# Patient Record
Sex: Female | Born: 1935 | Race: White | Hispanic: No | Marital: Married | Smoking: Never smoker
Health system: Southern US, Community
[De-identification: ages and names within clinical notes are randomized; demographics above are authoritative.]

## PROBLEM LIST (undated history)

## (undated) DIAGNOSIS — E079 Disorder of thyroid, unspecified: Secondary | ICD-10-CM

## (undated) DIAGNOSIS — E119 Type 2 diabetes mellitus without complications: Secondary | ICD-10-CM

## (undated) DIAGNOSIS — I10 Essential (primary) hypertension: Secondary | ICD-10-CM

## (undated) HISTORY — PX: ABDOMINAL HYSTERECTOMY: SHX81

## (undated) HISTORY — PX: BREAST BIOPSY: SHX20

---

## 2004-12-23 ENCOUNTER — Ambulatory Visit: Payer: Self-pay | Admitting: Gastroenterology

## 2005-04-30 ENCOUNTER — Ambulatory Visit: Payer: Self-pay | Admitting: Family Medicine

## 2005-06-01 ENCOUNTER — Ambulatory Visit: Payer: Self-pay | Admitting: Family Medicine

## 2006-02-03 ENCOUNTER — Ambulatory Visit: Payer: Self-pay | Admitting: Family Medicine

## 2006-06-10 ENCOUNTER — Ambulatory Visit: Payer: Self-pay | Admitting: Family Medicine

## 2006-12-06 ENCOUNTER — Ambulatory Visit: Payer: Self-pay | Admitting: Family Medicine

## 2007-01-24 ENCOUNTER — Ambulatory Visit: Payer: Self-pay | Admitting: Family Medicine

## 2007-01-27 ENCOUNTER — Inpatient Hospital Stay: Payer: Self-pay | Admitting: Internal Medicine

## 2007-01-27 ENCOUNTER — Other Ambulatory Visit: Payer: Self-pay

## 2007-03-04 ENCOUNTER — Ambulatory Visit: Payer: Self-pay | Admitting: Internal Medicine

## 2007-03-04 LAB — CONVERTED CEMR LAB
Basophils Relative: 0.6 % (ref 0.0–1.0)
Eosinophils Relative: 3 % (ref 0.0–5.0)
HCT: 38.2 % (ref 36.0–46.0)
Hemoglobin: 12.8 g/dL (ref 12.0–15.0)
Lymphocytes Relative: 25.7 % (ref 12.0–46.0)
Monocytes Absolute: 1 10*3/uL — ABNORMAL HIGH (ref 0.2–0.7)
Monocytes Relative: 12.9 % — ABNORMAL HIGH (ref 3.0–11.0)
Neutro Abs: 4.8 10*3/uL (ref 1.4–7.7)
Neutrophils Relative %: 57.8 % (ref 43.0–77.0)
RDW: 13.7 % (ref 11.5–14.6)
Sed Rate: 80 mm/hr — ABNORMAL HIGH (ref 0–25)
WBC: 8.1 10*3/uL (ref 4.5–10.5)

## 2007-03-11 ENCOUNTER — Ambulatory Visit: Payer: Self-pay | Admitting: Pulmonary Disease

## 2007-03-18 ENCOUNTER — Ambulatory Visit: Payer: Self-pay | Admitting: Internal Medicine

## 2007-04-13 ENCOUNTER — Ambulatory Visit: Payer: Self-pay | Admitting: Internal Medicine

## 2007-04-26 ENCOUNTER — Encounter: Payer: Self-pay | Admitting: Internal Medicine

## 2007-04-27 ENCOUNTER — Ambulatory Visit: Payer: Self-pay | Admitting: Internal Medicine

## 2007-06-08 ENCOUNTER — Ambulatory Visit: Payer: Self-pay | Admitting: Internal Medicine

## 2007-06-08 LAB — CONVERTED CEMR LAB
BUN: 40 mg/dL — ABNORMAL HIGH (ref 6–23)
CO2: 28 meq/L (ref 19–32)
Calcium: 10.1 mg/dL (ref 8.4–10.5)
Chloride: 103 meq/L (ref 96–112)
Creatinine, Ser: 1.5 mg/dL — ABNORMAL HIGH (ref 0.4–1.2)
GFR calc Af Amer: 44 mL/min
Glucose, Bld: 139 mg/dL — ABNORMAL HIGH (ref 70–99)

## 2007-06-22 ENCOUNTER — Ambulatory Visit: Payer: Self-pay | Admitting: Family Medicine

## 2007-06-24 ENCOUNTER — Ambulatory Visit: Payer: Self-pay | Admitting: Family Medicine

## 2007-07-20 ENCOUNTER — Ambulatory Visit: Payer: Self-pay | Admitting: Internal Medicine

## 2007-07-20 LAB — CONVERTED CEMR LAB
BUN: 34 mg/dL — ABNORMAL HIGH (ref 6–23)
CO2: 27 meq/L (ref 19–32)
Calcium: 9.2 mg/dL (ref 8.4–10.5)
Chloride: 107 meq/L (ref 96–112)
GFR calc Af Amer: 40 mL/min
Glucose, Bld: 252 mg/dL — ABNORMAL HIGH (ref 70–99)

## 2007-09-14 ENCOUNTER — Telehealth (INDEPENDENT_AMBULATORY_CARE_PROVIDER_SITE_OTHER): Payer: Self-pay | Admitting: *Deleted

## 2007-09-16 ENCOUNTER — Ambulatory Visit: Payer: Self-pay | Admitting: Internal Medicine

## 2007-10-12 DIAGNOSIS — J8410 Pulmonary fibrosis, unspecified: Secondary | ICD-10-CM | POA: Insufficient documentation

## 2007-10-12 DIAGNOSIS — I10 Essential (primary) hypertension: Secondary | ICD-10-CM | POA: Insufficient documentation

## 2007-10-12 DIAGNOSIS — J8409 Other alveolar and parieto-alveolar conditions: Secondary | ICD-10-CM | POA: Insufficient documentation

## 2007-10-12 DIAGNOSIS — R7309 Other abnormal glucose: Secondary | ICD-10-CM | POA: Insufficient documentation

## 2007-10-13 ENCOUNTER — Ambulatory Visit: Payer: Self-pay | Admitting: Internal Medicine

## 2007-10-13 LAB — CONVERTED CEMR LAB
BUN: 31 mg/dL — ABNORMAL HIGH (ref 6–23)
CO2: 23 meq/L (ref 19–32)
Calcium: 9.9 mg/dL (ref 8.4–10.5)
Chloride: 109 meq/L (ref 96–112)
GFR calc Af Amer: 40 mL/min
GFR calc non Af Amer: 33 mL/min

## 2007-12-14 ENCOUNTER — Ambulatory Visit: Payer: Self-pay | Admitting: Internal Medicine

## 2008-03-12 ENCOUNTER — Ambulatory Visit: Payer: Self-pay | Admitting: Internal Medicine

## 2008-03-13 LAB — CONVERTED CEMR LAB
Basophils Absolute: 0 10*3/uL (ref 0.0–0.1)
Basophils Relative: 0.5 % (ref 0.0–1.0)
Chloride: 105 meq/L (ref 96–112)
Creatinine, Ser: 1.6 mg/dL — ABNORMAL HIGH (ref 0.4–1.2)
Eosinophils Absolute: 0.2 10*3/uL (ref 0.0–0.7)
GFR calc non Af Amer: 33 mL/min
MCHC: 33.7 g/dL (ref 30.0–36.0)
MCV: 97.3 fL (ref 78.0–100.0)
Neutrophils Relative %: 66.2 % (ref 43.0–77.0)
Platelets: 221 10*3/uL (ref 150–400)
Potassium: 5.5 meq/L — ABNORMAL HIGH (ref 3.5–5.1)
Sed Rate: 49 mm/hr — ABNORMAL HIGH (ref 0–22)

## 2008-06-12 ENCOUNTER — Ambulatory Visit: Payer: Self-pay | Admitting: Internal Medicine

## 2008-06-12 DIAGNOSIS — E119 Type 2 diabetes mellitus without complications: Secondary | ICD-10-CM | POA: Insufficient documentation

## 2008-06-12 DIAGNOSIS — M899 Disorder of bone, unspecified: Secondary | ICD-10-CM | POA: Insufficient documentation

## 2008-06-12 DIAGNOSIS — M949 Disorder of cartilage, unspecified: Secondary | ICD-10-CM

## 2008-06-13 LAB — CONVERTED CEMR LAB
BUN: 42 mg/dL — ABNORMAL HIGH (ref 6–23)
Calcium: 9.7 mg/dL (ref 8.4–10.5)
GFR calc Af Amer: 38 mL/min
GFR calc non Af Amer: 31 mL/min
Potassium: 4.8 meq/L (ref 3.5–5.1)

## 2008-06-19 ENCOUNTER — Ambulatory Visit: Payer: Self-pay | Admitting: Internal Medicine

## 2008-07-17 ENCOUNTER — Ambulatory Visit: Payer: Self-pay | Admitting: Internal Medicine

## 2008-07-17 DIAGNOSIS — N189 Chronic kidney disease, unspecified: Secondary | ICD-10-CM | POA: Insufficient documentation

## 2008-08-16 ENCOUNTER — Ambulatory Visit: Payer: Self-pay | Admitting: Family Medicine

## 2008-09-05 ENCOUNTER — Ambulatory Visit: Payer: Self-pay | Admitting: Internal Medicine

## 2008-09-11 ENCOUNTER — Ambulatory Visit: Payer: Self-pay | Admitting: Physician Assistant

## 2008-10-17 ENCOUNTER — Ambulatory Visit: Payer: Self-pay | Admitting: Internal Medicine

## 2008-10-17 LAB — CONVERTED CEMR LAB
BUN: 28 mg/dL — ABNORMAL HIGH (ref 6–23)
Calcium: 9.5 mg/dL (ref 8.4–10.5)
Creatinine, Ser: 1.4 mg/dL — ABNORMAL HIGH (ref 0.4–1.2)
GFR calc Af Amer: 47 mL/min
Glucose, Bld: 224 mg/dL — ABNORMAL HIGH (ref 70–99)
Sodium: 136 meq/L (ref 135–145)

## 2008-11-14 ENCOUNTER — Ambulatory Visit: Payer: Self-pay | Admitting: Family Medicine

## 2008-11-29 ENCOUNTER — Ambulatory Visit: Payer: Self-pay | Admitting: Internal Medicine

## 2009-01-11 ENCOUNTER — Ambulatory Visit: Payer: Self-pay | Admitting: Internal Medicine

## 2009-02-28 IMAGING — CR DG CHEST 2V
2 series · 2 of 2 positions shown · non-contrast
Comparison: 03/12/2008

CLINICAL DATA: Follow up interstitial lung disease

CHEST - 2 VIEW

[view not recorded (1 of 2)]
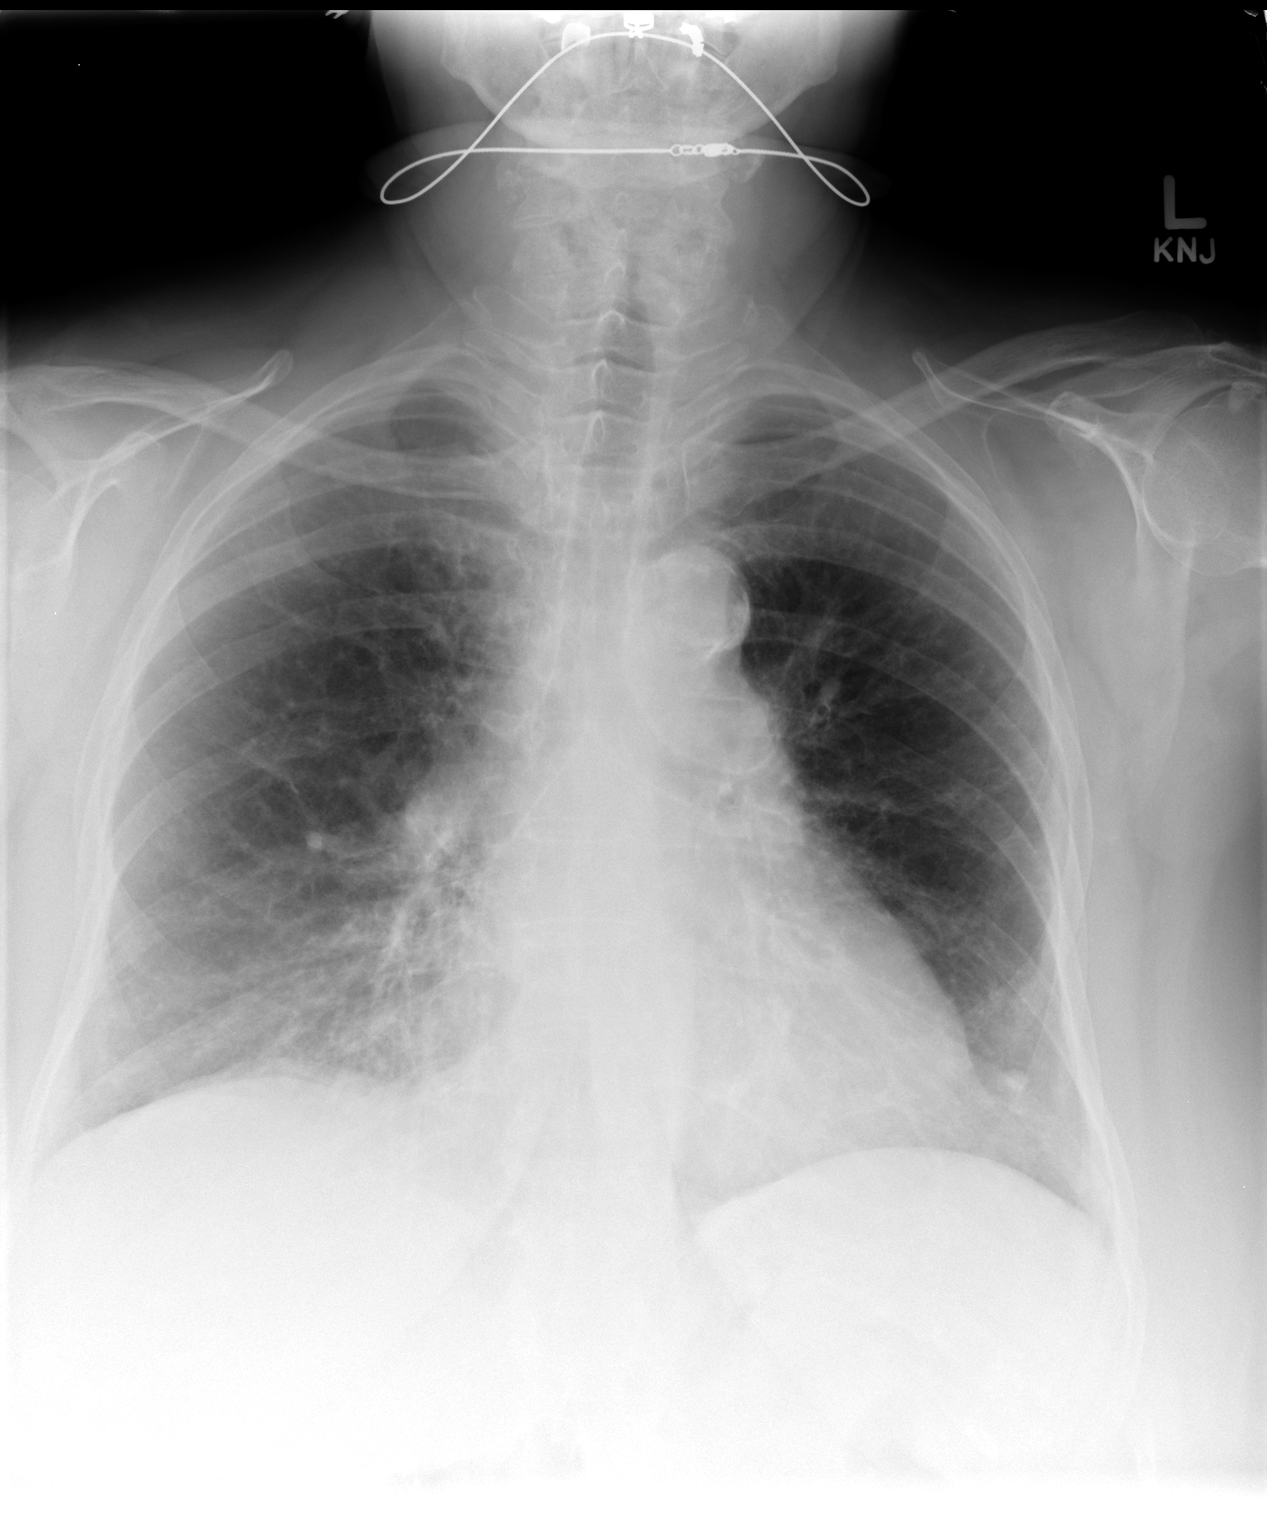

[view not recorded (2 of 2)]
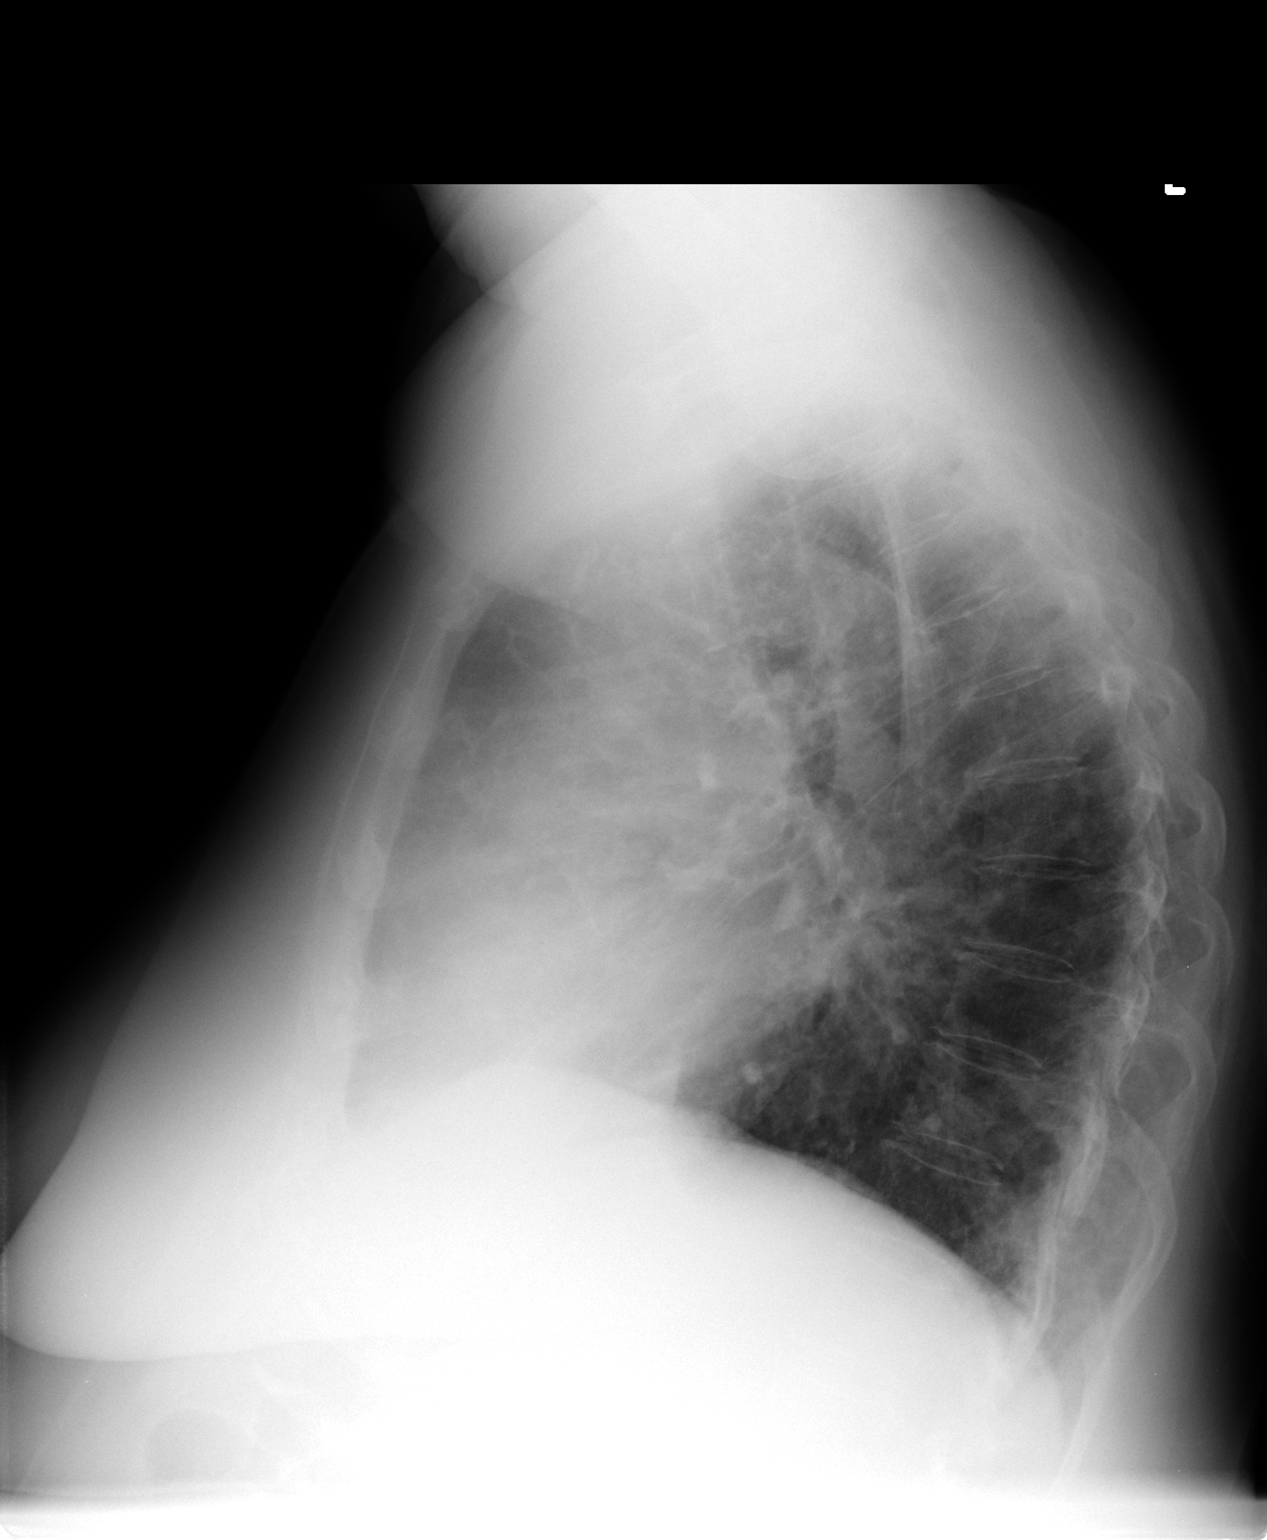

[2 of 2 positions shown; findings below may reference images not displayed]

FINDINGS: Heart size is enlarged.

There is no pleural effusion or interstitial edema.

Granuloma in the left lung base is noted.  This is similar to prior
exam.

Reticular interstitial opacities which are basilar predominant have
an appearance similar to prior exam.

No superimposed airspace consolidation noted.
IMPRESSION: 1.  No change in chronic interstitial lung disease.

REF:A1 DICTATED: 11/29/2008 [DATE]

## 2009-08-26 ENCOUNTER — Ambulatory Visit: Payer: Self-pay | Admitting: Family Medicine

## 2010-01-17 ENCOUNTER — Emergency Department: Payer: Self-pay | Admitting: Emergency Medicine

## 2010-11-11 ENCOUNTER — Ambulatory Visit: Payer: Self-pay | Admitting: Family Medicine

## 2011-03-10 NOTE — Assessment & Plan Note (Signed)
[facility]                             PULMONARY OFFICE NOTE   NAME:[name] [name]                           MRN:          [id number]  DATE:06/08/2007                            DOB:          June 08, 1929    HISTORY:  Patient is a 77 year old white female with a clinical  diagnosis of BOOP, associated with marked elevation of sed rate and a  dramatic improvement in symptoms of dyspnea and cough since started on  prednisone on Mar 12, 2007.  She has no limitations, except  intermittently her right hip slows her down.  She denies any significant  cough, dyspnea, fever, chills, sweats, orthopnea, or leg swelling.   For full list of her medications, please see face sheet, dated June 08, 2007, which correlates 100% with medication calendar she carries  with her.  She is using oxygen at bedtime only.  Her prednisone has now  been tapered down to 20 mg tablets taking a quarter daily.   PHYSICAL EXAMINATION:  She is a pleasant, ambulatory, white female with  minimal cushingoid features and no acute distress.  She is afebrile, stable vital signs.  Weight is 183 pounds, which is  only up 3 pounds from prior to prednisone use.  HEENT is unremarkable.  Oropharynx clear.  Lung fields are completely clear bilaterally to auscultation and  percussion.  There is regular rhythm without murmur, rub or gallop.  There is  increase in P2.  Abdomen is soft, benign.  Extremities are warm without calf tenderness, cyanosis, clubbing or  edema.   Hemoglobin saturation 94% on room air.   IMPRESSION:  Continued improvement, clinically, on minimum doses of  prednisone, as would be typical of BOOP, although it certainly could  flare as the prednisone is tapered off.  One complicating feature is  that she cannot exercise as much as she would like because of her  chronic right-hip pain, which may interfere with our ability to ask  about exercise tolerance.   I have reviewed  with her a reasonable strategy to taper prednisone  completely off, but for now would try one quarter tablet every other day  and return in six weeks.   Chest x-ray and sed rate are pending.     [name] Deem. Melvyn Novas, MD, [facility]  Electronically Signed    MBW/MedQ  DD: 06/08/2007  DT: 06/09/2007  Job #: [id number]   cc:   Dr. Antony Odea, [facility]

## 2011-03-10 NOTE — Assessment & Plan Note (Signed)
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  She was empirically started on prednisone on May 17, and  states that she is already feeling better with complete resolution of  cough and improving dyspnea to the point where she can make it to the  mailbox and back for the first time.  She does, however, struggle when  she takes her oxygen off.   For full information on medications, please see patient sheet, column  dated Mar 18, 2007, noting that it corresponds to the medication  calendar she carries with her which presents an unambiguous, very user  friendly medication administration record, and was generated by my nurse  practitioner on May 16.   PHYSICAL EXAMINATION:  She is a pleasant, ambulatory, obese white female  in no acute distress.  She is afebrile with normal vital signs.  HEENT:  Unremarkable.  Oropharynx clear.  Lung fields reveal a few crackles, right greater than left base.  Overall, there is much improvement in aeration.  Regular rate and rhythm without murmurs, gallops, or rubs.  ABDOMEN:  Obese but otherwise benign.  EXTREMITIES:  Warm without calf tenderness, cyanosis, clubbing, or  edema.   Heme saturation 93% on 2 L nasal prongs.   IMPRESSION:  1. Interstitial lung disease associated with crackles on exam, and      marked elevation of sed rate, classic for bronchiolitis obliterans      with organizing pneumonia.  I  spent extra time talking to the      patient and her son about the differential diagnosis and the most      likely explanation (idiopathic inflammation).  My hope is that,      based on how briskly she responded to prednisone, we can control      the disease on low doses of steroids.  The only other option is to      do a lung biopsy to justify more aggressive therapy with higher      doses of steroids.  I told her point blank that this may become      necessary if she does not continue to respond in a convincing      fashion to the empiric trial of prednisone she has presently      initiated.  2. Hypertension is well controlled having changed her medication      significantly since her previous visit to eliminate the use of      angiotensin-converting enzyme inhibitors and calcium channel      blockers.  She is  concerned about the side effects she read for      minoxidil, but for now, I believe she is doing so well on minoxidil      with no obvious side effects, that the only thing to look for would      be increasing leg swelling or hirsutism, which presently do not      apply.  If they do, I would not hesitate to rechallenge her with      calcium channel blockers in the form of Norvasc, Plendil or Sular.   Followup here will, therefore, be in 4 weeks with another sed rate and  chest x-ray, sooner if her condition deteriorates.     Christena Deem. Melvyn Novas, MD, [facility]  Electronically Signed    MBW/MedQ  DD: 03/18/2007  DT: 03/18/2007  Job #: [id number]   cc:   Gayland Curry, MD

## 2011-03-10 NOTE — Assessment & Plan Note (Signed)
[facility]                             PULMONARY OFFICE NOTE   NAME:[name] [name], [name] [name]                           MRN:          [id number]  DATE:04/27/2007                            DOB:          12-12-1928    HISTORY:  This is a 77 year old white female with a clinical diagnosis  of BOOP associated with marked elevation of sedimentation rate and now  dramatically improved, since prednisone was initiated on 5/17 to the  point where she does not feel that she needs oxygen anymore. She is able  to do pretty much what she wants to do in terms of function, although  obviously is not aerobically active.   She denies any ongoing fevers, chills, cough, orthopnea, PND, or leg  swelling.   For full information on medications, please see face sheet column dated  04/27/2007.   PHYSICAL EXAMINATION:  GENERAL:  She is a pleasant ambulatory moderately  obese white female in no acute distress.  VITAL SIGNS:  Blood pressure 138/74.  HEENT:  Normal. Oropharynx clear.  LUNGS:  Fields are perfectly clear bilaterally to auscultation and  percussion.  CARDIAC:  Regular rate and rhythm with no murmurs, rubs, or gallops.  ABDOMEN:  Soft and benign.  EXTREMITIES:  Warm without calf tenderness, cyanosis, clubbing, or  edema.   Heme saturation 96% on 2 liters, but 94% at rest. She was able to  ambulate now 3 full laps at the office (over almost 600 feet), but no  desaturation.   IMPRESSION:  1. No evidence of activity desaturation. I would like for her to      continue to wear the oxygen at night only until we are able to do      an overnight oximetry off of oxygen.  2. Her hypoxemia and interstitial lung disease associated with      elevated sedimentation rate are consistent with BOOP, albeit      without its tissue diagnosis. Therefore, we need to be very careful      that she tapers prednisone down, being on the lookout for worsening      dyspnea, cough, or desaturation.  For now, I recommended that she      try 1/2 tablet every day to see if the prednisone can be tapered      completely off at the next visit at six weeks.     Christena Deem. Melvyn Novas, MD, [facility]  Electronically Signed    MBW/MedQ  DD: 04/27/2007  DT: 04/28/2007  Job #: [id number]   cc:   Gayland Curry, MD

## 2011-03-10 NOTE — Assessment & Plan Note (Signed)
[facility]                             PULMONARY OFFICE NOTE   NAME:[name] [name], [name] [name]                           MRN:          [id number]  DATE:03/04/2007                            DOB:          1929/10/15    CHIEF COMPLAINT:  Dyspnea.   HISTORY:  A 77 year old, white female with relatively abrupt onset of  dyspnea about 6 months ago that was somewhat better after  hospitalization at [facility] for what she was told might be  pressures in her heart too high (presumably diastolic dysfunction). I  understand her cardiac workup was essentially negative however. She was  seen by Dr. Raul Del who thought she might have lupus but she did not  return for followup because he never called me back to tell me my  lupus results.   The patient denies any myalgias, arthralgias or sun sensitivity. She  does have a history of difficult to control hypertension but no history  of kidney failure to her knowledge. Classic orthopnea, PND or  significant leg swelling are not correlating with any of her complaints  of dyspnea. She also has a dry cough and chest discomfort during  coughing paroxysm.   She presently says she can walk in a grocery store as long as she has  oxygen but notes she was not oxygen-dependent before the onset of this  illness and quit smoking in 1962 felt fine then.   She denies any excess sputum production, fevers, chills, sweats, overt  sinus or reflux symptoms.   PAST MEDICAL HISTORY:  Significant for hypertension for which she is on  ACE inhibitors, diabetes, hyperlipidemia and she has had a history of  urinary tract infections but denies using Macrodantin to her knowledge.   ALLERGIES:  CODEINE and SULFA.   MEDICATIONS:  Enalapril, furosemide and nifedipine.  Please see  medication face sheet column dated Mar 04, 2007 for details. Apparently  she has tried inhalers before this but they did not help.   SOCIAL HISTORY:  She quit smoking  in 1962. She denies any unusual  travel, pet or hobby exposure.   FAMILY HISTORY:  Positive for cancer in sisters and mother, negative for  respiratory disease or atypia or rheumatologic problems.   REVIEW OF SYSTEMS:  Taken in detail on the worksheet and negative except  as outlined above.   PHYSICAL EXAMINATION:  GENERAL:  This is an obese, ambulatory, white  female in no acute distress.  VITAL SIGNS:  She had stable vital signs. She is wearing oxygen.  HEENT:  Unremarkable. Pharynx clear.  NECK:  Supple without cervical adenopathy or tenderness. The trachea is  midline, no thyromegaly.  LUNGS:  Lung fields reveal a few crackles in the right greater than left  base. Overall air movement excellent with no cough elicited on  inspiratory maneuvers.  HEART:  She has a regular rate and rhythm without murmur, gallop or rub  or increase in P2.  ABDOMEN:  Obese, soft and benign.  EXTREMITIES:  Warm without calf tenderness, cyanosis, clubbing or edema.   LABORATORY DATA:  Lab  data today indicated a sed rate of 80. CBC was  normal with no significant eosinophilia. BNP was 20.   Chest x-ray showed bilateral infiltrates, right greater than left.   IMPRESSION:  Classic bronchiolitis obliterans with organized pneumonia  by history and exam. The x-ray looks quite severe relative to the exam  but she has no clubbing and I suspect the reason she transiently  improved during her hospitalization was that she was given a short  course of prednisone, although she denies any knowledge of this.   Although she may have a component of diastolic congestive heart failure,  a BNP of 20 with a sed rate of 80 virtually rules out hydrostatic  interstitial disease in favor strongly of inflammatory lung disease. In  this setting it is not uncommon to see positive lupus and rheumatoid  factors but she has no other findings to suggest an underlying  rheumatologic disease.   RECOMMENDATIONS:  I am going to  start by trying to get a better baseline  and rapport with this patient and first address those medications that  may be contributing to her symptomatology. Specifically, I am going to  ask her to stop ACE inhibitors in favor of Benicar 40 mg 1 daily, stop  nifedipine (which interferes with hypoxic pulmonary vasoconstriction)  and switch her to minoxidil 5 mg daily (she can double it if her blood  pressure is too high, she can stop it if her blood pressure is too low)  and add Pepcid AC one in the morning and one at bedtime to suppress the  possibility of acid reflux associated with any of her problems (although  I certainly think it is unlikely here and note that she has already  taken Pepcid [facility]  and she might as well take it the best way to suppress  nocturnal acid in the setting of obesity with interstitial lung  disease).   One problem that I encountered is that the patient and her son are dead  set against prednisone because they saw her husband take it and it did  him no good. I suspect that she will have a dramatic response to  prednisone if she will allow [place] to use it. It will be difficult to  overcome this bias unless I first develop a rapport with her.   She remains O2 dependent for hypoxic respiratory failure and I have  asked her to continue this until she returns in 1 week for medication  reconciliation and 2 weeks later will see her later for  consideration for prednisone therapy. In the meantime, I will try to  obtain the records from [facility] and Dr. Gust Brooms office.     Christena Deem. Melvyn Novas, MD, [facility]  Electronically Signed    MBW/MedQ  DD: 03/04/2007  DT: 03/05/2007  Job #: [id number]   cc:   Dr. Roney Marion, [place], [place]

## 2011-03-10 NOTE — Assessment & Plan Note (Signed)
[facility]                             PULMONARY OFFICE NOTE   NAME:Quesenberry, Uriah                           MRN:          [id number]  DATE:04/13/2007                            DOB:          08-02-1929    HISTORY:  77 year-old white female with a presumptive  diagnosis of bronchiolitis that borderlines with organized pneumonia  associated with marked elevation in sed rate who is dramatically better  symptomatically with no cough or dyspnea on prednisone that has now been  tapered down to 20 mg per day.  She has found nonfasting blood sugars as  high as 600, associated with blurred vision and has increased glyburide  up to two daily on that basis.  She came in today for further  instruction.   For full list of medications please see face sheet, dated April 13, 2007.   PHYSICAL EXAMINATION:  GENERAL:  She is a pleasant, ambulatory white  female who looks actually quite good.  VITAL SIGNS:  Normal.  HEENT:  Oropharynx clear.  LUNG FIELDS:  Completely clear bilaterally by auscultation and  percussion with no crackles on inspiration or any wheezes on expiration  or coughing on either maneuver.  HEART:  Regular rhythm without murmur, gallop or rub.  ABDOMEN:  Soft, benign.  EXTREMITIES:  Warm without any calf tenderness. No clubbing, cyanosis or  edema.   IMPRESSION:  Hyperglycemia secondary to steroids.  The only blood sugar  that counts is the one that occurs before breakfast and supper.  Using a  strategy of  loose control because she is doing so much better, I plan to taper the  prednisone off.  As long as she does not become dehydrated she should do  fine on the following simplified regimen:   1. Increase glyburide to 500 mg tablets two b.i.d.  2. Continue to check blood sugars before eating breakfast, and before      eating supper and call us if over 80 or consistently greater than      400.  3. In the meantime I encouraged her to  drink plenty of non      carbohydrate containing fluids and eliminate starches and sweets      from her diet.  4. She has complained of mild visual blurring that is now resolved and      denies any overt polyuria, polydipsia, polyphagia, or nausea.     Christena Deem. Melvyn Novas, MD, [facility]  Electronically Signed    MBW/MedQ  DD: 04/13/2007  DT: 04/13/2007  Job #: [id number]   cc:   Gayland Curry, MD

## 2011-03-10 NOTE — Assessment & Plan Note (Signed)
[facility]                             PULMONARY OFFICE NOTE   NAME:[name], [name]                           MRN:          [id number]  DATE:03/11/2007                            DOB:          05-30-1929    HISTORY OF PRESENT ILLNESS:  The patient is a 77 year old white female  patient of Dr. Gustavus [name] who was recently seen for pulmonary consult for a  6 month history of abrupt onset of dyspnea.  The patient's chest x-ray  shows bilateral infiltrates, right greater than left, with an elevated  sed rate of 80 and a BNP of 20.  A CBC was essentially normal with no  significant eosinophilia.  The patient was felt to have classic  bronchiolitis obliterans with organized pneumonia.  However, patient was  very hesitant on starting steroids.  The patient was felt to also have  some upper airway instability and was discontinued off her ACE  inhibitors and given Benicar 40 mg.  She was also switched off of  nifedipine over to minoxidil and treated aggressively for reflux with  Zantac twice daily.  The patient has returned today reporting that she  continues to be quite short of breath, however is improved on continuous  oxygen therapy.  The patient has brought all of her medication in for  review.  The patient is on multiple medications and is somewhat confused  about exactly what medicines she is supposed to be taking at what time.  The patient denies any chest pain, purulent sputum, fever, chest pain,  orthopnea, PND, or leg swelling.   PAST MEDICAL HISTORY:  Reviewed.   CURRENT MEDICATIONS:  Reviewed.   PHYSICAL EXAMINATION:  The patient is a female in no acute distress, She  is afebrile with stable vital signs.  O2 saturation is 96% on 2 liters.  HEENT:  Unremarkable.  NECK:  Supple without cervical adenopathy.  No JVD.  LUNGS:  Sounds reveal a few bibasilar crackles.  CARDIAC:  Regular rate.  ABDOMEN:  Soft and nontender.  EXTREMITIES:  Warm without any  edema.   IMPRESSION AND PLAN:  1. 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  The      patient has been recommended to begin on prednisone at 40 mg,      tapering down by 10 mg every 4 days until she reaches a baseline of      20 mg daily and hold.  She will recheck here in 2 weeks with Dr.      Melvyn [name] or sooner if needed.  The patient does have a history of      diabetes mellitus, has been asked to call our office if blood      sugars get above 200.  2. Complex medication regimen.  Patient medication reviewed in detail.      Patient education      was provided.  A computerized medication calendar was completed for      this patient and reviewed in detail.      Rexene Edison, NP  Electronically Signed  Christena Deem. [name] Novas, MD, [facility]  Electronically Signed   TP/MedQ  DD: 03/15/2007  DT: 03/15/2007  Job #: [id number]

## 2011-03-10 NOTE — Assessment & Plan Note (Signed)
[facility]                             PULMONARY OFFICE NOTE   NAME:Merry, Selda                           MRN:          [id number]  DATE:07/20/2007                            DOB:          May 30, 1929    HISTORY:  A 77 year old white female with a presumed diagnosis of BOOP  dating back to the winter of 2008 and on prednisone daily since May  empirically with a marked elevated sed rate and an convincing response  to therapy.  She previously had a dry cough as well and all of her  symptoms totally resolved on prednisone which she has been able to taper  down to 20 mg tablets 1/4 every other day.   Her main concern at this point is that she is developing facial hair  from minoxidil.  She denies any significant leg swelling, decrease in  activity tolerance, fevers, chills, sweats, significant cough, chest  pain, myalgias, arthralgias except for the right hip which previously  was hurting years ago and has been hurting a little bit more since she  reduced prednisone dosage.   PHYSICAL EXAMINATION:  GENERAL:  She is a pleasant, ambulatory white  female in no acute distress.  VITAL SIGNS:  Afebrile. Normal vital signs.  HEENT:  Unremarkable.  Oropharynx is clear.  CHEST:  Lung fields are completely clear bilaterally to auscultation and  percussion with no significant pops or squeaks or crackles on  inspiration.  HEART:  Regular rhythm without murmur, gallop or rub.  ABDOMEN:  Soft.  Benign.  EXTREMITIES:  Warm without calf tenderness, cyanosis or clubbing or  edema.   LABORATORY DATA:  Hemoglobin saturation 92% on room air.  Chest x-ray is  reviewed from her previous visit in August and overall shows marked  improvement in aeration on the right.   IMPRESSION:  1. This patient clearly has a steroid responsive form of interstitial      lung disease associated with crackles and most likely had BOOP      (bronchiolitis obliterans-organized pneumonia).   She has only been      on prednisone since May but was exquisitely prednisone responsive.      I plan to repeat a sed rate today and taper her prednisone to more      physiologic dose of 2.5 mg every other day for the next six weeks      before returning here and perhaps stopping it then.  I  did advise      her to go back to the previous dose of prednisone that controlled      her symptoms should she flare as she tapers.  2. Hypertension is adequately controlled but the patient is      complaining of increased facial hair. Note that a pulse rate of 78      is not really adequate beta blockade, so I am going to increase the      Metoprolol to twice daily, ask her to reduce the minoxidil to 1/2      daily and made these changes on her  medication calendar.  3. I reviewed these recommendations in writing with her using the      medication calendar to show her how easy it is to modify the      regimen and did so in red so she could pick up the changes when she      goes home.   A BMET was obtained today because of previous studies indicated a  potassium of 5.8 on Benicar and a creatinine of 1.5, question baseline.     Christena Deem. Melvyn Novas, MD, [facility]  Electronically Signed    MBW/MedQ  DD: 07/20/2007  DT: 07/21/2007  Job #: [id number]   cc:   Maryan Puls

## 2011-03-10 NOTE — Assessment & Plan Note (Signed)
[facility]                             PULMONARY OFFICE NOTE   NAME:[name] [name], [name] [name]                           MRN:          [id number]  DATE:09/16/2007                            DOB:          1929-01-27    HISTORY OF PRESENT ILLNESS:  Patient is a 77 year old white female  patient of Dr. 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  Patient complains over the last two weeks, the  patient has had increased shortness of breath with activity, productive  cough with thick yellowish-white sputum.  Patient denies any fever,  chest pain, hemoptysis, orthopnea, PND, or leg swelling.  The patient  had been on prednisone 2.5 mg alternating every other day.  Patient  reports that once she had decreased her prednisone down to this dose,  her shortness of breath had worsened.   PAST MEDICAL HISTORY:  Reviewed.   CURRENT MEDICATIONS:  Reviewed.   PHYSICAL EXAMINATION:  Patient is a pleasant female in no acute  distress.  She is afebrile with stable vital signs.  HEENT:  Unremarkable.  NECK:  Supple without cervical adenopathy.  No JVD.  LUNGS:  The lung sounds reveal some bibasilar crackles.  CARDIAC:  Regular rate and rhythm.  ABDOMEN:  Soft, nontender.  EXTREMITIES:  Warm without any edema.   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  Patient is to increase prednisone up to 10 mg daily x5  days, decreasing down to 5 mg x5 days, then 5 mg every other day, then  hold.  Follow up in three weeks with Dr. Melvyn Novas or sooner as needed.      Rexene Edison, NP  Electronically Signed      Christena Deem. Melvyn Novas, MD, [facility]  Electronically Signed   TP/MedQ  DD: 09/16/2007  DT:  09/17/2007  Job #: [id number]

## 2011-12-18 ENCOUNTER — Ambulatory Visit: Payer: Self-pay | Admitting: Cardiology

## 2011-12-23 ENCOUNTER — Ambulatory Visit: Payer: Self-pay | Admitting: Family Medicine

## 2012-05-01 ENCOUNTER — Emergency Department: Payer: Self-pay | Admitting: Emergency Medicine

## 2012-05-01 LAB — COMPREHENSIVE METABOLIC PANEL
Anion Gap: 6 — ABNORMAL LOW (ref 7–16)
BUN: 26 mg/dL — ABNORMAL HIGH (ref 7–18)
Bilirubin,Total: 0.7 mg/dL (ref 0.2–1.0)
Chloride: 101 mmol/L (ref 98–107)
Creatinine: 1.56 mg/dL — ABNORMAL HIGH (ref 0.60–1.30)
EGFR (African American): 36 — ABNORMAL LOW
Osmolality: 283 (ref 275–301)
Potassium: 4.5 mmol/L (ref 3.5–5.1)
Total Protein: 7.2 g/dL (ref 6.4–8.2)

## 2012-05-01 LAB — URINALYSIS, COMPLETE
Bilirubin,UR: NEGATIVE
Ketone: NEGATIVE
RBC,UR: 3 /HPF (ref 0–5)
Squamous Epithelial: 5
Transitional Epi: 3
WBC UR: 83 /HPF (ref 0–5)

## 2012-05-01 LAB — CBC
HCT: 38.3 % (ref 35.0–47.0)
MCH: 31.9 pg (ref 26.0–34.0)
MCV: 99 fL (ref 80–100)
Platelet: 126 10*3/uL — ABNORMAL LOW (ref 150–440)
RDW: 17.3 % — ABNORMAL HIGH (ref 11.5–14.5)

## 2012-05-31 ENCOUNTER — Emergency Department: Payer: Self-pay | Admitting: Emergency Medicine

## 2012-05-31 LAB — COMPREHENSIVE METABOLIC PANEL
Alkaline Phosphatase: 126 U/L (ref 50–136)
BUN: 21 mg/dL — ABNORMAL HIGH (ref 7–18)
Bilirubin,Total: 1.1 mg/dL — ABNORMAL HIGH (ref 0.2–1.0)
Creatinine: 1.2 mg/dL (ref 0.60–1.30)
EGFR (African American): 49 — ABNORMAL LOW
EGFR (Non-African Amer.): 43 — ABNORMAL LOW
Glucose: 123 mg/dL — ABNORMAL HIGH (ref 65–99)
Osmolality: 274 (ref 275–301)
SGOT(AST): 52 U/L — ABNORMAL HIGH (ref 15–37)
SGPT (ALT): 15 U/L (ref 12–78)
Sodium: 135 mmol/L — ABNORMAL LOW (ref 136–145)

## 2012-05-31 LAB — CBC
HGB: 13.2 g/dL (ref 12.0–16.0)
MCH: 31.9 pg (ref 26.0–34.0)
RBC: 4.14 10*6/uL (ref 3.80–5.20)
WBC: 12.5 10*3/uL — ABNORMAL HIGH (ref 3.6–11.0)

## 2012-05-31 LAB — URINALYSIS, COMPLETE
Bilirubin,UR: NEGATIVE
Blood: NEGATIVE
Hyaline Cast: 1
Nitrite: NEGATIVE
Ph: 5 (ref 4.5–8.0)
Protein: NEGATIVE
RBC,UR: <1 /HPF (ref 0–5)

## 2013-01-11 ENCOUNTER — Ambulatory Visit: Payer: Self-pay | Admitting: Family Medicine

## 2014-03-01 ENCOUNTER — Ambulatory Visit: Payer: Self-pay | Admitting: Family Medicine

## 2015-02-08 ENCOUNTER — Other Ambulatory Visit: Payer: Self-pay | Admitting: Family Medicine

## 2015-02-08 DIAGNOSIS — Z23 Encounter for immunization: Secondary | ICD-10-CM

## 2015-03-22 ENCOUNTER — Ambulatory Visit: Payer: Self-pay | Attending: Family Medicine

## 2015-04-04 ENCOUNTER — Other Ambulatory Visit: Payer: Self-pay | Admitting: Family Medicine

## 2015-04-04 ENCOUNTER — Ambulatory Visit
Admission: RE | Admit: 2015-04-04 | Discharge: 2015-04-04 | Disposition: A | Discharge status: Home / Self Care | Payer: Medicare Other | Source: Ambulatory Visit | Attending: Family Medicine | Admitting: Family Medicine

## 2015-04-04 DIAGNOSIS — Z1231 Encounter for screening mammogram for malignant neoplasm of breast: Secondary | ICD-10-CM | POA: Insufficient documentation

## 2015-04-04 DIAGNOSIS — Z23 Encounter for immunization: Secondary | ICD-10-CM

## 2015-11-26 ENCOUNTER — Other Ambulatory Visit: Payer: Self-pay | Admitting: Ophthalmology

## 2015-11-26 DIAGNOSIS — H5340 Unspecified visual field defects: Secondary | ICD-10-CM

## 2015-11-28 ENCOUNTER — Other Ambulatory Visit: Payer: Self-pay | Admitting: Ophthalmology

## 2015-11-28 DIAGNOSIS — H5340 Unspecified visual field defects: Secondary | ICD-10-CM

## 2015-11-29 ENCOUNTER — Ambulatory Visit
Admission: RE | Admit: 2015-11-29 | Discharge: 2015-11-29 | Disposition: A | Discharge status: Home / Self Care | Payer: Medicare HMO | Source: Ambulatory Visit | Attending: Ophthalmology | Admitting: Ophthalmology

## 2015-12-17 ENCOUNTER — Ambulatory Visit
Admission: RE | Admit: 2015-12-17 | Discharge: 2015-12-17 | Disposition: A | Discharge status: Home / Self Care | Payer: Medicare HMO | Source: Ambulatory Visit | Attending: Ophthalmology | Admitting: Ophthalmology

## 2015-12-17 ENCOUNTER — Other Ambulatory Visit
Admission: AD | Admit: 2015-12-17 | Discharge: 2015-12-17 | Disposition: A | Discharge status: Home / Self Care | Payer: Medicare HMO | Source: Ambulatory Visit | Attending: Ophthalmology | Admitting: Ophthalmology

## 2015-12-17 ENCOUNTER — Other Ambulatory Visit: Payer: Self-pay | Admitting: Ophthalmology

## 2015-12-17 DIAGNOSIS — I739 Peripheral vascular disease, unspecified: Secondary | ICD-10-CM | POA: Diagnosis not present

## 2015-12-17 DIAGNOSIS — H5340 Unspecified visual field defects: Secondary | ICD-10-CM | POA: Insufficient documentation

## 2015-12-17 LAB — CREATININE, SERUM
CREATININE: 1.71 mg/dL — AB (ref 0.44–1.00)
GFR calc Af Amer: 30 mL/min — ABNORMAL LOW (ref >60)
GFR calc non Af Amer: 26 mL/min — ABNORMAL LOW (ref >60)

## 2016-02-25 ENCOUNTER — Other Ambulatory Visit: Payer: Self-pay | Admitting: Family Medicine

## 2016-02-25 DIAGNOSIS — Z1231 Encounter for screening mammogram for malignant neoplasm of breast: Secondary | ICD-10-CM

## 2016-04-20 ENCOUNTER — Other Ambulatory Visit: Payer: Self-pay | Admitting: Family Medicine

## 2016-04-20 ENCOUNTER — Ambulatory Visit
Admission: RE | Admit: 2016-04-20 | Discharge: 2016-04-20 | Disposition: A | Discharge status: Home / Self Care | Payer: Medicare HMO | Source: Ambulatory Visit | Attending: Family Medicine | Admitting: Family Medicine

## 2016-04-20 DIAGNOSIS — Z1231 Encounter for screening mammogram for malignant neoplasm of breast: Secondary | ICD-10-CM

## 2016-07-19 ENCOUNTER — Emergency Department: Payer: Medicare HMO

## 2016-07-19 ENCOUNTER — Inpatient Hospital Stay
Admission: EM | Admit: 2016-07-19 | Discharge: 2016-07-22 | DRG: 871 | Disposition: A | Discharge status: Home Health | Payer: Medicare HMO | Attending: Internal Medicine | Admitting: Internal Medicine

## 2016-07-19 DIAGNOSIS — N183 Chronic kidney disease, stage 3 (moderate): Secondary | ICD-10-CM | POA: Diagnosis present

## 2016-07-19 DIAGNOSIS — Z953 Presence of xenogenic heart valve: Secondary | ICD-10-CM

## 2016-07-19 DIAGNOSIS — I130 Hypertensive heart and chronic kidney disease with heart failure and stage 1 through stage 4 chronic kidney disease, or unspecified chronic kidney disease: Secondary | ICD-10-CM | POA: Diagnosis present

## 2016-07-19 DIAGNOSIS — I248 Other forms of acute ischemic heart disease: Secondary | ICD-10-CM | POA: Diagnosis present

## 2016-07-19 DIAGNOSIS — J45909 Unspecified asthma, uncomplicated: Secondary | ICD-10-CM | POA: Diagnosis present

## 2016-07-19 DIAGNOSIS — R531 Weakness: Secondary | ICD-10-CM | POA: Diagnosis not present

## 2016-07-19 DIAGNOSIS — J9601 Acute respiratory failure with hypoxia: Secondary | ICD-10-CM | POA: Diagnosis present

## 2016-07-19 DIAGNOSIS — N189 Chronic kidney disease, unspecified: Secondary | ICD-10-CM | POA: Diagnosis present

## 2016-07-19 DIAGNOSIS — E1122 Type 2 diabetes mellitus with diabetic chronic kidney disease: Secondary | ICD-10-CM | POA: Diagnosis present

## 2016-07-19 DIAGNOSIS — J189 Pneumonia, unspecified organism: Secondary | ICD-10-CM | POA: Diagnosis present

## 2016-07-19 DIAGNOSIS — A419 Sepsis, unspecified organism: Principal | ICD-10-CM | POA: Diagnosis present

## 2016-07-19 DIAGNOSIS — R7989 Other specified abnormal findings of blood chemistry: Secondary | ICD-10-CM

## 2016-07-19 DIAGNOSIS — K219 Gastro-esophageal reflux disease without esophagitis: Secondary | ICD-10-CM | POA: Diagnosis present

## 2016-07-19 DIAGNOSIS — N184 Chronic kidney disease, stage 4 (severe): Secondary | ICD-10-CM | POA: Diagnosis present

## 2016-07-19 DIAGNOSIS — M109 Gout, unspecified: Secondary | ICD-10-CM | POA: Diagnosis present

## 2016-07-19 DIAGNOSIS — Z7982 Long term (current) use of aspirin: Secondary | ICD-10-CM

## 2016-07-19 DIAGNOSIS — E119 Type 2 diabetes mellitus without complications: Secondary | ICD-10-CM

## 2016-07-19 DIAGNOSIS — I10 Essential (primary) hypertension: Secondary | ICD-10-CM | POA: Diagnosis present

## 2016-07-19 DIAGNOSIS — Z794 Long term (current) use of insulin: Secondary | ICD-10-CM

## 2016-07-19 DIAGNOSIS — R4182 Altered mental status, unspecified: Secondary | ICD-10-CM | POA: Diagnosis present

## 2016-07-19 DIAGNOSIS — Z882 Allergy status to sulfonamides status: Secondary | ICD-10-CM

## 2016-07-19 DIAGNOSIS — J8489 Other specified interstitial pulmonary diseases: Secondary | ICD-10-CM | POA: Diagnosis present

## 2016-07-19 DIAGNOSIS — R778 Other specified abnormalities of plasma proteins: Secondary | ICD-10-CM

## 2016-07-19 DIAGNOSIS — I5033 Acute on chronic diastolic (congestive) heart failure: Secondary | ICD-10-CM | POA: Diagnosis present

## 2016-07-19 DIAGNOSIS — R262 Difficulty in walking, not elsewhere classified: Secondary | ICD-10-CM

## 2016-07-19 DIAGNOSIS — N170 Acute kidney failure with tubular necrosis: Secondary | ICD-10-CM | POA: Diagnosis present

## 2016-07-19 DIAGNOSIS — T380X5A Adverse effect of glucocorticoids and synthetic analogues, initial encounter: Secondary | ICD-10-CM | POA: Diagnosis present

## 2016-07-19 DIAGNOSIS — M6281 Muscle weakness (generalized): Secondary | ICD-10-CM

## 2016-07-19 DIAGNOSIS — Z885 Allergy status to narcotic agent status: Secondary | ICD-10-CM

## 2016-07-19 DIAGNOSIS — E1165 Type 2 diabetes mellitus with hyperglycemia: Secondary | ICD-10-CM | POA: Diagnosis present

## 2016-07-19 LAB — URINALYSIS COMPLETE WITH MICROSCOPIC (ARMC ONLY)
Bacteria, UA: NONE SEEN
Bilirubin Urine: NEGATIVE
Glucose, UA: NEGATIVE mg/dL
KETONES UR: NEGATIVE mg/dL
Nitrite: NEGATIVE
PROTEIN: 100 mg/dL — AB
SPECIFIC GRAVITY, URINE: 1.02 (ref 1.005–1.030)
pH: 5 (ref 5.0–8.0)

## 2016-07-19 LAB — COMPREHENSIVE METABOLIC PANEL
ALBUMIN: 3.2 g/dL — AB (ref 3.5–5.0)
ALK PHOS: 67 U/L (ref 38–126)
ALT: 11 U/L — ABNORMAL LOW (ref 14–54)
ANION GAP: 11 (ref 5–15)
AST: 27 U/L (ref 15–41)
BUN: 28 mg/dL — ABNORMAL HIGH (ref 6–20)
CALCIUM: 8.9 mg/dL (ref 8.9–10.3)
CO2: 21 mmol/L — AB (ref 22–32)
Chloride: 102 mmol/L (ref 101–111)
Creatinine, Ser: 1.69 mg/dL — ABNORMAL HIGH (ref 0.44–1.00)
GFR calc Af Amer: 31 mL/min — ABNORMAL LOW (ref >60)
GFR calc non Af Amer: 26 mL/min — ABNORMAL LOW (ref >60)
GLUCOSE: 145 mg/dL — AB (ref 65–99)
POTASSIUM: 4.3 mmol/L (ref 3.5–5.1)
SODIUM: 134 mmol/L — AB (ref 135–145)
Total Bilirubin: 1 mg/dL (ref 0.3–1.2)
Total Protein: 7.5 g/dL (ref 6.5–8.1)

## 2016-07-19 LAB — TROPONIN I: Troponin I: 0.09 ng/mL (ref <0.03)

## 2016-07-19 LAB — CBC WITH DIFFERENTIAL/PLATELET
Basophils Absolute: 0.1 10*3/uL (ref 0–0.1)
Basophils Relative: 0 %
Eosinophils Absolute: 0 10*3/uL (ref 0–0.7)
Eosinophils Relative: 0 %
HEMATOCRIT: 39.5 % (ref 35.0–47.0)
Hemoglobin: 13.1 g/dL (ref 12.0–16.0)
LYMPHS ABS: 2 10*3/uL (ref 1.0–3.6)
LYMPHS PCT: 12 %
MCH: 33.2 pg (ref 26.0–34.0)
MCHC: 33.2 g/dL (ref 32.0–36.0)
MCV: 100 fL (ref 80.0–100.0)
MONO ABS: 1.6 10*3/uL — AB (ref 0.2–0.9)
MONOS PCT: 9 %
NEUTROS ABS: 14 10*3/uL — AB (ref 1.4–6.5)
Neutrophils Relative %: 79 %
Platelets: 122 10*3/uL — ABNORMAL LOW (ref 150–440)
RBC: 3.95 MIL/uL (ref 3.80–5.20)
RDW: 15.6 % — AB (ref 11.5–14.5)
WBC: 17.7 10*3/uL — ABNORMAL HIGH (ref 3.6–11.0)

## 2016-07-19 LAB — LACTIC ACID, PLASMA: Lactic Acid, Venous: 2 mmol/L (ref 0.5–1.9)

## 2016-07-19 MED ORDER — CEFTRIAXONE SODIUM 1 G IJ SOLR
1.0000 g | Freq: Once | INTRAMUSCULAR | Status: AC
  Administered 2016-07-19: 1 g via INTRAVENOUS
  Filled 2016-07-19: qty 10

## 2016-07-19 MED ORDER — AZITHROMYCIN 500 MG PO TABS
500.0000 mg | ORAL_TABLET | Freq: Once | ORAL | Status: AC
  Administered 2016-07-19: 500 mg via ORAL
  Filled 2016-07-19: qty 1

## 2016-07-19 MED ORDER — SODIUM CHLORIDE 0.9 % IV BOLUS (SEPSIS)
500.0000 mL | Freq: Once | INTRAVENOUS | Status: AC
  Administered 2016-07-19: 500 mL via INTRAVENOUS

## 2016-07-19 MED ORDER — SODIUM CHLORIDE 0.9 % IV BOLUS (SEPSIS)
1000.0000 mL | Freq: Once | INTRAVENOUS | Status: AC
  Administered 2016-07-19: 1000 mL via INTRAVENOUS

## 2016-07-19 NOTE — ED Triage Notes (Signed)
Patient to the ED with family.  Reports elevated blood sugars at home, some altered mental status, nausea and weakness.

## 2016-07-19 NOTE — ED Notes (Signed)
Pt resting quietly, cont to monitor °

## 2016-07-19 NOTE — ED Provider Notes (Addendum)
[facility] Emergency Department Provider Note  ____________________________________________  Time seen: Approximately 8:21 PM  I have reviewed the triage vital signs and the nursing notes.   HISTORY  Chief Complaint Altered Mental Status; Nausea; Weakness; and Code Sepsis   HPI [name] [name] is a 82 y.o. female h/o CKD, DM, HTN, ILD who presents for generalized weakness, cough, and fever. According to patient's son she has had a cough for 2 weeks. Son lives with patient and has had cough and congestion as well. The last three days patient started to become progressively more weak and tired. Today barely got up from bed and son noticed that she was confused about her medications that's when he decided it was time to come to the doctor. He has not noticed any fevers at home. Patient reports that she has been urinating a lot and coughing a lot at home. She denies chest pain or shortness of breath, abdominal pain, nausea or vomiting. She does endorse decreased appetite and generalized fatigue.  No past medical history on file.  Patient Active Problem List   Diagnosis Date Noted  . RENAL FAILURE, CHRONIC 07/17/2008  . DIABETES MELLITUS, TYPE II 06/12/2008  . OSTEOPENIA 06/12/2008  . HYPERTENSION 10/12/2007  . INTERSTITIAL LUNG DISEASE 10/12/2007  . OTH SPEC ALVEOL&PARIETOALVEOL PNEUMONOPATHIES 10/12/2007  . HYPERGLYCEMIA 10/12/2007    Past Surgical History:  Procedure Laterality Date  . ABDOMINAL HYSTERECTOMY    . BREAST BIOPSY Right 20+yrs ago   negative    Prior to Admission medications   Not on File    Allergies Codeine and Sulfonamide derivatives  Family History  Problem Relation Age of Onset  . Breast cancer Mother 43    Social History Social History  Substance Use Topics  . Smoking status: Not on file  . Smokeless tobacco: Not on file  . Alcohol use Not on file    Review of Systems  Constitutional: + fever, malaise, and  generalized weakness Eyes: Negative for visual changes. ENT: Negative for sore throat. Cardiovascular: Negative for chest pain. Respiratory: Negative for shortness of breath. + cough Gastrointestinal: Negative for abdominal pain, vomiting or diarrhea. Genitourinary: Negative for dysuria. + frequency Musculoskeletal: Negative for back pain. Skin: Negative for rash. Neurological: Negative for headaches, weakness or numbness.  ____________________________________________   PHYSICAL EXAM:  VITAL SIGNS: ED Triage Vitals  Enc Vitals Group     BP 07/19/16 1955 (!) 152/86     Pulse Rate 07/19/16 1955 86     Resp 07/19/16 1955 (!) 24     Temp 07/19/16 1955 (!) 101.2 F (38.4 C)     Temp Source 07/19/16 1955 Oral     SpO2 07/19/16 1955 90 %     Weight 07/19/16 1952 180 lb (81.6 kg)     Height 07/19/16 1952 5\' 4"  (1.626 m)     Head Circumference --      Peak Flow --      Pain Score --      Pain Loc --      Pain Edu? --      Excl. in [place]? --     Constitutional: Alert and oriented. Well appearing and in no apparent distress. HEENT:      Head: Normocephalic and atraumatic.         Eyes: Conjunctivae are normal. Sclera is non-icteric. EOMI. PERRL      Mouth/Throat: Mucous membranes are moist.       Neck: Supple with no signs of meningismus.  Cardiovascular: Regular rate and rhythm. No murmurs, gallops, or rubs. 2+ symmetrical distal pulses are present in all extremities. No JVD. Respiratory: Tachypneic to the mid 20s, diffuse crackles bilaterally, no wheezing, sating low 90s on RA Gastrointestinal: Soft, non tender, and non distended with positive bowel sounds. No rebound or guarding. Genitourinary: No CVA tenderness. Musculoskeletal: Nontender with normal range of motion in all extremities. No edema, cyanosis, or erythema of extremities. Neurologic: Normal speech and language. Face is symmetric. Moving all extremities. No gross focal neurologic deficits are appreciated. Skin: Skin is  warm, dry and intact. No rash noted. Psychiatric: Mood and affect are normal. Speech and behavior are normal.  ____________________________________________   LABS (all labs ordered are listed, but only abnormal results are displayed)  Labs Reviewed  COMPREHENSIVE METABOLIC PANEL - Abnormal; Notable for the following:       Result Value   Sodium 134 (*)    CO2 21 (*)    Glucose, Bld 145 (*)    BUN 28 (*)    Creatinine, Ser 1.69 (*)    Albumin 3.2 (*)    ALT 11 (*)    GFR calc non Af Amer 26 (*)    GFR calc Af Amer 31 (*)    All other components within normal limits  CBC WITH DIFFERENTIAL/PLATELET - Abnormal; Notable for the following:    WBC 17.7 (*)    RDW 15.6 (*)    Platelets 122 (*)    Neutro Abs 14.0 (*)    Monocytes Absolute 1.6 (*)    All other components within normal limits  LACTIC ACID, PLASMA - Abnormal; Notable for the following:    Lactic Acid, Venous 2.0 (*)    All other components within normal limits  TROPONIN I - Abnormal; Notable for the following:    Troponin I 0.09 (*)    All other components within normal limits  URINALYSIS COMPLETEWITH MICROSCOPIC ([facility] ONLY) - Abnormal; Notable for the following:    Color, Urine YELLOW (*)    APPearance HAZY (*)    Hgb urine dipstick 1+ (*)    Protein, ur 100 (*)    Leukocytes, UA TRACE (*)    Squamous Epithelial / LPF 0-5 (*)    All other components within normal limits  CULTURE, BLOOD (ROUTINE X 2)  CULTURE, BLOOD (ROUTINE X 2)  URINE CULTURE  LACTIC ACID, PLASMA   ____________________________________________  EKG  ED ECG REPORT I, Rudene Re, the attending physician, personally viewed and interpreted this ECG. Sinus rhythm, rate of 88, normal intervals, left axis deviation, no ST elevations or depressions. ____________________________________________  RADIOLOGY  CXR:  Diffuse interstitial coarsening, chronic and likely related to underlying interstitial lung disease. No focal  consolidation.  Mild cardiomegaly with possible mild congestive changes. ____________________________________________   PROCEDURES  Procedure(s) performed: None Procedures Critical Care performed: yes  CRITICAL CARE Performed by: Rudene Re  ?  Total critical care time: 35 min  Critical care time was exclusive of separately billable procedures and treating other patients.  Critical care was necessary to treat or prevent imminent or life-threatening deterioration.  Critical care was time spent personally by me on the following activities: development of treatment plan with patient and/or surrogate as well as nursing, discussions with consultants, evaluation of patient's response to treatment, examination of patient, obtaining history from patient or surrogate, ordering and performing treatments and interventions, ordering and review of laboratory studies, ordering and review of radiographic studies, pulse oximetry and re-evaluation of patient's condition.  ____________________________________________  INITIAL IMPRESSION / ASSESSMENT AND PLAN / ED COURSE   82 y.o. female h/o CKD, DM, HTN, ILD who presents for generalized weakness, cough, and fever x3 days. Patient with tachypnea, sating in the low 90s on RA, diffuse bilateral crackles, and productive cough. Also with urinary frequency. Patient currently meets SIRS criteria with potential sources being PNA vs UTI. Sepsis   ProtocolInitiated. We'll hold off on antibiotics at this time and 2 sources identified. We'll give IV fluids, Tylenol for fever, check blood work, blood cultures, lactate, chest x-ray, UA.   Clinical Course  Comment By Time  Lactic acid of 2.0, troponin 0.09 with no evidence of ischemic changes on the EKG. UA with no evidence of infection, white count of 17.7. I do believe patient's source of infection is pneumonia because those are the symptoms that she's been having. She hasn't been hospitalized recently  and does not live in a nursing home. We'll initiate treatment with IV ceftriaxone and by mouth azithromycin. Patient is given IV fluids per sepsis protocol. We'll admit to the hospitalist service. Rudene Re, MD 09/24 2132    Pertinent labs & imaging results that were available during my care of the patient were reviewed by me and considered in my medical decision making (see chart for details).    ____________________________________________   FINAL CLINICAL IMPRESSION(S) / ED DIAGNOSES  Final diagnoses:  Sepsis, due to unspecified organism ([place])  Elevated troponin      NEW MEDICATIONS STARTED DURING THIS VISIT:  New Prescriptions   No medications on file     Note:  This document was prepared using Dragon voice recognition software and may include unintentional dictation errors.    Rudene Re, MD 07/19/16 2135    Rudene Re, MD 07/19/16 2137

## 2016-07-19 NOTE — ED Notes (Signed)
Fall bracelet placed on patient. Family at bedside. All deny need for anything at this time.

## 2016-07-20 ENCOUNTER — Inpatient Hospital Stay (HOSPITAL_COMMUNITY)
Admit: 2016-07-20 | Discharge: 2016-07-20 | Disposition: A | Discharge status: Home / Self Care | Payer: Medicare HMO | Attending: Internal Medicine | Admitting: Internal Medicine

## 2016-07-20 DIAGNOSIS — Z7982 Long term (current) use of aspirin: Secondary | ICD-10-CM | POA: Diagnosis not present

## 2016-07-20 DIAGNOSIS — J8489 Other specified interstitial pulmonary diseases: Secondary | ICD-10-CM | POA: Diagnosis present

## 2016-07-20 DIAGNOSIS — A419 Sepsis, unspecified organism: Secondary | ICD-10-CM | POA: Diagnosis present

## 2016-07-20 DIAGNOSIS — M109 Gout, unspecified: Secondary | ICD-10-CM | POA: Diagnosis present

## 2016-07-20 DIAGNOSIS — T380X5A Adverse effect of glucocorticoids and synthetic analogues, initial encounter: Secondary | ICD-10-CM | POA: Diagnosis present

## 2016-07-20 DIAGNOSIS — Z794 Long term (current) use of insulin: Secondary | ICD-10-CM | POA: Diagnosis not present

## 2016-07-20 DIAGNOSIS — J189 Pneumonia, unspecified organism: Secondary | ICD-10-CM | POA: Diagnosis present

## 2016-07-20 DIAGNOSIS — E1122 Type 2 diabetes mellitus with diabetic chronic kidney disease: Secondary | ICD-10-CM | POA: Diagnosis present

## 2016-07-20 DIAGNOSIS — Z885 Allergy status to narcotic agent status: Secondary | ICD-10-CM | POA: Diagnosis not present

## 2016-07-20 DIAGNOSIS — I509 Heart failure, unspecified: Secondary | ICD-10-CM | POA: Diagnosis not present

## 2016-07-20 DIAGNOSIS — J45909 Unspecified asthma, uncomplicated: Secondary | ICD-10-CM | POA: Diagnosis present

## 2016-07-20 DIAGNOSIS — E1165 Type 2 diabetes mellitus with hyperglycemia: Secondary | ICD-10-CM | POA: Diagnosis present

## 2016-07-20 DIAGNOSIS — I5033 Acute on chronic diastolic (congestive) heart failure: Secondary | ICD-10-CM | POA: Diagnosis present

## 2016-07-20 DIAGNOSIS — J9601 Acute respiratory failure with hypoxia: Secondary | ICD-10-CM | POA: Diagnosis present

## 2016-07-20 DIAGNOSIS — N183 Chronic kidney disease, stage 3 (moderate): Secondary | ICD-10-CM | POA: Diagnosis present

## 2016-07-20 DIAGNOSIS — R531 Weakness: Secondary | ICD-10-CM | POA: Diagnosis present

## 2016-07-20 DIAGNOSIS — K219 Gastro-esophageal reflux disease without esophagitis: Secondary | ICD-10-CM | POA: Diagnosis present

## 2016-07-20 DIAGNOSIS — R4182 Altered mental status, unspecified: Secondary | ICD-10-CM | POA: Diagnosis present

## 2016-07-20 DIAGNOSIS — Z953 Presence of xenogenic heart valve: Secondary | ICD-10-CM | POA: Diagnosis not present

## 2016-07-20 DIAGNOSIS — R7989 Other specified abnormal findings of blood chemistry: Secondary | ICD-10-CM

## 2016-07-20 DIAGNOSIS — R778 Other specified abnormalities of plasma proteins: Secondary | ICD-10-CM | POA: Diagnosis present

## 2016-07-20 DIAGNOSIS — Z882 Allergy status to sulfonamides status: Secondary | ICD-10-CM | POA: Diagnosis not present

## 2016-07-20 DIAGNOSIS — N170 Acute kidney failure with tubular necrosis: Secondary | ICD-10-CM | POA: Diagnosis present

## 2016-07-20 DIAGNOSIS — I130 Hypertensive heart and chronic kidney disease with heart failure and stage 1 through stage 4 chronic kidney disease, or unspecified chronic kidney disease: Secondary | ICD-10-CM | POA: Diagnosis present

## 2016-07-20 DIAGNOSIS — I248 Other forms of acute ischemic heart disease: Secondary | ICD-10-CM | POA: Diagnosis present

## 2016-07-20 LAB — BASIC METABOLIC PANEL
ANION GAP: 6 (ref 5–15)
BUN: 25 mg/dL — ABNORMAL HIGH (ref 6–20)
CO2: 24 mmol/L (ref 22–32)
Calcium: 8.2 mg/dL — ABNORMAL LOW (ref 8.9–10.3)
Chloride: 108 mmol/L (ref 101–111)
Creatinine, Ser: 1.4 mg/dL — ABNORMAL HIGH (ref 0.44–1.00)
GFR, EST AFRICAN AMERICAN: 39 mL/min — AB (ref >60)
GFR, EST NON AFRICAN AMERICAN: 33 mL/min — AB (ref >60)
Glucose, Bld: 126 mg/dL — ABNORMAL HIGH (ref 65–99)
POTASSIUM: 4.2 mmol/L (ref 3.5–5.1)
SODIUM: 138 mmol/L (ref 135–145)

## 2016-07-20 LAB — ECHOCARDIOGRAM COMPLETE
Height: 64 in
WEIGHTICAEL: 2992 [oz_av]

## 2016-07-20 LAB — CBC
HEMATOCRIT: 35.6 % (ref 35.0–47.0)
HEMOGLOBIN: 11.9 g/dL — AB (ref 12.0–16.0)
MCH: 33.4 pg (ref 26.0–34.0)
MCHC: 33.5 g/dL (ref 32.0–36.0)
MCV: 99.6 fL (ref 80.0–100.0)
Platelets: 100 10*3/uL — ABNORMAL LOW (ref 150–440)
RBC: 3.57 MIL/uL — AB (ref 3.80–5.20)
RDW: 15.8 % — ABNORMAL HIGH (ref 11.5–14.5)
WBC: 14.2 10*3/uL — AB (ref 3.6–11.0)

## 2016-07-20 LAB — TROPONIN I
TROPONIN I: 0.11 ng/mL — AB (ref <0.03)
Troponin I: 0.08 ng/mL (ref <0.03)
Troponin I: 0.08 ng/mL (ref <0.03)

## 2016-07-20 LAB — LACTIC ACID, PLASMA: Lactic Acid, Venous: 1 mmol/L (ref 0.5–1.9)

## 2016-07-20 LAB — EXPECTORATED SPUTUM ASSESSMENT W REFEX TO RESP CULTURE

## 2016-07-20 LAB — EXPECTORATED SPUTUM ASSESSMENT W GRAM STAIN, RFLX TO RESP C

## 2016-07-20 LAB — GLUCOSE, CAPILLARY
GLUCOSE-CAPILLARY: 102 mg/dL — AB (ref 65–99)
GLUCOSE-CAPILLARY: 128 mg/dL — AB (ref 65–99)
GLUCOSE-CAPILLARY: 327 mg/dL — AB (ref 65–99)
Glucose-Capillary: 201 mg/dL — ABNORMAL HIGH (ref 65–99)

## 2016-07-20 MED ORDER — LEVOTHYROXINE SODIUM 100 MCG PO TABS
100.0000 ug | ORAL_TABLET | Freq: Every day | ORAL | Status: DC
  Administered 2016-07-20 – 2016-07-22 (×3): 100 ug via ORAL
  Filled 2016-07-20 (×3): qty 1

## 2016-07-20 MED ORDER — ROPINIROLE HCL 0.25 MG PO TABS
1.0000 mg | ORAL_TABLET | Freq: Every day | ORAL | Status: DC
  Administered 2016-07-20 – 2016-07-21 (×2): 1 mg via ORAL
  Filled 2016-07-20 (×2): qty 4

## 2016-07-20 MED ORDER — DEXTROSE 5 % IV SOLN
1.0000 g | INTRAVENOUS | Status: DC
  Administered 2016-07-20 – 2016-07-21 (×2): 1 g via INTRAVENOUS
  Filled 2016-07-20 (×3): qty 10

## 2016-07-20 MED ORDER — INSULIN ASPART 100 UNIT/ML ~~LOC~~ SOLN
0.0000 [IU] | Freq: Three times a day (TID) | SUBCUTANEOUS | Status: DC
  Administered 2016-07-20: 1 [IU] via SUBCUTANEOUS
  Administered 2016-07-20: 3 [IU] via SUBCUTANEOUS
  Administered 2016-07-21: 7 [IU] via SUBCUTANEOUS
  Administered 2016-07-21 – 2016-07-22 (×3): 9 [IU] via SUBCUTANEOUS
  Filled 2016-07-20: qty 9
  Filled 2016-07-20: qty 7
  Filled 2016-07-20 (×2): qty 9
  Filled 2016-07-20: qty 1
  Filled 2016-07-20: qty 3

## 2016-07-20 MED ORDER — IPRATROPIUM-ALBUTEROL 0.5-2.5 (3) MG/3ML IN SOLN
3.0000 mL | Freq: Four times a day (QID) | RESPIRATORY_TRACT | Status: DC
  Administered 2016-07-20 – 2016-07-21 (×5): 3 mL via RESPIRATORY_TRACT
  Filled 2016-07-20 (×7): qty 3

## 2016-07-20 MED ORDER — HEPARIN SODIUM (PORCINE) 5000 UNIT/ML IJ SOLN
5000.0000 [IU] | Freq: Three times a day (TID) | INTRAMUSCULAR | Status: DC
  Administered 2016-07-20 (×2): 5000 [IU] via SUBCUTANEOUS
  Filled 2016-07-20 (×2): qty 1

## 2016-07-20 MED ORDER — SODIUM CHLORIDE 0.9 % IV SOLN
INTRAVENOUS | Status: DC
  Administered 2016-07-20: 03:00:00 via INTRAVENOUS

## 2016-07-20 MED ORDER — FUROSEMIDE 10 MG/ML IJ SOLN
40.0000 mg | Freq: Once | INTRAMUSCULAR | Status: AC
  Administered 2016-07-20: 40 mg via INTRAVENOUS
  Filled 2016-07-20: qty 4

## 2016-07-20 MED ORDER — ONDANSETRON HCL 4 MG PO TABS: 4.0000 mg | ORAL_TABLET | Freq: Four times a day (QID) | ORAL | Status: DC | PRN

## 2016-07-20 MED ORDER — ACETAMINOPHEN 650 MG RE SUPP: 650.0000 mg | Freq: Four times a day (QID) | RECTAL | Status: DC | PRN

## 2016-07-20 MED ORDER — AZITHROMYCIN 250 MG PO TABS
500.0000 mg | ORAL_TABLET | Freq: Every day | ORAL | Status: DC
  Administered 2016-07-20 – 2016-07-22 (×3): 500 mg via ORAL
  Filled 2016-07-20 (×3): qty 2

## 2016-07-20 MED ORDER — FUROSEMIDE 20 MG PO TABS
20.0000 mg | ORAL_TABLET | Freq: Every day | ORAL | Status: DC
  Administered 2016-07-20: 20 mg via ORAL
  Filled 2016-07-20: qty 1

## 2016-07-20 MED ORDER — HYDRALAZINE HCL 20 MG/ML IJ SOLN
10.0000 mg | Freq: Four times a day (QID) | INTRAMUSCULAR | Status: DC | PRN
  Administered 2016-07-20: 10 mg via INTRAVENOUS
  Filled 2016-07-20: qty 1

## 2016-07-20 MED ORDER — FUROSEMIDE 40 MG PO TABS
40.0000 mg | ORAL_TABLET | Freq: Every day | ORAL | Status: DC
  Administered 2016-07-21 – 2016-07-22 (×2): 40 mg via ORAL
  Filled 2016-07-20 (×2): qty 1

## 2016-07-20 MED ORDER — DOCUSATE SODIUM 100 MG PO CAPS
100.0000 mg | ORAL_CAPSULE | Freq: Two times a day (BID) | ORAL | Status: DC
  Administered 2016-07-20 – 2016-07-22 (×5): 100 mg via ORAL
  Filled 2016-07-20 (×5): qty 1

## 2016-07-20 MED ORDER — METOPROLOL SUCCINATE ER 50 MG PO TB24
50.0000 mg | ORAL_TABLET | Freq: Two times a day (BID) | ORAL | Status: DC
  Administered 2016-07-20 (×2): 50 mg via ORAL
  Filled 2016-07-20 (×2): qty 1

## 2016-07-20 MED ORDER — ENOXAPARIN SODIUM 40 MG/0.4ML ~~LOC~~ SOLN
40.0000 mg | SUBCUTANEOUS | Status: DC
  Administered 2016-07-20 – 2016-07-21 (×2): 40 mg via SUBCUTANEOUS
  Filled 2016-07-20 (×2): qty 0.4

## 2016-07-20 MED ORDER — GABAPENTIN 100 MG PO CAPS
100.0000 mg | ORAL_CAPSULE | Freq: Three times a day (TID) | ORAL | Status: DC
Start: 2016-07-20 — End: 2016-07-22
  Administered 2016-07-20 – 2016-07-22 (×6): 100 mg via ORAL
  Filled 2016-07-20 (×6): qty 1

## 2016-07-20 MED ORDER — PRAVASTATIN SODIUM 40 MG PO TABS
80.0000 mg | ORAL_TABLET | Freq: Every day | ORAL | Status: DC
  Administered 2016-07-20 – 2016-07-21 (×2): 80 mg via ORAL
  Filled 2016-07-20 (×2): qty 2

## 2016-07-20 MED ORDER — GLUCERNA SHAKE PO LIQD
237.0000 mL | Freq: Three times a day (TID) | ORAL | Status: DC
  Administered 2016-07-20 – 2016-07-21 (×2): 237 mL via ORAL

## 2016-07-20 MED ORDER — METHYLPREDNISOLONE SODIUM SUCC 40 MG IJ SOLR
40.0000 mg | Freq: Two times a day (BID) | INTRAMUSCULAR | Status: DC
  Administered 2016-07-20 – 2016-07-22 (×5): 40 mg via INTRAVENOUS
  Filled 2016-07-20 (×5): qty 1

## 2016-07-20 MED ORDER — ASPIRIN EC 81 MG PO TBEC
81.0000 mg | DELAYED_RELEASE_TABLET | Freq: Every day | ORAL | Status: DC
  Administered 2016-07-20 – 2016-07-22 (×3): 81 mg via ORAL
  Filled 2016-07-20 (×3): qty 1

## 2016-07-20 MED ORDER — CLONIDINE HCL 0.1 MG PO TABS
0.2000 mg | ORAL_TABLET | Freq: Every day | ORAL | Status: DC
  Administered 2016-07-20 – 2016-07-21 (×2): 0.2 mg via ORAL
  Filled 2016-07-20 (×2): qty 2

## 2016-07-20 MED ORDER — ALLOPURINOL 100 MG PO TABS
300.0000 mg | ORAL_TABLET | Freq: Every day | ORAL | Status: DC
  Administered 2016-07-20 – 2016-07-22 (×3): 300 mg via ORAL
  Filled 2016-07-20 (×3): qty 3

## 2016-07-20 MED ORDER — CLONIDINE HCL 0.1 MG PO TABS: 0.1000 mg | ORAL_TABLET | Freq: Two times a day (BID) | ORAL | Status: DC

## 2016-07-20 MED ORDER — ACETAMINOPHEN 325 MG PO TABS
650.0000 mg | ORAL_TABLET | Freq: Four times a day (QID) | ORAL | Status: DC | PRN
  Administered 2016-07-20 – 2016-07-21 (×4): 650 mg via ORAL
  Filled 2016-07-20 (×4): qty 2

## 2016-07-20 MED ORDER — PANTOPRAZOLE SODIUM 40 MG PO TBEC
40.0000 mg | DELAYED_RELEASE_TABLET | Freq: Every day | ORAL | Status: DC
  Administered 2016-07-20 – 2016-07-21 (×2): 40 mg via ORAL
  Filled 2016-07-20 (×2): qty 1

## 2016-07-20 MED ORDER — ONDANSETRON HCL 4 MG/2ML IJ SOLN: 4.0000 mg | Freq: Four times a day (QID) | INTRAMUSCULAR | Status: DC | PRN

## 2016-07-20 MED ORDER — BENZONATATE 100 MG PO CAPS
200.0000 mg | ORAL_CAPSULE | Freq: Three times a day (TID) | ORAL | Status: DC | PRN
  Administered 2016-07-20 (×3): 200 mg via ORAL
  Filled 2016-07-20 (×3): qty 2

## 2016-07-20 MED ORDER — SODIUM CHLORIDE 0.9% FLUSH
3.0000 mL | Freq: Two times a day (BID) | INTRAVENOUS | Status: DC
  Administered 2016-07-20 – 2016-07-22 (×6): 3 mL via INTRAVENOUS

## 2016-07-20 MED ORDER — INSULIN ASPART 100 UNIT/ML ~~LOC~~ SOLN
0.0000 [IU] | Freq: Every day | SUBCUTANEOUS | Status: DC
  Administered 2016-07-20: 4 [IU] via SUBCUTANEOUS
  Administered 2016-07-21: 3 [IU] via SUBCUTANEOUS
  Filled 2016-07-20: qty 4
  Filled 2016-07-20: qty 5
  Filled 2016-07-20: qty 4

## 2016-07-20 MED ORDER — CLONIDINE HCL 0.1 MG PO TABS
0.1000 mg | ORAL_TABLET | Freq: Every morning | ORAL | Status: DC
  Administered 2016-07-20 – 2016-07-22 (×3): 0.1 mg via ORAL
  Filled 2016-07-20 (×3): qty 1

## 2016-07-20 NOTE — ED Notes (Signed)
Dr willis at bedside

## 2016-07-20 NOTE — Progress Notes (Signed)
[facility] NAME: [name] [name]    MR#:  [id number]  DATE OF BIRTH:  Jul 05, 1929  SUBJECTIVE:  CHIEF COMPLAINT:   Chief Complaint  Patient presents with  . Altered Mental Status  . Nausea  . Weakness  . Code Sepsis    -Admitted with cough and weakness. Noted to have pneumonia. Also chest x-ray with some congestion. -Starting on Lasix today. Still feels very worn out  REVIEW OF SYSTEMS:  Review of Systems  Constitutional: Positive for malaise/fatigue. Negative for chills and fever.  HENT: Negative for ear discharge, ear pain and nosebleeds.   Eyes: Negative for blurred vision and double vision.  Respiratory: Positive for cough, shortness of breath and wheezing.   Cardiovascular: Positive for leg swelling. Negative for chest pain and palpitations.  Gastrointestinal: Negative for abdominal pain, constipation, diarrhea, nausea and vomiting.  Genitourinary: Negative for dysuria and urgency.  Musculoskeletal: Negative for myalgias.  Neurological: Negative for dizziness, speech change, focal weakness, seizures and headaches.  Psychiatric/Behavioral: Negative for depression.    DRUG ALLERGIES:   Allergies  Allergen Reactions  . Codeine Nausea Only  . Sulfonamide Derivatives Other (See Comments)    Reaction: unknown    VITALS:  Blood pressure (!) 155/61, pulse 88, temperature 98.7 F (37.1 C), temperature source Oral, resp. rate 19, height 5\' 4"  (1.626 m), weight 84.8 kg (187 lb), SpO2 94 %.  PHYSICAL EXAMINATION:  Physical Exam  GENERAL:  82-year-old patient lying in the bed with no acute distress.  EYES: Pupils equal, round, reactive to light and accommodation. No scleral icterus. Extraocular muscles intact.  HEENT: Head atraumatic, normocephalic. Oropharynx and nasopharynx clear.  NECK:  Supple, no jugular venous distention. No thyroid enlargement, no tenderness.  LUNGS: Moving air bilaterally, has coarse rhonchi  and wheezing, no rales, crepitation. No use of accessory muscles of respiration.  CARDIOVASCULAR: S1, S2 normal. No  rubs, or gallops. 3/6 systolic murmur present ABDOMEN: Soft, nontender, nondistended. Bowel sounds present. No organomegaly or mass.  EXTREMITIES: No pedal edema, cyanosis, or clubbing.  NEUROLOGIC: Cranial nerves II through XII are intact. Muscle strength 5/5 in all extremities. Sensation intact. Gait not checked. Global weakness noted PSYCHIATRIC: The patient is alert and oriented x 3.  SKIN: No obvious rash, lesion, or ulcer.    LABORATORY PANEL:   CBC  Recent Labs Lab 07/20/16 0326  WBC 14.2*  HGB 11.9*  HCT 35.6  PLT 100*   ------------------------------------------------------------------------------------------------------------------  Chemistries   Recent Labs Lab 07/19/16 2016 07/20/16 0326  NA 134* 138  K 4.3 4.2  CL 102 108  CO2 21* 24  GLUCOSE 145* 126*  BUN 28* 25*  CREATININE 1.69* 1.40*  CALCIUM 8.9 8.2*  AST 27  --   ALT 11*  --   ALKPHOS 67  --   BILITOT 1.0  --    ------------------------------------------------------------------------------------------------------------------  Cardiac Enzymes  Recent Labs Lab 07/20/16 0853  TROPONINI 0.08*   ------------------------------------------------------------------------------------------------------------------  RADIOLOGY:  Dg Chest Port 1 View  Result Date: 07/19/2016 CLINICAL DATA:  82 year old female with sepsis EXAM: PORTABLE CHEST 1 VIEW COMPARISON:  Chest radiograph dated 06/02/2012 FINDINGS: Single portable view of the chest demonstrate diffuse interstitial coarsening similar to prior study, likely chronic and related to underlying interstitial lung disease. Mild superimposed congestive changes is less likely but not excluded. There is a stable 5 mm nodule in the left lower lung field corresponding to the calcified granuloma seen on CT dated 01/29/2007. Or  there is no focal  consolidation, pleural effusion, or pneumothorax. There is mild cardiomegaly, increased from prior study. Median sternotomy wires the mechanical cardiac bowel, and CABG clips noted. There is atherosclerotic calcification of the aortic arch. Left shoulder arthroplasty. There is degenerative changes of the spine. No acute fracture IMPRESSION: Diffuse interstitial coarsening, chronic and likely related to underlying interstitial lung disease. No focal consolidation. Mild cardiomegaly with possible mild congestive changes. Electronically Signed   By: Anner Crete M.D.   On: 07/19/2016 21:17    EKG:   Orders placed or performed in visit on 05/31/12  . EKG 12-Lead    ASSESSMENT AND PLAN:   82 year old female with past medical history significant for BOOP, diabetes, hypertension, history of aortic valve replacement with bovine valve, gout presents to the hospital secondary to sepsis from pneumonia  #1 sepsis-secondary to community-acquired pneumonia. Blood cultures are pending. -Continue Rocephin and azithromycin. -Blood pressure is elevated. Continue oxygen support.  #2 acute hypoxic respiratory failure-not on home oxygen. Secondary to pneumonia and possible underlying CHF. -Lasix added. Check echocardiogram. History of prior T12 replacement. -Incentive spirometry and wean oxygen as tolerated. -Cough medications added as well -Steroids and nebs for reactive airway disease and wheezing  #3 hypertension-continue home medications. On clonidine, Lasix, Toprol.  #4 diabetes mellitus-poor by mouth intake. Started on sliding scale. -Hold low-dose Lantus and Humalog and glipizide  #5 GERD-on Protonix  #6 DVT prophylaxis-on Lovenox  Physical therapy consult tomorrow   All the records are reviewed and case discussed with Care Management/Social Workerr. Management plans discussed with the patient, family and they are in agreement.  CODE STATUS: Full Code  TOTAL TIME TAKING CARE OF THIS  PATIENT: 38 minutes.   POSSIBLE D/C IN 1-2 DAYS, DEPENDING ON CLINICAL CONDITION.   Gladstone Lighter M.D on 07/20/2016 at 3:20 PM  Between 7am to 6pm - Pager - [phone]  After 6pm go to [email or web address] - password [email or web address] [facility]  Office  [phone]  CC: Primary care physician; Gayland Curry, MD

## 2016-07-20 NOTE — H&P (Signed)
[facility] [place] NAME: [name] [name]    MR#:  [id number]  DATE OF BIRTH:  02/05/1929  DATE OF ADMISSION:  07/19/2016  PRIMARY CARE PHYSICIAN: Gayland Curry, MD   REQUESTING/REFERRING PHYSICIAN: Alfred Levins, MD  CHIEF COMPLAINT:   Chief Complaint  Patient presents with  . Altered Mental Status  . Nausea  . Weakness  . Code Sepsis    HISTORY OF PRESENT ILLNESS:  [name] [name]  is a 82 y.o. female who presents with Weakness and cough for the past 2 weeks. Is been getting progressively worse. Patient has significant sputum production. Here in the ED she was found to meet sepsis criteria. Chest imaging did not show focal pneumonia, but did show interstitial disease. This was read as similar to prior studies, but given her clinical symptoms was felt that pneumonia as a source of her sepsis and hospitalists were called for admission.  PAST MEDICAL HISTORY:  No past medical history on file.  PAST SURGICAL HISTORY:   Past Surgical History:  Procedure Laterality Date  . ABDOMINAL HYSTERECTOMY    . BREAST BIOPSY Right 20+yrs ago   negative    SOCIAL HISTORY:   Social History  Substance Use Topics  . Smoking status: Not on file  . Smokeless tobacco: Not on file  . Alcohol use Not on file    FAMILY HISTORY:   Family History  Problem Relation Age of Onset  . Breast cancer Mother 77    DRUG ALLERGIES:   Allergies  Allergen Reactions  . Codeine Nausea Only  . Sulfonamide Derivatives Other (See Comments)    Reaction: unknown    MEDICATIONS AT HOME:   Prior to Admission medications   Medication Sig Start Date End Date Taking? Authorizing Provider  allopurinol (ZYLOPRIM) 300 MG tablet Take 300 mg by mouth daily. 06/14/16  Yes Historical Provider, MD  aspirin EC 81 MG tablet Take 81 mg by mouth daily.   Yes Historical Provider, MD  cholecalciferol (VITAMIN D) 1000 units tablet Take 1,000 Units by mouth daily at 12 noon.    Yes Historical Provider, MD  cloNIDine (CATAPRES) 0.1 MG tablet Take 0.1-0.2 mg by mouth 2 (two) times daily. 0.1mg  in the morning and 0.2mg  at bedtime 06/16/16  Yes Historical Provider, MD  docusate sodium (COLACE) 100 MG capsule Take 100 mg by mouth at bedtime.   Yes Historical Provider, MD  furosemide (LASIX) 20 MG tablet Take 20 mg by mouth daily. 06/14/16  Yes Historical Provider, MD  gabapentin (NEURONTIN) 100 MG capsule Take 100 mg by mouth 3 (three) times daily. 06/14/16  Yes Historical Provider, MD  glipiZIDE (GLUCOTROL) 10 MG tablet Take 10 mg by mouth 3 (three) times daily. 06/14/16  Yes Historical Provider, MD  HUMALOG 100 UNIT/ML injection Inject 1 Dose into the skin 3 (three) times daily as needed for high blood sugar. 06/22/16  Yes Historical Provider, MD  insulin glargine (LANTUS) 100 UNIT/ML injection Inject 30 Units into the skin at bedtime.   Yes Historical Provider, MD  levothyroxine (SYNTHROID, LEVOTHROID) 100 MCG tablet Take 100 mcg by mouth daily. 06/14/16  Yes Historical Provider, MD  metoprolol succinate (TOPROL-XL) 50 MG 24 hr tablet Take 50 mg by mouth 2 (two) times daily. 06/16/16  Yes Historical Provider, MD  Multiple Vitamin (MULTIVITAMIN WITH MINERALS) TABS tablet Take 1 tablet by mouth daily at 12 noon.   Yes Historical Provider, MD  omega-3 acid [name] esters (LOVAZA) 1 g capsule Take 1 g  by mouth daily at 12 noon.   Yes Historical Provider, MD  omeprazole (PRILOSEC) 20 MG capsule Take 20 mg by mouth at bedtime. 07/07/16  Yes Historical Provider, MD  pravastatin (PRAVACHOL) 80 MG tablet Take 80 mg by mouth at bedtime. 11/08/15  Yes Historical Provider, MD  rOPINIRole (REQUIP) 0.5 MG tablet Take 1 mg by mouth at bedtime. 05/21/16  Yes Historical Provider, MD  triamcinolone cream (KENALOG) 0.1 % Apply 1 application topically 2 (two) times daily. 07/07/16  Yes Historical Provider, MD  vitamin B-12 (CYANOCOBALAMIN) 1000 MCG tablet Take 1,000 mcg by mouth daily at 12 noon.   Yes  Historical Provider, MD    REVIEW OF SYSTEMS:  Review of Systems  Constitutional: Positive for malaise/fatigue. Negative for chills, fever and weight loss.  HENT: Negative for ear pain, hearing loss and tinnitus.   Eyes: Negative for blurred vision, double vision, pain and redness.  Respiratory: Positive for cough, sputum production and shortness of breath. Negative for hemoptysis.   Cardiovascular: Negative for chest pain, palpitations, orthopnea and leg swelling.  Gastrointestinal: Positive for nausea. Negative for abdominal pain, constipation, diarrhea and vomiting.  Genitourinary: Negative for dysuria, frequency and hematuria.  Musculoskeletal: Negative for back pain, joint pain and neck pain.  Skin:       No acne, rash, or lesions  Neurological: Negative for dizziness, tremors, focal weakness and weakness.  Endo/Heme/Allergies: Negative for polydipsia. Does not bruise/bleed easily.  Psychiatric/Behavioral: Negative for depression. The patient is not nervous/anxious and does not have insomnia.      VITAL SIGNS:   Vitals:   07/19/16 2030 07/19/16 2100 07/19/16 2130 07/19/16 2200  BP: (!) 180/70 (!) 164/78 (!) 169/62 (!) 156/67  Pulse: 85 81 85 87  Resp: (!) 23 (!) 29 17 (!) 34  Temp:    98.7 F (37.1 C)  TempSrc:      SpO2: 91% 91% 91% 90%  Weight:      Height:       Wt Readings from Last 3 Encounters:  07/19/16 81.6 kg (180 lb)  01/11/09 95.3 kg (210 lb)  11/29/08 94.6 kg (208 lb 8 oz)    PHYSICAL EXAMINATION:  Physical Exam  Vitals reviewed. Constitutional: She is oriented to person, place, and time. She appears well-developed and well-nourished. No distress.  HENT:  Head: Normocephalic and atraumatic.  Mouth/Throat: Oropharynx is clear and moist.  Eyes: Conjunctivae and EOM are normal. Pupils are equal, round, and reactive to light. No scleral icterus.  Neck: Normal range of motion. Neck supple. No JVD present. No thyromegaly present.  Cardiovascular: Normal  rate, regular rhythm and intact distal pulses.  Exam reveals no gallop and no friction rub.   No murmur heard. Respiratory: Effort normal. No respiratory distress. She has no wheezes. She has rales.  GI: Soft. Bowel sounds are normal. She exhibits no distension. There is no tenderness.  Musculoskeletal: Normal range of motion. She exhibits no edema.  No arthritis, no gout  Lymphadenopathy:    She has no cervical adenopathy.  Neurological: She is alert and oriented to person, place, and time. No cranial nerve deficit.  No dysarthria, no aphasia  Skin: Skin is warm and dry. No rash noted. No erythema.  Psychiatric: She has a normal mood and affect. Her behavior is normal. Judgment and thought content normal.    LABORATORY PANEL:   CBC  Recent Labs Lab 07/19/16 2016  WBC 17.7*  HGB 13.1  HCT 39.5  PLT 122*   ------------------------------------------------------------------------------------------------------------------  Chemistries  Recent Labs Lab 07/19/16 2016  NA 134*  K 4.3  CL 102  CO2 21*  GLUCOSE 145*  BUN 28*  CREATININE 1.69*  CALCIUM 8.9  AST 27  ALT 11*  ALKPHOS 50  BILITOT 1.0   ------------------------------------------------------------------------------------------------------------------  Cardiac Enzymes  Recent Labs Lab 07/19/16 2016  TROPONINI 0.09*   ------------------------------------------------------------------------------------------------------------------  RADIOLOGY:  Dg Chest Port 1 View  Result Date: 07/19/2016 CLINICAL DATA:  82 year old female with sepsis EXAM: PORTABLE CHEST 1 VIEW COMPARISON:  Chest radiograph dated 06/02/2012 FINDINGS: Single portable view of the chest demonstrate diffuse interstitial coarsening similar to prior study, likely chronic and related to underlying interstitial lung disease. Mild superimposed congestive changes is less likely but not excluded. There is a stable 5 mm nodule in the left lower lung  field corresponding to the calcified granuloma seen on CT dated 01/29/2007. Or there is no focal consolidation, pleural effusion, or pneumothorax. There is mild cardiomegaly, increased from prior study. Median sternotomy wires the mechanical cardiac bowel, and CABG clips noted. There is atherosclerotic calcification of the aortic arch. Left shoulder arthroplasty. There is degenerative changes of the spine. No acute fracture IMPRESSION: Diffuse interstitial coarsening, chronic and likely related to underlying interstitial lung disease. No focal consolidation. Mild cardiomegaly with possible mild congestive changes. Electronically Signed   By: Anner Crete M.D.   On: 07/19/2016 21:17    EKG:   Orders placed or performed during the hospital encounter of 07/19/16  . ED EKG 12-Lead  . ED EKG 12-Lead    IMPRESSION AND PLAN:  Principal Problem:   Sepsis ([place]) - IV antibiotics given in the ED and continued on admission, patient is hemodynamically stable, pneumonia felt to be the source of her sepsis. Cultures sent from the ED Active Problems:   CAP (community acquired pneumonia) - antibiotics and cultures as above   Diabetes ([place]) - sliding scale insulin with corresponding glucose checks and carb modified diet   Essential hypertension - continue home meds   Elevated troponin - likely due to demand ischemia from her sepsis, we will trend her cardiac enzymes tonight   RENAL FAILURE, CHRONIC - at baseline, avoid nephrotoxins and monitor  All the records are reviewed and case discussed with ED provider. Management plans discussed with the patient and/or family.  DVT PROPHYLAXIS: SubQ heparin  GI PROPHYLAXIS: PPI  ADMISSION STATUS: Inpatient  CODE STATUS: Full Code Status History    This patient does not have a recorded code status. Please follow your organizational policy for patients in this situation.      TOTAL TIME TAKING CARE OF THIS PATIENT: 45 minutes.    Renley Banwart  [place] 07/20/2016, 1:11 AM  [facility]  Office  [phone]  CC: Primary care physician; Gayland Curry, MD

## 2016-07-20 NOTE — Progress Notes (Signed)
CRITICAL VALUE ALERT  Critical value received: Elevated troponin (.11)  Date of notification: 07/20/2016  Time of notification: 3:51 PM  Critical value read back:No.  Nurse who received alert: Daron Offer  MD notified (1st page): Dr. Tressia Miners  Time of first page: 3:52 PM  MD notified (2nd page):  Time of second page:  Responding MD:  Time MD responded:

## 2016-07-20 NOTE — Care Management (Signed)
patient presents from home with increasing weakness.  She is admitted with sepsis and felt that it is due to pneumonia.  Current 02 requirement is acute.   Patient is current with pcp, usually able to perform her adls and no issues accessing medical care or obtaining medications. Discussed during progression of need to perform and document home 02 assessment

## 2016-07-20 NOTE — Progress Notes (Signed)
Secretary paged attending physician at this time regarding elevated B.P. Awaiting response. Wenda Low [facility]

## 2016-07-20 NOTE — Progress Notes (Signed)
Pharmacy Antibiotic Note  [name] [name] is a 82 y.o. female admitted on 07/19/2016 with pneumonia.  Pharmacy has been consulted for ceftriaxone dosing.  Day 2 of abx  Plan: Continue ceftriaxone 1 gram IV q 24 hours ordered. Patient is also on azithromycin 500mg  PO daily.  Height: 5\' 4"  (162.6 cm) Weight: 187 lb (84.8 kg) IBW/kg (Calculated) : 54.7  Temp (24hrs), Avg:99.2 F (37.3 C), Min:98.3 F (36.8 C), Max:101.2 F (38.4 C)   Recent Labs Lab 07/19/16 2016 07/20/16 0206 07/20/16 0326  WBC 17.7*  --  14.2*  CREATININE 1.69*  --  1.40*  LATICACIDVEN 2.0* 1.0  --     Estimated Creatinine Clearance: 30.9 mL/min (by C-G formula based on SCr of 1.4 mg/dL (H)).    Allergies  Allergen Reactions  . Codeine Nausea Only  . Sulfonamide Derivatives Other (See Comments)    Reaction: unknown    Antimicrobials this admission: ceftriaxone  >>  azithromycin  >>   Dose adjustments this admission:   Microbiology results: 9/24 BCx: pending 9/24 UCx: pending   9/25 Sputum: needs collection  9/24 UA: LE (tr) NO2(-)  WBC TNTC  Thank you for allowing pharmacy to be a part of this patient's care.  Loree Fee, PharmD 07/20/2016 11:19 AM

## 2016-07-20 NOTE — Progress Notes (Signed)
A&O. Admitted from home, via the ED, for pneumonia. IV antibiotics given in ED. Patient incontinent. Son at side. Medicated for cough and headache. Call light in reach, bed in low position with side rails up.

## 2016-07-20 NOTE — Progress Notes (Signed)
Pharmacy Antibiotic Note  [name] [name] is a 82 y.o. female admitted on 07/19/2016 with pneumonia.  Pharmacy has been consulted for ceftriaxone dosing.  Plan: Ceftriaxone 1 gram q 24 hours ordered. Patient is also on azithromycin  Height: 5\' 4"  (162.6 cm) Weight: 180 lb (81.6 kg) IBW/kg (Calculated) : 54.7  Temp (24hrs), Avg:99.2 F (37.3 C), Min:98.3 F (36.8 C), Max:101.2 F (38.4 C)   Recent Labs Lab 07/19/16 2016 07/20/16 0206  WBC 17.7*  --   CREATININE 1.69*  --   LATICACIDVEN 2.0* 1.0    Estimated Creatinine Clearance: 25.2 mL/min (by C-G formula based on SCr of 1.69 mg/dL (H)).    Allergies  Allergen Reactions  . Codeine Nausea Only  . Sulfonamide Derivatives Other (See Comments)    Reaction: unknown    Antimicrobials this admission: ceftriaxone  >>  azithromycin  >>   Dose adjustments this admission:   Microbiology results: 9/24 BCx: pending 9/24 UCx: pending   9/25 Sputum: pending     9/24 UA: LE (tr) NO2(-)  WBC TNTC  Thank you for allowing pharmacy to be a part of this patient's care.  Jamy Whyte S 07/20/2016 3:18 AM

## 2016-07-20 NOTE — Progress Notes (Signed)
  Echocardiogram 2D Echocardiogram has been performed.  Jennette Dubin 07/20/2016, 3:22 PM

## 2016-07-21 ENCOUNTER — Inpatient Hospital Stay: Payer: Medicare HMO

## 2016-07-21 LAB — BASIC METABOLIC PANEL
ANION GAP: 11 (ref 5–15)
BUN: 29 mg/dL — ABNORMAL HIGH (ref 6–20)
CALCIUM: 8.7 mg/dL — AB (ref 8.9–10.3)
CO2: 23 mmol/L (ref 22–32)
CREATININE: 1.34 mg/dL — AB (ref 0.44–1.00)
Chloride: 104 mmol/L (ref 101–111)
GFR, EST AFRICAN AMERICAN: 41 mL/min — AB (ref >60)
GFR, EST NON AFRICAN AMERICAN: 35 mL/min — AB (ref >60)
Glucose, Bld: 347 mg/dL — ABNORMAL HIGH (ref 65–99)
Potassium: 3.7 mmol/L (ref 3.5–5.1)
SODIUM: 138 mmol/L (ref 135–145)

## 2016-07-21 LAB — HEMOGLOBIN A1C
HEMOGLOBIN A1C: 8.5 % — AB (ref 4.8–5.6)
MEAN PLASMA GLUCOSE: 197 mg/dL

## 2016-07-21 LAB — GLUCOSE, CAPILLARY
GLUCOSE-CAPILLARY: 360 mg/dL — AB (ref 65–99)
Glucose-Capillary: 323 mg/dL — ABNORMAL HIGH (ref 65–99)
Glucose-Capillary: 394 mg/dL — ABNORMAL HIGH (ref 65–99)
Glucose-Capillary: 256 mg/dL — ABNORMAL HIGH (ref 65–99)

## 2016-07-21 LAB — URINE CULTURE

## 2016-07-21 MED ORDER — INSULIN ASPART 100 UNIT/ML ~~LOC~~ SOLN
5.0000 [IU] | Freq: Three times a day (TID) | SUBCUTANEOUS | Status: DC
  Administered 2016-07-21 – 2016-07-22 (×3): 5 [IU] via SUBCUTANEOUS
  Filled 2016-07-21 (×3): qty 5

## 2016-07-21 MED ORDER — CEPASTAT 14.5 MG MT LOZG
1.0000 | LOZENGE | OROMUCOSAL | Status: DC | PRN
  Administered 2016-07-21 (×2): 1 via BUCCAL
  Filled 2016-07-21 (×2): qty 9

## 2016-07-21 MED ORDER — METOPROLOL SUCCINATE ER 100 MG PO TB24
100.0000 mg | ORAL_TABLET | Freq: Two times a day (BID) | ORAL | Status: DC
  Administered 2016-07-21 – 2016-07-22 (×3): 100 mg via ORAL
  Filled 2016-07-21 (×3): qty 1

## 2016-07-21 MED ORDER — INSULIN GLARGINE 100 UNIT/ML ~~LOC~~ SOLN
20.0000 [IU] | Freq: Every day | SUBCUTANEOUS | Status: DC
  Administered 2016-07-21: 20 [IU] via SUBCUTANEOUS
  Filled 2016-07-21 (×2): qty 0.2

## 2016-07-21 MED ORDER — INSULIN GLARGINE 100 UNIT/ML ~~LOC~~ SOLN
20.0000 [IU] | Freq: Every day | SUBCUTANEOUS | Status: DC
  Filled 2016-07-21: qty 0.2

## 2016-07-21 NOTE — Evaluation (Signed)
Physical Therapy Evaluation Patient Details Name: [name] [name] MRN: [id number] DOB: 12/01/1928 Today's Date: 07/21/2016   History of Present Illness  Pt is a 82 y.o. female who presents with Weakness and cough for the past 2 weeks. Has been getting progressively worse. Patient has significant sputum production. In the ED pt was found to meet sepsis criteria. Chest imaging did not show focal pneumonia, but did show interstitial disease. This was read as similar to prior studies, but given her clinical symptoms was felt that pneumonia as a source of her sepsis and hospitalists were called for admission.  Clinical Impression  Pt presents with deficits in strength, gait, mobility, balance, and activity tolerance.  Pt SBA with bed mobility with increased time needed to complete tasks and SBA with transfers.  Pt refused amb assessment with AD and was able to amb 50' without AD with slow cadence with min instability but no assistance needed to prevent LOB.  Pt limited by fatigue/poor activity tolerance.  SpO2 on room air at baseline was 93-94% and dropped to 88% after amb but improved to 93% after sitting for < 60 sec.  Pt will benefit from PT services to address above deficits for decreased caregiver assistance and decreased fall risk upon discharge.        Follow Up Recommendations Home health PT    Equipment Recommendations       Recommendations for Other Services       Precautions / Restrictions Precautions Precautions: Fall Restrictions Weight Bearing Restrictions: No      Mobility  Bed Mobility Overal bed mobility: Needs Assistance Bed Mobility: Supine to Sit;Sit to Supine     Supine to sit: Supervision Sit to supine: Supervision      Transfers Overall transfer level: Needs assistance Equipment used: None Transfers: Sit to/from Stand              Ambulation/Gait Ambulation/Gait assistance: Supervision Ambulation Distance (Feet): 50 Feet Assistive device: None      Gait velocity interpretation: Below normal speed for age/gender General Gait Details: Min instability with gait with mild drifting L/R and slow cadence  Stairs            Wheelchair Mobility    Modified Rankin (Stroke Patients Only)       Balance Overall balance assessment: Needs assistance   Sitting balance-Leahy Scale: Good       Standing balance-Leahy Scale: Fair                               Pertinent Vitals/Pain Pain Assessment: No/denies pain    Home Living Family/patient expects to be discharged to:: Private residence Living Arrangements: Children Available Help at Discharge: Family Type of Home: Mobile home Home Access: Ramped entrance     Home Layout: One level Home Equipment: Cane - single point      Prior Function Level of Independence: Independent         Comments: Ind amb without AD, used SPC only to go to the mailbox, no AD in the home or community, ind with ADLs, no fall history     Hand Dominance   Dominant Hand: Right    Extremity/Trunk Assessment   Upper Extremity Assessment: Overall WFL for tasks assessed           Lower Extremity Assessment: Generalized weakness         Communication   Communication: No difficulties  Cognition Arousal/Alertness: Awake/alert Behavior During  Therapy: WFL for tasks assessed/performed Overall Cognitive Status: Within Functional Limits for tasks assessed                      General Comments      Exercises Total Joint Exercises Ankle Circles/Pumps: AROM;Both;10 reps Gluteal Sets: AROM;Both;10 reps Long Arc Quad: AROM;Both;10 reps Marching in Standing: AROM;Both;10 reps Other Exercises Other Exercises: Static balance training with feet apart, together, and semi tandem with eyes open/closed, head still/head turns with mild instability   Assessment/Plan    PT Assessment Patient needs continued PT services  PT Problem List Decreased strength;Decreased  activity tolerance;Decreased balance;Decreased mobility          PT Treatment Interventions DME instruction;Gait training;Stair training;Functional mobility training;Therapeutic activities;Therapeutic exercise;Balance training;Neuromuscular re-education;Patient/family education    PT Goals (Current goals can be found in the Care Plan section)  Acute Rehab PT Goals Patient Stated Goal: To walk better PT Goal Formulation: With patient Time For Goal Achievement: 08/04/16 Potential to Achieve Goals: Good    Frequency Min 2X/week   Barriers to discharge        Co-evaluation               End of Session Equipment Utilized During Treatment: Gait belt Activity Tolerance: Patient limited by fatigue Patient left: in chair;with call bell/phone within reach;with chair alarm set;with family/visitor present           Time: 1010-1045 PT Time Calculation (min) (ACUTE ONLY): 35 min   Charges:   PT Evaluation $PT Eval Low Complexity: 1 Procedure PT Treatments $Therapeutic Exercise: 8-22 mins   PT G Codes:        DRoyetta Asal PT, DPT 07/21/16, 11:23 AM

## 2016-07-21 NOTE — Progress Notes (Signed)
To xray via bed 

## 2016-07-21 NOTE — Progress Notes (Signed)
Pt stated that she had been on Lantus  20 units daily prior to coming to the hospital and she was not getting it after admission. BS were 201  At 1655, 327 at 2114,  and 347 at 0429. MD made aware. New order placed for 20 units Lantus at bedtime.

## 2016-07-21 NOTE — Progress Notes (Signed)
Inpatient Diabetes Program Recommendations  AACE/ADA: New Consensus Statement on Inpatient Glycemic Control (2015)  Target Ranges:  Prepandial:   less than 140 mg/dL      Peak postprandial:   less than 180 mg/dL (1-2 hours)      Critically ill patients:  140 - 180 mg/dL   Results for BREIANNA, DELFINO (MRN [id number]) as of 07/21/2016 10:13  Ref. Range 07/20/2016 07:30 07/20/2016 12:09 07/20/2016 16:55 07/20/2016 21:14 07/21/2016 08:18  Glucose-Capillary Latest Ref Range: 65 - 99 mg/dL 102 (H) 128 (H) 201 (H) 327 (H) 323 (H)   Review of Glycemic Control  Diabetes history: DM2 Outpatient Diabetes medications: Glipizide 10 mg TID with meals, Lantus 30 units QHS, Humalog TID with meals (no dose noted on home medication list) Current orders for Inpatient glycemic control: Lantus 20 units QHS, Novolog 0-9 units TID with meals, Novolog 0-5 units QHS, Novolog 5 units TID with meals for meal coverage  Inpatient Diabetes Program Recommendations: Insulin - Basal: Note Lantus 20 units QHS was ordered this morning. Fasting glucose is 323 mg/dl. Please consider changing frequency of Lantus to daily to start now.  Thanks, Barnie Alderman, RN, MSN, CDE Diabetes Coordinator Inpatient Diabetes Program [phone] (Team Pager from [place] to [place]) [phone] ([facility] office) [phone] [facility] office) [phone] [facility] office)

## 2016-07-21 NOTE — Care Management Important Message (Signed)
Important Message  Patient Details  Name: [name] [name] MRN: [id number] Date of Birth: Aug 27, 1929   Medicare Important Message Given:  Yes    Katrina Stack, RN 07/21/2016, 10:17 AM

## 2016-07-21 NOTE — Progress Notes (Signed)
Back from xray

## 2016-07-21 NOTE — Progress Notes (Signed)
[facility] [place] NAME: [name] [name]    MR#:  [id number]  DATE OF BIRTH:  May 07, 1929  SUBJECTIVE:  CHIEF COMPLAINT:   Chief Complaint  Patient presents with  . Altered Mental Status  . Nausea  . Weakness  . Code Sepsis    -Feels better today, cough is improving as well - off o2, son at bedside  REVIEW OF SYSTEMS:  Review of Systems  Constitutional: Negative for chills, fever and malaise/fatigue.  HENT: Negative for ear discharge, ear pain and nosebleeds.   Eyes: Negative for blurred vision and double vision.  Respiratory: Positive for cough, shortness of breath and wheezing.   Cardiovascular: Positive for leg swelling. Negative for chest pain and palpitations.       Left ankle swelling noted.  Gastrointestinal: Negative for abdominal pain, constipation, diarrhea, nausea and vomiting.  Genitourinary: Negative for dysuria and urgency.  Musculoskeletal: Negative for myalgias.  Neurological: Negative for dizziness, speech change, focal weakness, seizures and headaches.  Psychiatric/Behavioral: Negative for depression.    DRUG ALLERGIES:   Allergies  Allergen Reactions  . Codeine Nausea Only  . Sulfonamide Derivatives Other (See Comments)    Reaction: unknown    VITALS:  Blood pressure 130/60, pulse 92, temperature 97.8 F (36.6 C), resp. rate 18, height 5\' 4"  (1.626 m), weight 80.3 kg (177 lb 1.6 oz), SpO2 91 %.  PHYSICAL EXAMINATION:  Physical Exam  GENERAL:  82 y.o.-year-old patient lying in the bed with no acute distress.  EYES: Pupils equal, round, reactive to light and accommodation. No scleral icterus. Extraocular muscles intact.  HEENT: Head atraumatic, normocephalic. Oropharynx and nasopharynx clear.  NECK:  Supple, no jugular venous distention. No thyroid enlargement, no tenderness.  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 No use of accessory muscles of respiration.  CARDIOVASCULAR: S1, S2 normal. No  rubs, or gallops. 3/6 systolic murmur present ABDOMEN: Soft, nontender, nondistended. Bowel sounds present. No organomegaly or mass.  EXTREMITIES: No pedal edema, cyanosis, or clubbing.  NEUROLOGIC: Cranial nerves II through XII are intact. Muscle strength 5/5 in all extremities. Sensation intact. Gait not checked. Global weakness noted PSYCHIATRIC: The patient is alert and oriented x 3.  SKIN: No obvious rash, lesion, or ulcer.    LABORATORY PANEL:   CBC  Recent Labs Lab 07/20/16 0326  WBC 14.2*  HGB 11.9*  HCT 35.6  PLT 100*   ------------------------------------------------------------------------------------------------------------------  Chemistries   Recent Labs Lab 07/19/16 2016  07/21/16 0429  NA 134*  < > 138  K 4.3  < > 3.7  CL 102  < > 104  CO2 21*  < > 23  GLUCOSE 145*  < > 347*  BUN 28*  < > 29*  CREATININE 1.69*  < > 1.34*  CALCIUM 8.9  < > 8.7*  AST 27  --   --   ALT 11*  --   --   ALKPHOS 67  --   --   BILITOT 1.0  --   --   < > = values in this interval not displayed. ------------------------------------------------------------------------------------------------------------------  Cardiac Enzymes  Recent Labs Lab 07/20/16 1451  TROPONINI 0.11*   ------------------------------------------------------------------------------------------------------------------  RADIOLOGY:  Dg Chest 2 View  Result Date: 07/21/2016 CLINICAL DATA:  Cough and weakness, pneumonia EXAM: CHEST  2 VIEW COMPARISON:  07/19/2016 FINDINGS: Cardiomegaly again noted. Left shoulder prosthesis. Status post  median sternotomy. There is chronic interstitial prominence again noted. Hazy left basilar atelectasis or infiltrate. IMPRESSION: Chronic interstitial prominence again noted. No pulmonary edema. Hazy left basilar atelectasis or infiltrate. Electronically Signed   By:  Lahoma Crocker M.D.   On: 07/21/2016 08:38   Dg Chest Port 1 View  Result Date: 07/19/2016 CLINICAL DATA:  82 year old female with sepsis EXAM: PORTABLE CHEST 1 VIEW COMPARISON:  Chest radiograph dated 06/02/2012 FINDINGS: Single portable view of the chest demonstrate diffuse interstitial coarsening similar to prior study, likely chronic and related to underlying interstitial lung disease. Mild superimposed congestive changes is less likely but not excluded. There is a stable 5 mm nodule in the left lower lung field corresponding to the calcified granuloma seen on CT dated 01/29/2007. Or there is no focal consolidation, pleural effusion, or pneumothorax. There is mild cardiomegaly, increased from prior study. Median sternotomy wires the mechanical cardiac bowel, and CABG clips noted. There is atherosclerotic calcification of the aortic arch. Left shoulder arthroplasty. There is degenerative changes of the spine. No acute fracture IMPRESSION: Diffuse interstitial coarsening, chronic and likely related to underlying interstitial lung disease. No focal consolidation. Mild cardiomegaly with possible mild congestive changes. Electronically Signed   By: Anner Crete M.D.   On: 07/19/2016 21:17    EKG:   Orders placed or performed in visit on 05/31/12  . EKG 12-Lead    ASSESSMENT AND PLAN:   82 year old female with past medical history significant for BOOP, diabetes, hypertension, history of aortic valve replacement with bovine valve, gout presents to the hospital secondary to sepsis from pneumonia  #1 sepsis-secondary to community-acquired pneumonia. Blood cultures are negative . -Continue Rocephin and azithromycin. -Continue oxygen support. Off o2 today, Improving sepsis  #2 acute hypoxic respiratory failure-not on home oxygen. Secondary to pneumonia and possible underlying CHF. -Lasix added. Echocardiogram with EF 50%, but has grade 2 diastolic dysfunction. History of prior aortic valve replacement  with bovine valve. -Incentive spirometry and off o2 today -Cough medications added -Steroids - taper oral steroids and nebs for reactive airway disease and wheezing- improved today  #3 hypertension-continue home medications. On clonidine, Lasix, Toprol. Increased toprol dose today  #4 diabetes mellitus- sugars are elevated, started lantus, novolog, also on SSI - hold glipizide for now  #5 GERD-on Protonix  #6 DVT prophylaxis-on Lovenox  #7 Elevated troponin- demand ischemia from sepsis, troponins plataeued  Physical therapy consult today- recommended home health Discharge tomorrow or the day after  All the records are reviewed and case discussed with Care Management/Social Workerr. Management plans discussed with the patient, family and they are in agreement.  CODE STATUS: Full Code  TOTAL TIME TAKING CARE OF THIS PATIENT: 38 minutes.   POSSIBLE D/C IN 1-2 DAYS, DEPENDING ON CLINICAL CONDITION.   Gladstone Lighter M.D on 07/21/2016 at 1:08 PM  Between 7am to 6pm - Pager - [phone]  After 6pm go to [email or web address] - password [email or web address] [facility]  Office  [phone]  CC: Primary care physician; Gayland Curry, MD

## 2016-07-21 NOTE — Care Management (Signed)
Physical therapy consult is pending.  home 02 assessment documentation is pending.  Patient very firmly states that she will not  agree to any home oxygen because the last time she had it, agency would not pick it up when she did not need it anymore. Her son lives with her and is there 24/7.  Patient is very independent and very proud of this fact, as she should be.  Last week she prepared a meal totally with any help from anyone for 20 people.

## 2016-07-22 LAB — CBC
HEMATOCRIT: 34 % — AB (ref 35.0–47.0)
HEMOGLOBIN: 11.5 g/dL — AB (ref 12.0–16.0)
MCH: 33.4 pg (ref 26.0–34.0)
MCHC: 33.7 g/dL (ref 32.0–36.0)
MCV: 99.1 fL (ref 80.0–100.0)
Platelets: 97 10*3/uL — ABNORMAL LOW (ref 150–440)
RBC: 3.43 MIL/uL — AB (ref 3.80–5.20)
RDW: 15.8 % — ABNORMAL HIGH (ref 11.5–14.5)
WBC: 8.1 10*3/uL (ref 3.6–11.0)

## 2016-07-22 LAB — CULTURE, RESPIRATORY W GRAM STAIN: Culture: NORMAL

## 2016-07-22 LAB — BASIC METABOLIC PANEL
Anion gap: 9 (ref 5–15)
BUN: 52 mg/dL — AB (ref 6–20)
CHLORIDE: 105 mmol/L (ref 101–111)
CO2: 23 mmol/L (ref 22–32)
Calcium: 8.9 mg/dL (ref 8.9–10.3)
Creatinine, Ser: 1.57 mg/dL — ABNORMAL HIGH (ref 0.44–1.00)
GFR calc Af Amer: 34 mL/min — ABNORMAL LOW (ref >60)
GFR calc non Af Amer: 29 mL/min — ABNORMAL LOW (ref >60)
GLUCOSE: 339 mg/dL — AB (ref 65–99)
POTASSIUM: 3.7 mmol/L (ref 3.5–5.1)
Sodium: 137 mmol/L (ref 135–145)

## 2016-07-22 LAB — GLUCOSE, CAPILLARY: Glucose-Capillary: 352 mg/dL — ABNORMAL HIGH (ref 65–99)

## 2016-07-22 MED ORDER — PREDNISONE 10 MG (21) PO TBPK: 10.0000 mg | ORAL_TABLET | Freq: Every day | ORAL | 0 refills | Status: DC

## 2016-07-22 MED ORDER — AMLODIPINE BESYLATE 5 MG PO TABS: 5.0000 mg | ORAL_TABLET | Freq: Every day | ORAL | 0 refills | Status: DC

## 2016-07-22 MED ORDER — INSULIN GLARGINE 100 UNIT/ML ~~LOC~~ SOLN
30.0000 [IU] | Freq: Every day | SUBCUTANEOUS | Status: DC
  Administered 2016-07-22: 30 [IU] via SUBCUTANEOUS
  Filled 2016-07-22: qty 0.3

## 2016-07-22 MED ORDER — AMLODIPINE BESYLATE 5 MG PO TABS
5.0000 mg | ORAL_TABLET | Freq: Every day | ORAL | Status: DC
  Administered 2016-07-22: 5 mg via ORAL
  Filled 2016-07-22: qty 1

## 2016-07-22 MED ORDER — BENZONATATE 200 MG PO CAPS: 200.0000 mg | ORAL_CAPSULE | Freq: Three times a day (TID) | ORAL | 0 refills | Status: DC | PRN

## 2016-07-22 MED ORDER — AZITHROMYCIN 500 MG PO TABS: 500.0000 mg | ORAL_TABLET | Freq: Every day | ORAL | 0 refills | Status: DC

## 2016-07-22 MED ORDER — METOPROLOL SUCCINATE ER 100 MG PO TB24: 100.0000 mg | ORAL_TABLET | Freq: Two times a day (BID) | ORAL | 2 refills | Status: DC

## 2016-07-22 MED ORDER — IPRATROPIUM-ALBUTEROL 0.5-2.5 (3) MG/3ML IN SOLN
3.0000 mL | Freq: Three times a day (TID) | RESPIRATORY_TRACT | Status: DC
  Administered 2016-07-22: 3 mL via RESPIRATORY_TRACT
  Filled 2016-07-22: qty 3

## 2016-07-22 MED ORDER — ENOXAPARIN SODIUM 30 MG/0.3ML ~~LOC~~ SOLN: 30.0000 mg | SUBCUTANEOUS | Status: DC

## 2016-07-22 MED ORDER — CEFUROXIME AXETIL 500 MG PO TABS: 500.0000 mg | ORAL_TABLET | Freq: Two times a day (BID) | ORAL | 0 refills | Status: DC

## 2016-07-22 MED ORDER — FUROSEMIDE 40 MG PO TABS: 40.0000 mg | ORAL_TABLET | Freq: Every day | ORAL | 2 refills | Status: DC

## 2016-07-22 NOTE — Progress Notes (Signed)
Physical Therapy Treatment Patient Details Name: [name] [name] MRN: [id number] DOB: 05-08-1929 Today's Date: 07/22/2016    History of Present Illness Pt is a 82 y.o. female who presents with Weakness and cough for the past 2 weeks. Is been getting progressively worse. Patient has significant sputum production. Here in the ED she was found to meet sepsis criteria. Chest imaging did not show focal pneumonia, but did show interstitial disease. This was read as similar to prior studies, but given her clinical symptoms was felt that pneumonia as a source of her sepsis and hospitalists were called for admission.    PT Comments    Pt ambulated 2 x 30-40' without AD with SBA/CGA.  Pt with min instability during gait training with drifting L/R.  Educated pt and pt's family regarding recommendation for use of AD with amb secondary to elevated fall risk, pt remains resistant to use.  Educated pt/family regarding recommendation of HHPT to follow pt after discharge secondary to instability with gait and elevated fall risk for gait/balance training and assessment for and practice with appropriate AD.  Case manager educated on PT recommendation.       Follow Up Recommendations  Home health PT     Equipment Recommendations  Rolling walker with 5" wheels    Recommendations for Other Services       Precautions / Restrictions Precautions Precautions: Fall Restrictions Weight Bearing Restrictions: No    Mobility  Bed Mobility                  Transfers Overall transfer level: Needs assistance Equipment used: None Transfers: Sit to/from Stand Sit to Stand: Supervision            Ambulation/Gait Ambulation/Gait assistance: Supervision Ambulation Distance (Feet): 40 Feet Assistive device: None Gait Pattern/deviations: Decreased step length - right;Decreased step length - left;Drifts right/left   Gait velocity interpretation: <1.8 ft/sec, indicative of risk for recurrent  falls General Gait Details: Min instability with gait with mild drifting L/R and slow cadence   Stairs            Wheelchair Mobility    Modified Rankin (Stroke Patients Only)       Balance Overall balance assessment: Needs assistance Sitting-balance support: No upper extremity supported Sitting balance-Leahy Scale: Good       Standing balance-Leahy Scale: Fair                      Cognition Arousal/Alertness: Awake/alert Behavior During Therapy: WFL for tasks assessed/performed Overall Cognitive Status: Within Functional Limits for tasks assessed                      Exercises Other Exercises Other Exercises: Static balance training with feet apart, together, and semi tandem with eyes open/closed, head still/head turns with mild instability    General Comments        Pertinent Vitals/Pain Pain Assessment: No/denies pain    Home Living                      Prior Function            PT Goals (current goals can now be found in the care plan section) Progress towards PT goals: Progressing toward goals    Frequency    Min 2X/week      PT Plan Current plan remains appropriate    Co-evaluation  End of Session Equipment Utilized During Treatment: Gait belt Activity Tolerance: Patient limited by fatigue Patient left: in chair;with call bell/phone within reach;with family/visitor present;Other (comment) (case manager with pt at end of session)     Time: 1140-1210 PT Time Calculation (min) (ACUTE ONLY): 30 min  Charges:  $Therapeutic Exercise: 8-22 mins $Therapeutic Activity: 8-22 mins                    G Codes:      DRoyetta Asal PT, DPT 07/22/16, 12:27 PM

## 2016-07-22 NOTE — Progress Notes (Signed)
Inpatient Diabetes Program Recommendations  AACE/ADA: New Consensus Statement on Inpatient Glycemic Control (2015)  Target Ranges:  Prepandial:   less than 140 mg/dL      Peak postprandial:   less than 180 mg/dL (1-2 hours)      Critically ill patients:  140 - 180 mg/dL  Results for [name] [name], [name] [name] (MRN [id number]) as of 07/22/2016 08:11  Ref. Range 07/22/2016 03:51  Glucose Latest Ref Range: 65 - 99 mg/dL 339 (H)   Results for [name] [name], [name] [name] (MRN [id number]) as of 07/22/2016 08:11  Ref. Range 07/21/2016 08:18 07/21/2016 11:39 07/21/2016 16:46 07/21/2016 20:36  Glucose-Capillary Latest Ref Range: 65 - 99 mg/dL 323 (H) 360 (H) 394 (H) 256 (H)   Review of Glycemic Control  Diabetes history: DM2 Outpatient Diabetes medications: Glipizide 10 mg TID with meals, Lantus 30 units QHS, Humalog TID with meals (no dose noted on home medication list) Current orders for Inpatient glycemic control: Lantus 20 units daily, Novolog 0-9 units TID with meals, Novolog 0-5 units QHS, Novolog 5 units TID with meals for meal coverage  Inpatient Diabetes Program Recommendations: Insulin - Basal: Please consider increasing Lantus to 30 units daily. Insulin - Meal Coverage: Please consider increasing meal coverage to Novolog 8 units TID with meals.  Thanks, Barnie Alderman, RN, MSN, CDE Diabetes Coordinator Inpatient Diabetes Program [phone] (Team Pager from [place] to [place]) [phone] ([facility] office) [phone] [facility] office) [phone] [facility] office)

## 2016-07-22 NOTE — Progress Notes (Signed)
Pharmacy Note - Anticoagulation  Patient with orders for enoxaparin 40mg  SQ Q24H for VTE prophylaxis  Estimated Creatinine Clearance: 26.9 mL/min (by C-G formula based on SCr of 1.57 mg/dL (H)).  Will adjust to enoxaparin 30mg  SQ Q24H for CrCl < 24ml/min  Rexene Edison, PharmD Clinical Pharmacist  07/22/2016 11:03 AM

## 2016-07-22 NOTE — Care Management (Signed)
patient is for discharge and she is cautiously accepting home health services.  She is in agreement with nurse, but not so sure about physical therapy.  Discussed with patient and family that would obtain order for both and patient could decide when contacted about whether she would want physical therapy to initiate service.  Referral to [facility] as agency is in network with patient's insurance.

## 2016-07-22 NOTE — Progress Notes (Signed)
Patient discharged home as ordered,instructions explained and well understood,escorted by son and staff member via wheel chair.Vital signs within normal limits.

## 2016-07-22 NOTE — Discharge Summary (Signed)
[facility] [place] NAME: [name] [name]    MR#:  [id number]  DATE OF BIRTH:  08/23/1929  DATE OF ADMISSION:  07/19/2016   ADMITTING PHYSICIAN: Lance Coon, MD  DATE OF DISCHARGE: 07/22/2016  1:10 PM  PRIMARY CARE PHYSICIAN: ALDRIDGE,BARBARA, MD   ADMISSION DIAGNOSIS:   Elevated troponin [R79.89] Sepsis, due to unspecified organism ([place]) [A41.9]  DISCHARGE DIAGNOSIS:   Principal Problem:   Sepsis ([place]) Active Problems:   Diabetes ([place])   Essential hypertension   RENAL FAILURE, CHRONIC   CAP (community acquired pneumonia)   Elevated troponin   SECONDARY DIAGNOSIS:   History reviewed. No pertinent past medical history.  HOSPITAL COURSE:   82 year old female with past medical history significant for BOOP, diabetes, hypertension, history of aortic valve replacement with bovine valve, gout presents to the hospital secondary to sepsis from pneumonia  #1 sepsis-secondary to community-acquired pneumonia. Blood cultures are negative . -received Rocephin and azithromycin. Changed to oral ceftin and azithromycin - Off o2 now, Improving sepsis  #2 acute hypoxic respiratory failure-not on home oxygen. Secondary to pneumonia and possible underlying CHF. -Lasix added. Echocardiogram with EF 50%, but has grade 2 diastolic dysfunction. History of prior aortic valve replacement with bovine valve. -Incentive spirometry and off o2 at discharge -Cough medications added -Steroids - taper oral steroids and nebs for reactive airway disease and wheezing- much improved at discharge  #3 hypertension-continue home medications. On clonidine, Lasix, Toprol. Increased toprol dose and added norvasc at discharge  #4 diabetes mellitus- sugars are elevated due to steroids, restarted home dose of lantus, novolog, also glipizide-   #5 GERD-on PPI  #6 Elevated troponin- demand ischemia from sepsis, troponins plataeued  Physical therapy consulted-  recommended home health Discharge today as patient feels much better  DISCHARGE CONDITIONS:   Guarded  CONSULTS OBTAINED:   None  DRUG ALLERGIES:   Allergies  Allergen Reactions  . Codeine Nausea Only  . Sulfonamide Derivatives Other (See Comments)    Reaction: unknown   DISCHARGE MEDICATIONS:     Medication List    TAKE these medications   allopurinol 300 MG tablet Commonly known as:  ZYLOPRIM Take 300 mg by mouth daily.   amLODipine 5 MG tablet Commonly known as:  NORVASC Take 1 tablet (5 mg total) by mouth daily. Start taking on:  07/23/2016   aspirin EC 81 MG tablet Take 81 mg by mouth daily.   azithromycin 500 MG tablet Commonly known as:  ZITHROMAX Take 1 tablet (500 mg total) by mouth daily. X 3 more days   benzonatate 200 MG capsule Commonly known as:  TESSALON Take 1 capsule (200 mg total) by mouth 3 (three) times daily as needed for cough.   cefUROXime 500 MG tablet Commonly known as:  CEFTIN Take 1 tablet (500 mg total) by mouth 2 (two) times daily with a meal. X 5 more days   cholecalciferol 1000 units tablet Commonly known as:  VITAMIN D Take 1,000 Units by mouth daily at 12 noon.   cloNIDine 0.1 MG tablet Commonly known as:  CATAPRES Take 0.1-0.2 mg by mouth 2 (two) times daily. 0.1mg  in the morning and 0.2mg  at bedtime   docusate sodium 100 MG capsule Commonly known as:  COLACE Take 100 mg by mouth at bedtime.   furosemide 40 MG tablet Commonly known as:  LASIX Take 1 tablet (40 mg total) by mouth daily. Start taking on:  07/23/2016 What changed:  medication strength  how much  to take   gabapentin 100 MG capsule Commonly known as:  NEURONTIN Take 100 mg by mouth 3 (three) times daily.   glipiZIDE 10 MG tablet Commonly known as:  GLUCOTROL Take 10 mg by mouth 3 (three) times daily.   HUMALOG 100 UNIT/ML injection Generic drug:  insulin lispro Inject 1 Dose into the skin 3 (three) times daily as needed for high blood sugar.    insulin glargine 100 UNIT/ML injection Commonly known as:  LANTUS Inject 30 Units into the skin at bedtime.   levothyroxine 100 MCG tablet Commonly known as:  SYNTHROID, LEVOTHROID Take 100 mcg by mouth daily.   metoprolol succinate 100 MG 24 hr tablet Commonly known as:  TOPROL-XL Take 1 tablet (100 mg total) by mouth 2 (two) times daily. What changed:  medication strength  how much to take   multivitamin with minerals Tabs tablet Take 1 tablet by mouth daily at 12 noon.   omega-3 acid ethyl esters 1 g capsule Commonly known as:  LOVAZA Take 1 g by mouth daily at 12 noon.   omeprazole 20 MG capsule Commonly known as:  PRILOSEC Take 20 mg by mouth at bedtime.   pravastatin 80 MG tablet Commonly known as:  PRAVACHOL Take 80 mg by mouth at bedtime.   predniSONE 10 MG (21) Tbpk tablet Commonly known as:  STERAPRED UNI-PAK 21 TAB Take 1 tablet (10 mg total) by mouth daily. 4 tabs PO x 1 day 3 tabs PO x 1 day 2 tabs PO x 1 day 1 tab PO x 1 day and stop   rOPINIRole 0.5 MG tablet Commonly known as:  REQUIP Take 1 mg by mouth at bedtime.   triamcinolone cream 0.1 % Commonly known as:  KENALOG Apply 1 application topically 2 (two) times daily.   vitamin B-12 1000 MCG tablet Commonly known as:  CYANOCOBALAMIN Take 1,000 mcg by mouth daily at 12 noon.        DISCHARGE INSTRUCTIONS:   1. PCP f/u in 1-2 weeks 2. Cardiology f/u in 3 weeks 3. Home health  DIET:   Cardiac diet and Diabetic diet  ACTIVITY:   Activity as tolerated  OXYGEN:   Home Oxygen: No.  Oxygen Delivery: room air  DISCHARGE LOCATION:   home  With home health  If you experience worsening of your admission symptoms, develop shortness of breath, life threatening emergency, suicidal or homicidal thoughts you must seek medical attention immediately by calling 911 or calling your MD immediately  if symptoms less severe.  You Must read complete instructions/literature along with all the  possible adverse reactions/side effects for all the Medicines you take and that have been prescribed to you. Take any new Medicines after you have completely understood and accpet all the possible adverse reactions/side effects.   Please note  You were cared for by a hospitalist during your hospital stay. If you have any questions about your discharge medications or the care you received while you were in the hospital after you are discharged, you can call the unit and asked to speak with the hospitalist on call if the hospitalist that took care of you is not available. Once you are discharged, your primary care physician will handle any further medical issues. Please note that NO REFILLS for any discharge medications will be authorized once you are discharged, as it is imperative that you return to your primary care physician (or establish a relationship with a primary care physician if you do not have one) for your  aftercare needs so that they can reassess your need for medications and monitor your lab values.    On the day of Discharge:  VITAL SIGNS:   Blood pressure (!) 187/85, pulse 90, temperature 98 F (36.7 C), temperature source Oral, resp. rate 18, height 5\' 4"  (1.626 m), weight 80.4 kg (177 lb 3.2 oz), SpO2 92 %.  PHYSICAL EXAMINATION:    GENERAL:  82 y.o.-year-old patient lying in the bed with no acute distress.  EYES: Pupils equal, round, reactive to light and accommodation. No scleral icterus. Extraocular muscles intact.  HEENT: Head atraumatic, normocephalic. Oropharynx and nasopharynx clear.  NECK:  Supple, no jugular venous distention. No thyroid enlargement, no tenderness.  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 No use of accessory muscles of respiration.  CARDIOVASCULAR: S1, S2 normal. No  rubs, or gallops. 3/6 systolic murmur present ABDOMEN: Soft,  nontender, nondistended. Bowel sounds present. No organomegaly or mass.  EXTREMITIES: No pedal edema, cyanosis, or clubbing.  NEUROLOGIC: Cranial nerves II through XII are intact. Muscle strength 5/5 in all extremities. Sensation intact. Gait not checked. Global weakness noted PSYCHIATRIC: The patient is alert and oriented x 3.  SKIN: No obvious rash, lesion, or ulcer.    DATA REVIEW:   CBC  Recent Labs Lab 07/22/16 0351  WBC 8.1  HGB 11.5*  HCT 34.0*  PLT 97*    Chemistries   Recent Labs Lab 07/19/16 2016  07/22/16 0351  NA 134*  < > 137  K 4.3  < > 3.7  CL 102  < > 105  CO2 21*  < > 23  GLUCOSE 145*  < > 339*  BUN 28*  < > 52*  CREATININE 1.69*  < > 1.57*  CALCIUM 8.9  < > 8.9  AST 27  --   --   ALT 11*  --   --   ALKPHOS 67  --   --   BILITOT 1.0  --   --   < > = values in this interval not displayed.   Microbiology Results  Results for orders placed or performed during the hospital encounter of 07/19/16  Urine culture     Status: Abnormal   Collection Time: 07/19/16  8:16 PM  Result Value Ref Range Status   Specimen Description URINE, CLEAN CATCH  Final   Special Requests NONE  Final   Culture MULTIPLE SPECIES PRESENT, SUGGEST RECOLLECTION (A)  Final   Report Status 07/21/2016 FINAL  Final  Blood Culture (routine x 2)     Status: None (Preliminary result)   Collection Time: 07/19/16  8:17 PM  Result Value Ref Range Status   Specimen Description BLOOD BLOOD RIGHT HAND  Final   Special Requests BOTTLES DRAWN AEROBIC AND ANAEROBIC [place]  Final   Culture NO GROWTH 3 DAYS  Final   Report Status PENDING  Incomplete  Blood Culture (routine x 2)     Status: None (Preliminary result)   Collection Time: 07/19/16  8:20 PM  Result Value Ref Range Status   Specimen Description BLOOD RIGHT ANTECUBITAL  Final   Special Requests BOTTLES DRAWN AEROBIC AND ANAEROBIC 5CC  Final   Culture NO GROWTH 3 DAYS  Final   Report Status PENDING  Incomplete  Culture,  sputum-assessment     Status: None   Collection Time: 07/20/16  2:06 AM  Result Value Ref Range Status   Specimen Description  SPUTUM  Final   Special Requests NONE  Final   Sputum evaluation THIS SPECIMEN IS ACCEPTABLE FOR SPUTUM CULTURE  Final   Report Status 07/20/2016 FINAL  Final  Culture, respiratory (NON-Expectorated)     Status: None   Collection Time: 07/20/16  2:06 AM  Result Value Ref Range Status   Specimen Description SPUTUM  Final   Special Requests NONE Reflexed from [id number]  Final   Gram Stain   Final    MODERATE WBC PRESENT, PREDOMINANTLY PMN MANY GRAM POSITIVE COCCI IN CLUSTERS IN PAIRS FEW GRAM POSITIVE RODS FEW GRAM NEGATIVE COCCOBACILLI FEW GRAM NEGATIVE RODS RARE GRAM NEGATIVE DIPLOCOCCI    Culture   Final    Consistent with normal respiratory flora. Performed at [facility]    Report Status 07/22/2016 FINAL  Final    RADIOLOGY:  No results found.   Management plans discussed with the patient, family and they are in agreement.  CODE STATUS:     Code Status Orders        Start     Ordered   07/20/16 0307  Full code  Continuous     07/20/16 0306    Code Status History    Date Active Date Inactive Code Status Order ID Comments User Context   07/20/2016  3:06 AM 07/20/2016 12:39 PM Full Code [id number]  Lance Coon, MD Inpatient    Advance Directive Documentation   Flowsheet Row Most Recent Value  Type of Advance Directive  Living will  Pre-existing out of facility DNR order (yellow form or pink MOST form)  No data  "MOST" Form in Place?  No data      TOTAL TIME TAKING CARE OF THIS PATIENT: 38 minutes.    Kaysee Hergert M.D on 07/22/2016 at 3:00 PM  Between 7am to 6pm - Pager - [phone]  After 6pm go to [email or web address] - Proofreader  [employer] [facility]  [facility]  Office  [phone]  CC: Primary care physician; Gayland Curry, MD   Note: This dictation was prepared with Dragon dictation along with  smaller phrase technology. Any transcriptional errors that result from this process are unintentional.

## 2016-07-24 LAB — CULTURE, BLOOD (ROUTINE X 2)
CULTURE: NO GROWTH
CULTURE: NO GROWTH

## 2016-10-21 ENCOUNTER — Other Ambulatory Visit: Payer: Self-pay | Admitting: Family Medicine

## 2016-10-21 DIAGNOSIS — R1012 Left upper quadrant pain: Secondary | ICD-10-CM

## 2016-10-22 ENCOUNTER — Ambulatory Visit
Admission: RE | Admit: 2016-10-22 | Discharge: 2016-10-22 | Disposition: A | Discharge status: Home / Self Care | Payer: Medicare HMO | Source: Ambulatory Visit | Attending: Family Medicine | Admitting: Family Medicine

## 2016-10-22 DIAGNOSIS — I2510 Atherosclerotic heart disease of native coronary artery without angina pectoris: Secondary | ICD-10-CM | POA: Insufficient documentation

## 2016-10-22 DIAGNOSIS — I700 Atherosclerosis of aorta: Secondary | ICD-10-CM | POA: Diagnosis not present

## 2016-10-22 DIAGNOSIS — J849 Interstitial pulmonary disease, unspecified: Secondary | ICD-10-CM | POA: Insufficient documentation

## 2016-10-22 DIAGNOSIS — N281 Cyst of kidney, acquired: Secondary | ICD-10-CM | POA: Insufficient documentation

## 2016-10-22 DIAGNOSIS — R1012 Left upper quadrant pain: Secondary | ICD-10-CM | POA: Insufficient documentation

## 2016-10-22 DIAGNOSIS — K5730 Diverticulosis of large intestine without perforation or abscess without bleeding: Secondary | ICD-10-CM | POA: Diagnosis not present

## 2017-04-20 ENCOUNTER — Other Ambulatory Visit: Payer: Self-pay | Admitting: Family Medicine

## 2017-04-20 DIAGNOSIS — Z1239 Encounter for other screening for malignant neoplasm of breast: Secondary | ICD-10-CM

## 2017-04-22 ENCOUNTER — Other Ambulatory Visit: Payer: Self-pay | Admitting: Family Medicine

## 2017-04-22 DIAGNOSIS — M810 Age-related osteoporosis without current pathological fracture: Secondary | ICD-10-CM

## 2017-08-04 ENCOUNTER — Ambulatory Visit
Admission: RE | Admit: 2017-08-04 | Discharge: 2017-08-04 | Disposition: A | Discharge status: Home / Self Care | Payer: Medicare HMO | Source: Ambulatory Visit | Attending: Family Medicine | Admitting: Family Medicine

## 2017-08-04 DIAGNOSIS — R928 Other abnormal and inconclusive findings on diagnostic imaging of breast: Secondary | ICD-10-CM | POA: Insufficient documentation

## 2017-08-04 DIAGNOSIS — Z1231 Encounter for screening mammogram for malignant neoplasm of breast: Secondary | ICD-10-CM | POA: Insufficient documentation

## 2017-08-04 DIAGNOSIS — M810 Age-related osteoporosis without current pathological fracture: Secondary | ICD-10-CM | POA: Diagnosis present

## 2017-08-04 DIAGNOSIS — Z1239 Encounter for other screening for malignant neoplasm of breast: Secondary | ICD-10-CM

## 2017-08-06 ENCOUNTER — Other Ambulatory Visit: Payer: Self-pay | Admitting: Family Medicine

## 2017-08-06 DIAGNOSIS — R928 Other abnormal and inconclusive findings on diagnostic imaging of breast: Secondary | ICD-10-CM

## 2017-08-06 DIAGNOSIS — N6489 Other specified disorders of breast: Secondary | ICD-10-CM

## 2017-08-11 ENCOUNTER — Ambulatory Visit
Admission: RE | Admit: 2017-08-11 | Discharge: 2017-08-11 | Disposition: A | Discharge status: Home / Self Care | Payer: Medicare HMO | Source: Ambulatory Visit | Attending: Family Medicine | Admitting: Family Medicine

## 2017-08-11 DIAGNOSIS — R928 Other abnormal and inconclusive findings on diagnostic imaging of breast: Secondary | ICD-10-CM

## 2017-08-11 DIAGNOSIS — N6489 Other specified disorders of breast: Secondary | ICD-10-CM

## 2017-08-31 ENCOUNTER — Other Ambulatory Visit: Payer: Self-pay | Admitting: Family Medicine

## 2017-08-31 DIAGNOSIS — R928 Other abnormal and inconclusive findings on diagnostic imaging of breast: Secondary | ICD-10-CM

## 2017-10-21 ENCOUNTER — Emergency Department: Payer: Medicare HMO

## 2017-10-21 ENCOUNTER — Inpatient Hospital Stay
Admission: EM | Admit: 2017-10-21 | Discharge: 2017-10-23 | DRG: 291 | Disposition: A | Discharge status: Home / Self Care | Payer: Medicare HMO | Attending: Internal Medicine | Admitting: Internal Medicine

## 2017-10-21 ENCOUNTER — Other Ambulatory Visit: Payer: Self-pay

## 2017-10-21 DIAGNOSIS — Z90710 Acquired absence of both cervix and uterus: Secondary | ICD-10-CM | POA: Diagnosis not present

## 2017-10-21 DIAGNOSIS — I440 Atrioventricular block, first degree: Secondary | ICD-10-CM | POA: Diagnosis present

## 2017-10-21 DIAGNOSIS — J9601 Acute respiratory failure with hypoxia: Secondary | ICD-10-CM | POA: Diagnosis present

## 2017-10-21 DIAGNOSIS — Z885 Allergy status to narcotic agent status: Secondary | ICD-10-CM | POA: Diagnosis not present

## 2017-10-21 DIAGNOSIS — J449 Chronic obstructive pulmonary disease, unspecified: Secondary | ICD-10-CM | POA: Diagnosis present

## 2017-10-21 DIAGNOSIS — Z66 Do not resuscitate: Secondary | ICD-10-CM | POA: Diagnosis present

## 2017-10-21 DIAGNOSIS — Z952 Presence of prosthetic heart valve: Secondary | ICD-10-CM

## 2017-10-21 DIAGNOSIS — G2581 Restless legs syndrome: Secondary | ICD-10-CM | POA: Diagnosis present

## 2017-10-21 DIAGNOSIS — J810 Acute pulmonary edema: Secondary | ICD-10-CM

## 2017-10-21 DIAGNOSIS — E782 Mixed hyperlipidemia: Secondary | ICD-10-CM | POA: Diagnosis present

## 2017-10-21 DIAGNOSIS — Z7982 Long term (current) use of aspirin: Secondary | ICD-10-CM

## 2017-10-21 DIAGNOSIS — I10 Essential (primary) hypertension: Secondary | ICD-10-CM

## 2017-10-21 DIAGNOSIS — N183 Chronic kidney disease, stage 3 (moderate): Secondary | ICD-10-CM | POA: Diagnosis present

## 2017-10-21 DIAGNOSIS — E1122 Type 2 diabetes mellitus with diabetic chronic kidney disease: Secondary | ICD-10-CM | POA: Diagnosis present

## 2017-10-21 DIAGNOSIS — E860 Dehydration: Secondary | ICD-10-CM | POA: Diagnosis present

## 2017-10-21 DIAGNOSIS — Z882 Allergy status to sulfonamides status: Secondary | ICD-10-CM

## 2017-10-21 DIAGNOSIS — I4510 Unspecified right bundle-branch block: Secondary | ICD-10-CM | POA: Diagnosis present

## 2017-10-21 DIAGNOSIS — Z79890 Hormone replacement therapy (postmenopausal): Secondary | ICD-10-CM

## 2017-10-21 DIAGNOSIS — Z79899 Other long term (current) drug therapy: Secondary | ICD-10-CM | POA: Diagnosis not present

## 2017-10-21 DIAGNOSIS — R0902 Hypoxemia: Secondary | ICD-10-CM

## 2017-10-21 DIAGNOSIS — I130 Hypertensive heart and chronic kidney disease with heart failure and stage 1 through stage 4 chronic kidney disease, or unspecified chronic kidney disease: Principal | ICD-10-CM | POA: Diagnosis present

## 2017-10-21 DIAGNOSIS — Z794 Long term (current) use of insulin: Secondary | ICD-10-CM | POA: Diagnosis not present

## 2017-10-21 DIAGNOSIS — E039 Hypothyroidism, unspecified: Secondary | ICD-10-CM | POA: Diagnosis present

## 2017-10-21 DIAGNOSIS — R0602 Shortness of breath: Secondary | ICD-10-CM | POA: Diagnosis present

## 2017-10-21 DIAGNOSIS — I509 Heart failure, unspecified: Secondary | ICD-10-CM

## 2017-10-21 DIAGNOSIS — I5033 Acute on chronic diastolic (congestive) heart failure: Secondary | ICD-10-CM | POA: Diagnosis present

## 2017-10-21 HISTORY — DX: Essential (primary) hypertension: I10

## 2017-10-21 HISTORY — DX: Disorder of thyroid, unspecified: E07.9

## 2017-10-21 HISTORY — DX: Type 2 diabetes mellitus without complications: E11.9

## 2017-10-21 LAB — URINALYSIS, COMPLETE (UACMP) WITH MICROSCOPIC
Bacteria, UA: NONE SEEN
Bilirubin Urine: NEGATIVE
GLUCOSE, UA: NEGATIVE mg/dL
HGB URINE DIPSTICK: NEGATIVE
KETONES UR: NEGATIVE mg/dL
LEUKOCYTES UA: NEGATIVE
Nitrite: NEGATIVE
PROTEIN: NEGATIVE mg/dL
RBC / HPF: NONE SEEN RBC/hpf (ref 0–5)
SQUAMOUS EPITHELIAL / LPF: NONE SEEN
Specific Gravity, Urine: 1.004 — ABNORMAL LOW (ref 1.005–1.030)
pH: 7 (ref 5.0–8.0)

## 2017-10-21 LAB — INFLUENZA PANEL BY PCR (TYPE A & B)
INFLAPCR: NEGATIVE
Influenza B By PCR: NEGATIVE

## 2017-10-21 LAB — BASIC METABOLIC PANEL
ANION GAP: 6 (ref 5–15)
BUN: 21 mg/dL — AB (ref 6–20)
CHLORIDE: 106 mmol/L (ref 101–111)
CO2: 27 mmol/L (ref 22–32)
Calcium: 8.2 mg/dL — ABNORMAL LOW (ref 8.9–10.3)
Creatinine, Ser: 0.92 mg/dL (ref 0.44–1.00)
GFR calc Af Amer: >60 mL/min (ref >60)
GFR, EST NON AFRICAN AMERICAN: 55 mL/min — AB (ref >60)
GLUCOSE: 115 mg/dL — AB (ref 65–99)
POTASSIUM: 3.7 mmol/L (ref 3.5–5.1)
Sodium: 139 mmol/L (ref 135–145)

## 2017-10-21 LAB — CBC
HEMATOCRIT: 33.6 % — AB (ref 35.0–47.0)
HEMOGLOBIN: 10.3 g/dL — AB (ref 12.0–16.0)
MCH: 30.5 pg (ref 26.0–34.0)
MCHC: 30.6 g/dL — AB (ref 32.0–36.0)
MCV: 99.7 fL (ref 80.0–100.0)
Platelets: 124 10*3/uL — ABNORMAL LOW (ref 150–440)
RBC: 3.37 MIL/uL — ABNORMAL LOW (ref 3.80–5.20)
RDW: 17.7 % — ABNORMAL HIGH (ref 11.5–14.5)
WBC: 6.9 10*3/uL (ref 3.6–11.0)

## 2017-10-21 LAB — TROPONIN I
TROPONIN I: 0.03 ng/mL — AB (ref <0.03)
Troponin I: <0.03 ng/mL (ref <0.03)
Troponin I: <0.03 ng/mL (ref <0.03)

## 2017-10-21 LAB — GLUCOSE, CAPILLARY
GLUCOSE-CAPILLARY: 220 mg/dL — AB (ref 65–99)
Glucose-Capillary: 303 mg/dL — ABNORMAL HIGH (ref 65–99)

## 2017-10-21 LAB — BRAIN NATRIURETIC PEPTIDE: B NATRIURETIC PEPTIDE 5: 418 pg/mL — AB (ref 0.0–100.0)

## 2017-10-21 MED ORDER — HYDRALAZINE HCL 20 MG/ML IJ SOLN: 10.0000 mg | Freq: Four times a day (QID) | INTRAMUSCULAR | Status: DC | PRN

## 2017-10-21 MED ORDER — LEVOTHYROXINE SODIUM 100 MCG PO TABS
100.0000 ug | ORAL_TABLET | Freq: Every day | ORAL | Status: DC
  Administered 2017-10-22 – 2017-10-23 (×2): 100 ug via ORAL
  Filled 2017-10-21 (×2): qty 1

## 2017-10-21 MED ORDER — SODIUM CHLORIDE 0.9% FLUSH
3.0000 mL | Freq: Two times a day (BID) | INTRAVENOUS | Status: DC
  Administered 2017-10-21 – 2017-10-22 (×4): 3 mL via INTRAVENOUS

## 2017-10-21 MED ORDER — ROPINIROLE HCL 1 MG PO TABS
1.0000 mg | ORAL_TABLET | Freq: Every day | ORAL | Status: DC
  Administered 2017-10-21 – 2017-10-22 (×2): 1 mg via ORAL
  Filled 2017-10-21 (×2): qty 1

## 2017-10-21 MED ORDER — PRAVASTATIN SODIUM 40 MG PO TABS
80.0000 mg | ORAL_TABLET | Freq: Every day | ORAL | Status: DC
  Administered 2017-10-22: 80 mg via ORAL
  Filled 2017-10-21: qty 2

## 2017-10-21 MED ORDER — GABAPENTIN 100 MG PO CAPS
100.0000 mg | ORAL_CAPSULE | Freq: Three times a day (TID) | ORAL | Status: DC
  Administered 2017-10-21 – 2017-10-23 (×6): 100 mg via ORAL
  Filled 2017-10-21 (×6): qty 1

## 2017-10-21 MED ORDER — SENNOSIDES-DOCUSATE SODIUM 8.6-50 MG PO TABS: 1.0000 | ORAL_TABLET | Freq: Every evening | ORAL | Status: DC | PRN

## 2017-10-21 MED ORDER — ENOXAPARIN SODIUM 40 MG/0.4ML ~~LOC~~ SOLN
40.0000 mg | SUBCUTANEOUS | Status: DC
  Administered 2017-10-21 – 2017-10-22 (×2): 40 mg via SUBCUTANEOUS
  Filled 2017-10-21 (×2): qty 0.4

## 2017-10-21 MED ORDER — SODIUM CHLORIDE 0.9% FLUSH
3.0000 mL | Freq: Two times a day (BID) | INTRAVENOUS | Status: DC
  Administered 2017-10-21 – 2017-10-22 (×3): 3 mL via INTRAVENOUS

## 2017-10-21 MED ORDER — ADULT MULTIVITAMIN W/MINERALS CH
1.0000 | ORAL_TABLET | Freq: Every day | ORAL | Status: DC
  Administered 2017-10-22: 1 via ORAL
  Filled 2017-10-21: qty 1

## 2017-10-21 MED ORDER — ONDANSETRON HCL 4 MG PO TABS: 4.0000 mg | ORAL_TABLET | Freq: Four times a day (QID) | ORAL | Status: DC | PRN

## 2017-10-21 MED ORDER — CLONIDINE HCL 0.1 MG PO TABS
0.2000 mg | ORAL_TABLET | Freq: Every day | ORAL | Status: DC
  Administered 2017-10-21: 0.2 mg via ORAL
  Filled 2017-10-21: qty 2

## 2017-10-21 MED ORDER — CLONIDINE HCL 0.1 MG PO TABS
0.2000 mg | ORAL_TABLET | Freq: Once | ORAL | Status: AC
  Administered 2017-10-21: 0.2 mg via ORAL
  Filled 2017-10-21: qty 2

## 2017-10-21 MED ORDER — TRAMADOL HCL 50 MG PO TABS: 50.0000 mg | ORAL_TABLET | Freq: Four times a day (QID) | ORAL | Status: DC | PRN

## 2017-10-21 MED ORDER — ACETAMINOPHEN 325 MG PO TABS
650.0000 mg | ORAL_TABLET | Freq: Four times a day (QID) | ORAL | Status: DC | PRN
  Administered 2017-10-21 – 2017-10-22 (×2): 650 mg via ORAL
  Filled 2017-10-21 (×2): qty 2

## 2017-10-21 MED ORDER — OMEGA-3-ACID ETHYL ESTERS 1 G PO CAPS
1.0000 g | ORAL_CAPSULE | Freq: Every day | ORAL | Status: DC
  Administered 2017-10-22: 1 g via ORAL
  Filled 2017-10-21: qty 1

## 2017-10-21 MED ORDER — VITAMIN B-12 1000 MCG PO TABS
1000.0000 ug | ORAL_TABLET | Freq: Every day | ORAL | Status: DC
  Administered 2017-10-22: 1000 ug via ORAL
  Filled 2017-10-21: qty 1

## 2017-10-21 MED ORDER — ONDANSETRON HCL 4 MG/2ML IJ SOLN: 4.0000 mg | Freq: Four times a day (QID) | INTRAMUSCULAR | Status: DC | PRN

## 2017-10-21 MED ORDER — PANTOPRAZOLE SODIUM 40 MG PO TBEC
40.0000 mg | DELAYED_RELEASE_TABLET | Freq: Every day | ORAL | Status: DC
  Administered 2017-10-22 – 2017-10-23 (×2): 40 mg via ORAL
  Filled 2017-10-21 (×2): qty 1

## 2017-10-21 MED ORDER — FUROSEMIDE 40 MG PO TABS: 40.0000 mg | ORAL_TABLET | Freq: Every day | ORAL | Status: DC

## 2017-10-21 MED ORDER — FUROSEMIDE 10 MG/ML IJ SOLN
20.0000 mg | Freq: Every day | INTRAMUSCULAR | Status: DC
  Administered 2017-10-22: 20 mg via INTRAVENOUS
  Filled 2017-10-21 (×2): qty 2

## 2017-10-21 MED ORDER — ALLOPURINOL 300 MG PO TABS
300.0000 mg | ORAL_TABLET | Freq: Every day | ORAL | Status: DC
  Administered 2017-10-22 – 2017-10-23 (×2): 300 mg via ORAL
  Filled 2017-10-21 (×2): qty 1

## 2017-10-21 MED ORDER — ASPIRIN EC 81 MG PO TBEC
81.0000 mg | DELAYED_RELEASE_TABLET | Freq: Every day | ORAL | Status: DC
  Administered 2017-10-22 – 2017-10-23 (×2): 81 mg via ORAL
  Filled 2017-10-21 (×2): qty 1

## 2017-10-21 MED ORDER — DOCUSATE SODIUM 100 MG PO CAPS
100.0000 mg | ORAL_CAPSULE | Freq: Every day | ORAL | Status: DC
  Administered 2017-10-21 – 2017-10-22 (×2): 100 mg via ORAL
  Filled 2017-10-21 (×2): qty 1

## 2017-10-21 MED ORDER — CLONIDINE HCL 0.1 MG PO TABS
0.1000 mg | ORAL_TABLET | Freq: Every morning | ORAL | Status: DC
  Administered 2017-10-22: 0.1 mg via ORAL
  Filled 2017-10-21: qty 1

## 2017-10-21 MED ORDER — ACETAMINOPHEN 650 MG RE SUPP: 650.0000 mg | Freq: Four times a day (QID) | RECTAL | Status: DC | PRN

## 2017-10-21 MED ORDER — SODIUM CHLORIDE 0.9% FLUSH: 3.0000 mL | INTRAVENOUS | Status: DC | PRN

## 2017-10-21 MED ORDER — INSULIN GLARGINE 100 UNIT/ML ~~LOC~~ SOLN
30.0000 [IU] | Freq: Every day | SUBCUTANEOUS | Status: DC
  Administered 2017-10-21 – 2017-10-22 (×2): 30 [IU] via SUBCUTANEOUS
  Filled 2017-10-21 (×3): qty 0.3

## 2017-10-21 MED ORDER — AMLODIPINE BESYLATE 5 MG PO TABS
5.0000 mg | ORAL_TABLET | Freq: Every day | ORAL | Status: DC
  Administered 2017-10-22: 5 mg via ORAL
  Filled 2017-10-21: qty 1

## 2017-10-21 MED ORDER — VITAMIN D 1000 UNITS PO TABS
1000.0000 [IU] | ORAL_TABLET | Freq: Every day | ORAL | Status: DC
  Administered 2017-10-22: 1000 [IU] via ORAL
  Filled 2017-10-21: qty 1

## 2017-10-21 MED ORDER — SODIUM CHLORIDE 0.9 % IV SOLN: 250.0000 mL | INTRAVENOUS | Status: DC | PRN

## 2017-10-21 MED ORDER — INSULIN ASPART 100 UNIT/ML ~~LOC~~ SOLN
0.0000 [IU] | Freq: Three times a day (TID) | SUBCUTANEOUS | Status: DC
  Administered 2017-10-21: 3 [IU] via SUBCUTANEOUS
  Administered 2017-10-22: 5 [IU] via SUBCUTANEOUS
  Administered 2017-10-22: 1 [IU] via SUBCUTANEOUS
  Filled 2017-10-21 (×4): qty 1

## 2017-10-21 MED ORDER — FUROSEMIDE 10 MG/ML IJ SOLN
40.0000 mg | Freq: Once | INTRAMUSCULAR | Status: AC
  Administered 2017-10-21: 40 mg via INTRAVENOUS
  Filled 2017-10-21: qty 4

## 2017-10-21 MED ORDER — METOPROLOL SUCCINATE ER 100 MG PO TB24
100.0000 mg | ORAL_TABLET | Freq: Two times a day (BID) | ORAL | Status: DC
  Administered 2017-10-21 – 2017-10-23 (×4): 100 mg via ORAL
  Filled 2017-10-21 (×4): qty 1

## 2017-10-21 NOTE — H&P (Signed)
[facility] [place] NAME: [name] [name]    MR#:  [id number]  DATE OF BIRTH:  February 04, 1929  DATE OF ADMISSION:  10/21/2017  PRIMARY CARE PHYSICIAN: Gayland Curry, MD   REQUESTING/REFERRING PHYSICIAN: dr [email or web address]  CHIEF COMPLAINT:   SOB HISTORY OF PRESENT ILLNESS:  [name] [name]  is a 83 y.o. female with a known history of diastolic heart failure with preserved ejection fraction and diabetes who presents with above complaint. Patient reports over the past 24 hours she has had shortness of breath, dyspnea exertion and cough. She actually has had these symptoms over the past week however it has worsened over the past 24 hours. She also has lower extremity edema however reports that this is chronic. She does endorse this morning that she had PND and orthopnea. She reports no cough or fevers.  She was brought to the ER for further evaluation. Chest x-ray shows findings of congestive heart failure. She has been given IV Lasix and has diuresed over 500 cc.  PAST MEDICAL HISTORY:   Past Medical History:  Diagnosis Date  . Diabetes mellitus without complication ([place])   . Hypertension   . Thyroid disease     PAST SURGICAL HISTORY:   Past Surgical History:  Procedure Laterality Date  . ABDOMINAL HYSTERECTOMY    . BREAST BIOPSY Right 20+yrs ago   negative    SOCIAL HISTORY:   Social History   Tobacco Use  . Smoking status: Never Smoker  . Smokeless tobacco: Never Used  Substance Use Topics  . Alcohol use: No    Frequency: Never    FAMILY HISTORY:   Family History  Problem Relation Age of Onset  . Breast cancer Mother 43    DRUG ALLERGIES:   Allergies  Allergen Reactions  . Codeine Nausea Only  . Sulfonamide Derivatives Other (See Comments)    Reaction: unknown    REVIEW OF SYSTEMS:   Review of Systems  Constitutional: Negative.  Negative for chills, fever and malaise/fatigue.  HENT: Negative.  Negative for ear discharge,  ear pain, hearing loss, nosebleeds and sore throat.   Eyes: Negative.  Negative for blurred vision and pain.  Respiratory: Positive for cough and shortness of breath. Negative for hemoptysis and wheezing.   Cardiovascular: Positive for orthopnea, leg swelling and PND. Negative for chest pain and palpitations.  Gastrointestinal: Negative.  Negative for abdominal pain, blood in stool, diarrhea, nausea and vomiting.  Genitourinary: Negative.  Negative for dysuria.  Musculoskeletal: Negative.  Negative for back pain.  Skin: Negative.   Neurological: Negative for dizziness, tremors, speech change, focal weakness, seizures and headaches.  Endo/Heme/Allergies: Negative.  Does not bruise/bleed easily.  Psychiatric/Behavioral: Negative.  Negative for depression, hallucinations and suicidal ideas.    MEDICATIONS AT HOME:   Prior to Admission medications   Medication Sig Start Date End Date Taking? Authorizing Provider  allopurinol (ZYLOPRIM) 300 MG tablet Take 300 mg by mouth daily. 06/14/16   [provider]  amLODipine (NORVASC) 5 MG tablet Take 1 tablet (5 mg total) by mouth daily. 07/23/16   Gladstone Lighter, MD  aspirin EC 81 MG tablet Take 81 mg by mouth daily.    [provider]  azithromycin (ZITHROMAX) 500 MG tablet Take 1 tablet (500 mg total) by mouth daily. X 3 more days 07/22/16   Gladstone Lighter, MD  benzonatate (TESSALON) 200 MG capsule Take 1 capsule (200 mg total) by mouth 3 (three) times daily as needed for cough. 07/22/16  Gladstone Lighter, MD  cefUROXime (CEFTIN) 500 MG tablet Take 1 tablet (500 mg total) by mouth 2 (two) times daily with a meal. X 5 more days 07/22/16   Gladstone Lighter, MD  cholecalciferol (VITAMIN D) 1000 units tablet Take 1,000 Units by mouth daily at 12 noon.    [provider]  cloNIDine (CATAPRES) 0.1 MG tablet Take 0.1-0.2 mg by mouth 2 (two) times daily. 0.1mg  in the morning and 0.2mg  at bedtime 06/16/16   [provider]  docusate sodium (COLACE) 100 MG capsule Take 100 mg by mouth at bedtime.    [provider]  furosemide (LASIX) 40 MG tablet Take 1 tablet (40 mg total) by mouth daily. 07/23/16   Gladstone Lighter, MD  gabapentin (NEURONTIN) 100 MG capsule Take 100 mg by mouth 3 (three) times daily. 06/14/16   [provider]  glipiZIDE (GLUCOTROL) 10 MG tablet Take 10 mg by mouth 3 (three) times daily. 06/14/16   [provider]  HUMALOG 100 UNIT/ML injection Inject 1 Dose into the skin 3 (three) times daily as needed for high blood sugar. 06/22/16   [provider]  insulin glargine (LANTUS) 100 UNIT/ML injection Inject 30 Units into the skin at bedtime.    [provider]  levothyroxine (SYNTHROID, LEVOTHROID) 100 MCG tablet Take 100 mcg by mouth daily. 06/14/16   [provider]  metoprolol succinate (TOPROL-XL) 100 MG 24 hr tablet Take 1 tablet (100 mg total) by mouth 2 (two) times daily. 07/22/16   Gladstone Lighter, MD  Multiple Vitamin (MULTIVITAMIN WITH MINERALS) TABS tablet Take 1 tablet by mouth daily at 12 noon.    [provider]  omega-3 acid ethyl esters (LOVAZA) 1 g capsule Take 1 g by mouth daily at 12 noon.    [provider]  omeprazole (PRILOSEC) 20 MG capsule Take 20 mg by mouth at bedtime. 07/07/16   [provider]  pravastatin (PRAVACHOL) 80 MG tablet Take 80 mg by mouth at bedtime. 11/08/15   [provider]  predniSONE (STERAPRED UNI-PAK 21 TAB) 10 MG (21) TBPK tablet Take 1 tablet (10 mg total) by mouth daily. 4 tabs PO x 1 day 3 tabs PO x 1 day 2 tabs PO x 1 day 1 tab PO x 1 day and stop 07/22/16   Gladstone Lighter, MD  rOPINIRole (REQUIP) 0.5 MG tablet Take 1 mg by mouth at bedtime. 05/21/16   [provider]  triamcinolone cream (KENALOG) 0.1 % Apply 1 application topically 2 (two) times daily. 07/07/16   [provider]  vitamin B-12 (CYANOCOBALAMIN) 1000 MCG tablet  Take 1,000 mcg by mouth daily at 12 noon.    [provider]      VITAL SIGNS:  Blood pressure (!) 200/93, pulse 64, temperature 97.8 F (36.6 C), temperature source Oral, resp. rate 15, weight 80.8 kg (178 lb 1.6 oz), SpO2 93 %.  PHYSICAL EXAMINATION:   Physical Exam  Constitutional: She is oriented to person, place, and time and well-developed, well-nourished, and in no distress. No distress.  HENT:  Head: Normocephalic.  Eyes: No scleral icterus.  Neck: Normal range of motion. Neck supple. JVD present. No tracheal deviation present.  Cardiovascular: Normal rate, regular rhythm and normal heart sounds. Exam reveals no gallop and no friction rub.  No murmur heard. Pulmonary/Chest: Effort normal. No respiratory distress. She has no wheezes. She has rales. She exhibits no tenderness.  Expiratory crackles bilaterally at bases    Abdominal: Soft. Bowel sounds are  normal. She exhibits distension. She exhibits no mass. There is no tenderness. There is no rebound and no guarding.  Musculoskeletal: Normal range of motion. She exhibits no edema.  Neurological: She is alert and oriented to person, place, and time.  Skin: Skin is warm. No rash noted. No erythema.  Psychiatric: Affect and judgment normal.      LABORATORY PANEL:   CBC Recent Labs  Lab 10/21/17 1131  WBC 6.9  HGB 10.3*  HCT 33.6*  PLT 124*   ------------------------------------------------------------------------------------------------------------------  Chemistries  Recent Labs  Lab 10/21/17 1131  NA 139  K 3.7  CL 106  CO2 27  GLUCOSE 115*  BUN 21*  CREATININE 0.92  CALCIUM 8.2*   ------------------------------------------------------------------------------------------------------------------  Cardiac Enzymes Recent Labs  Lab 10/21/17 1131  TROPONINI <0.03   ------------------------------------------------------------------------------------------------------------------  RADIOLOGY:   Dg Chest 2 View  Result Date: 10/21/2017 CLINICAL DATA:  83 year old female with difficulty breathing. Initial encounter. EXAM: CHEST  2 VIEW COMPARISON:  07/23/2016. FINDINGS: Diffuse coarsened airspace disease may represent pulmonary edema superimposed upon chronic lung changes. Difficult to exclude superimposed infection in the proper clinical setting. Cardiomegaly post aortic valve replacement. Calcified aorta. Thoracic kyphosis without focal compression fracture. Post left shoulder replacement. IMPRESSION: Diffuse coarsened airspace disease may represent pulmonary edema superimposed upon chronic lung changes. Difficult to exclude superimposed infection in the proper clinical setting (right perihilar region). Cardiomegaly post aortic valve replacement. Aortic Atherosclerosis (ICD10-I70.0). Electronically Signed   By: Genia Del M.D.   On: 10/21/2017 12:04    EKG:  Sinus rhythm no ST elevation or depression  IMPRESSION AND PLAN:   83 year old female with history of diabetes and chronic diastolic heart failure with preserved ejection fraction who presents with shortness of breath.  1. Acute hypoxic respiratory failure in the setting of acute on chronic diastolic heart failure Wean oxygen as tolerated  2. Acute on chronic diastolic heart failure with preserved ejection fraction: Continue IV Lasix Monitor intake and output with daily weight [facility] cardiology consultation requested Repeat echocardiogram  3. Accelerated Essential hypertension Restart outpatient medications including Norvasc, clonidine, metoprolol Hydralazine when necessary 4. Diabetes: Continue ADA diet with sliding scale and Lantus Hold glipizide while in the hospital  5. Hypothyroidism: Continue Synthroid  6. Restless leg syndrome: Continue Requip  7. Hyperlipidemia: Continue Pravachol   All the records are reviewed and case discussed with ED provider. Management plans discussed with the patient and she is in  agreement  CODE STATUS: DNR  TOTAL TIME TAKING CARE OF THIS PATIENT: 45 minutes.    Salome Hautala M.D on 10/21/2017 at 2:38 PM  Between 7am to 6pm - Pager - [phone]  After 6pm go to [email or web address] - password [email or web address] [place] [facility]  Office  [phone]  CC: Primary care physician; Gayland Curry, MD

## 2017-10-21 NOTE — ED Triage Notes (Signed)
Pt arrives [facility] from home for difficulty breathing and chest pressure that began this AM. On RA with EMS pt was 88%, nasal cannula 91%, non-rebreather 100%. Pt arrives on non-rebreather. EMS gave 1 douneb and 125mg  solumedrol. Reports crackles in lung fields. Pt states feeling better after medication administration. States Chest pressure that comes and goes. Sees Dr. Ubaldo Glassing. Pt is alert, oriented. Does NOT usually weary oxygen. Appears to be having a difficult time speaking in complete sentences.

## 2017-10-21 NOTE — Care Management Note (Signed)
Case Management Note  Patient Details  Name: [name] [name] MRN: [id number] Date of Birth: 11/22/1928  Subjective/Objective:                 Patient admitted with  congestive heart failure.  Current oxygen requirement is acute.  Has had home health in the past with [facility].Followed by [facility] cardiology.      Action/Plan:   Expected Discharge Date:                  Expected Discharge Plan:     In-House Referral:     Discharge planning Services     Post Acute Care Choice:    Choice offered to:     DME Arranged:    DME Agency:     HH Arranged:    HH Agency:     Status of Service:     If discussed at [employer] of Stay Meetings, dates discussed:    Additional Comments:  Katrina Stack, RN 10/21/2017, 4:41 PM

## 2017-10-21 NOTE — ED Provider Notes (Signed)
[facility] Emergency Department Provider Note ____________________________________________   First MD Initiated Contact with Patient 10/21/17 1136     (approximate)  I have reviewed the triage vital signs and the nursing notes.   HISTORY  Chief Complaint Shortness of Breath    HPI [name] [name] is a 83 y.o. female with a history of diabetes, hypertension, diastolic dysfunction, and other past medical history as noted below who presents with shortness of breath, gradual onset over several weeks, but acutely worsened since this morning, worse with exertion and somewhat improved with rest, and associated with chronic lower extremity swelling.  Patient reports a chronic mild cough but no acute change in this, and no recent fever or chills.  She also reports left-sided chest pressure-like discomfort.   Past Medical History:  Diagnosis Date  . Diabetes mellitus without complication ([place])   . Hypertension   . Thyroid disease     Patient Active Problem List   Diagnosis Date Noted  . Sepsis ([place]) 07/20/2016  . CAP (community acquired pneumonia) 07/20/2016  . Elevated troponin 07/20/2016  . RENAL FAILURE, CHRONIC 07/17/2008  . Diabetes ([place]) 06/12/2008  . OSTEOPENIA 06/12/2008  . Essential hypertension 10/12/2007  . INTERSTITIAL LUNG DISEASE 10/12/2007  . OTH SPEC ALVEOL&PARIETOALVEOL PNEUMONOPATHIES 10/12/2007  . HYPERGLYCEMIA 10/12/2007    Past Surgical History:  Procedure Laterality Date  . ABDOMINAL HYSTERECTOMY    . BREAST BIOPSY Right 20+yrs ago   negative    Prior to Admission medications   Medication Sig Start Date End Date Taking? Authorizing Provider  allopurinol (ZYLOPRIM) 300 MG tablet Take 300 mg by mouth daily. 06/14/16   [provider]  amLODipine (NORVASC) 5 MG tablet Take 1 tablet (5 mg total) by mouth daily. 07/23/16   Gladstone Lighter, MD  aspirin EC 81 MG tablet Take 81 mg by mouth daily.    [provider]  azithromycin (ZITHROMAX) 500 MG tablet Take 1 tablet (500 mg total) by mouth daily. X 3 more days 07/22/16   Gladstone Lighter, MD  benzonatate (TESSALON) 200 MG capsule Take 1 capsule (200 mg total) by mouth 3 (three) times daily as needed for cough. 07/22/16   Gladstone Lighter, MD  cefUROXime (CEFTIN) 500 MG tablet Take 1 tablet (500 mg total) by mouth 2 (two) times daily with a meal. X 5 more days 07/22/16   Gladstone Lighter, MD  cholecalciferol (VITAMIN D) 1000 units tablet Take 1,000 Units by mouth daily at 12 noon.    [provider]  cloNIDine (CATAPRES) 0.1 MG tablet Take 0.1-0.2 mg by mouth 2 (two) times daily. 0.1mg  in the morning and 0.2mg  at bedtime 06/16/16   [provider]  docusate sodium (COLACE) 100 MG capsule Take 100 mg by mouth at bedtime.    [provider]  furosemide (LASIX) 40 MG tablet Take 1 tablet (40 mg total) by mouth daily. 07/23/16   Gladstone Lighter, MD  gabapentin (NEURONTIN) 100 MG capsule Take 100 mg by mouth 3 (three) times daily. 06/14/16   [provider]  glipiZIDE (GLUCOTROL) 10 MG tablet Take 10 mg by mouth 3 (three) times daily. 06/14/16   [provider]  HUMALOG 100 UNIT/ML injection Inject 1 Dose into the skin 3 (three) times daily as needed for high blood sugar. 06/22/16   [provider]  insulin glargine (LANTUS) 100 UNIT/ML injection Inject 30 Units into the skin at bedtime.    [provider]  levothyroxine (SYNTHROID, LEVOTHROID) 100 MCG tablet Take  100 mcg by mouth daily. 06/14/16   [provider]  metoprolol succinate (TOPROL-XL) 100 MG 24 hr tablet Take 1 tablet (100 mg total) by mouth 2 (two) times daily. 07/22/16   Gladstone Lighter, MD  Multiple Vitamin (MULTIVITAMIN WITH MINERALS) TABS tablet Take 1 tablet by mouth daily at 12 noon.    [provider]  omega-3 acid ethyl esters (LOVAZA) 1 g capsule Take 1 g by mouth daily at 12 noon.    [provider]   omeprazole (PRILOSEC) 20 MG capsule Take 20 mg by mouth at bedtime. 07/07/16   [provider]  pravastatin (PRAVACHOL) 80 MG tablet Take 80 mg by mouth at bedtime. 11/08/15   [provider]  predniSONE (STERAPRED UNI-PAK 21 TAB) 10 MG (21) TBPK tablet Take 1 tablet (10 mg total) by mouth daily. 4 tabs PO x 1 day 3 tabs PO x 1 day 2 tabs PO x 1 day 1 tab PO x 1 day and stop 07/22/16   Gladstone Lighter, MD  rOPINIRole (REQUIP) 0.5 MG tablet Take 1 mg by mouth at bedtime. 05/21/16   [provider]  triamcinolone cream (KENALOG) 0.1 % Apply 1 application topically 2 (two) times daily. 07/07/16   [provider]  vitamin B-12 (CYANOCOBALAMIN) 1000 MCG tablet Take 1,000 mcg by mouth daily at 12 noon.    [provider]    Allergies Codeine and Sulfonamide derivatives  Family History  Problem Relation Age of Onset  . Breast cancer Mother 54    Social History Social History   Tobacco Use  . Smoking status: Never Smoker  . Smokeless tobacco: Never Used  Substance Use Topics  . Alcohol use: No    Frequency: Never  . Drug use: Not on file    Review of Systems  Constitutional: No fever/chills. Eyes: No visual changes. ENT: No sore throat. Cardiovascular: Positive for chest pain. Respiratory: Positive for shortness of breath. Gastrointestinal: No nausea, no vomiting.  No diarrhea.  Genitourinary: Negative for dysuria.  Musculoskeletal: Negative for back pain. Skin: Negative for rash. Neurological: Negative for headache.   ____________________________________________   PHYSICAL EXAM:  VITAL SIGNS: ED Triage Vitals  Enc Vitals Group     BP 10/21/17 1123 (!) 161/70     Pulse Rate 10/21/17 1130 68     Resp 10/21/17 1130 (!) 22     Temp --      Temp src --      SpO2 10/21/17 1130 100 %     Weight 10/21/17 1129 178 lb 1.6 oz (80.8 kg)     Height --      Head Circumference --      Peak Flow --      Pain Score 10/21/17 1128 5      Pain Loc --      Pain Edu? --      Excl. in [place]? --     Constitutional: Alert and oriented.  Uncomfortable appearing but in no acute distress. Eyes: Conjunctivae are normal.  Head: Atraumatic. Nose: No congestion/rhinnorhea. Mouth/Throat: Mucous membranes are dry.   Neck: Normal range of motion.  Cardiovascular: Normal rate, regular rhythm. Grossly normal heart sounds.  Good peripheral circulation. Respiratory: Slightly increased respiratory effort.  No retractions.  Bilateral rales left > right. Gastrointestinal: Soft and nontender. No distention.  Genitourinary: No flank tenderness. Musculoskeletal: No lower extremity edema.  Extremities warm and well perfused.  Neurologic:  Normal speech and language. No gross focal neurologic deficits are appreciated.  Skin:  Skin is warm and dry. No rash noted. Psychiatric: Mood and affect are normal. Speech and behavior are normal.  ____________________________________________   LABS (all labs ordered are listed, but only abnormal results are displayed)  Labs Reviewed  BASIC METABOLIC PANEL - Abnormal; Notable for the following components:      Result Value   Glucose, Bld 115 (*)    BUN 21 (*)    Calcium 8.2 (*)    GFR calc non Af Amer 55 (*)    All other components within normal limits  CBC - Abnormal; Notable for the following components:   RBC 3.37 (*)    Hemoglobin 10.3 (*)    HCT 33.6 (*)    MCHC 30.6 (*)    RDW 17.7 (*)    Platelets 124 (*)    All other components within normal limits  BRAIN NATRIURETIC PEPTIDE - Abnormal; Notable for the following components:   B Natriuretic Peptide 418.0 (*)    All other components within normal limits  URINALYSIS, COMPLETE (UACMP) WITH MICROSCOPIC - Abnormal; Notable for the following components:   Color, Urine STRAW (*)    APPearance CLEAR (*)    Specific Gravity, Urine 1.004 (*)    All other components within normal limits  TROPONIN I  INFLUENZA PANEL BY PCR (TYPE A & B)    ____________________________________________  EKG  ED ECG REPORT I, Arta Silence, the attending physician, personally viewed and interpreted this ECG.  Date: 10/21/2017 EKG Time: 1129 Rate: 65 Rhythm: normal sinus rhythm QRS Axis: Left axis deviation Intervals: normal ST/T Wave abnormalities: Nonspecific Narrative Interpretation: no evidence of acute ischemia; no significant change when compared to EKG of 05/31/2012  ____________________________________________  RADIOLOGY  CXR: diffuse coarse airspace disease, most consistent with pulmonary edema  ____________________________________________   PROCEDURES  Procedure(s) performed: No    Critical Care performed: No ____________________________________________   INITIAL IMPRESSION / ASSESSMENT AND PLAN / ED COURSE  Pertinent labs & imaging results that were available during my care of the patient were reviewed by me and considered in my medical decision making (see chart for details).  83 year old female with past medical history as noted above presents with gradual onset worsening shortness of breath, and acutely worsened today, associated with hypoxia in the field per EMS.  On review of past medical records in [place], patient was last admitted to the hospital over a year ago for sepsis, and has no history of COPD, but does have a history of grade 2 diastolic dysfunction.  On exam, patient is slightly uncomfortable appearing but in no acute distress, vital signs are normal except for O2 sat mid 90s on O2 by nasal cannula, and significant for dry mucous membranes, but market rales especially on the left side.  Differential includes CHF related to the diastolic dysfunction although patient not overall fluid overloaded, acute pneumonia, bronchitis, or less likely COPD given that patient has no prior diagnosis of this.  Plan: Chest x-ray, labs including cardiac enzymes, and reassess.  The patient reports improvement with  steroid and nebs given in the field.    ----------------------------------------- 2:19 PM on 10/21/2017 -----------------------------------------  Primary result of patient's workup is diffuse opacity on her x-ray most consistent with pulmonary edema.  I gave a dose of IV Lasix.  I consulted with the patient's cardiologist, Dr. Ubaldo Glassing, who agreed with admitting the patient due to her hypoxia, and recommends getting a new echocardiogram as an inpatient.  Patient's blood pressure also increased while she was in  the department and I ordered a dose of clonidine which improved it.  I explained the findings of the workup and the care plan to the patient and her family member and they expressed understanding.  Patient signed out to the hospitalist. ____________________________________________   FINAL CLINICAL IMPRESSION(S) / ED DIAGNOSES  Final diagnoses:  Acute pulmonary edema ([place])  Hypertension, unspecified type  Hypoxia      NEW MEDICATIONS STARTED DURING THIS VISIT:  This SmartLink is deprecated. Use AVSMEDLIST instead to display the medication list for a patient.   Note:  This document was prepared using Dragon voice recognition software and may include unintentional dictation errors.    Arta Silence, MD 10/21/17 1420

## 2017-10-21 NOTE — ED Notes (Signed)
Pt taken to xray 

## 2017-10-21 NOTE — Plan of Care (Signed)
Educate patient about low salt diet and sodium in processed food.

## 2017-10-21 NOTE — Consult Note (Signed)
[facility] Cardiology Consultation Note  Patient ID: [name] [name], MRN: [id number], DOB/AGE: Feb 12, 1929 83 y.o. Admit date: 10/21/2017   Date of Consult: 10/21/2017 Primary Physician: Gayland Curry, MD Primary Cardiologist: Fath  Chief Complaint:  Chief Complaint  Patient presents with  . Shortness of Breath   Reason for Consult: Heart failure  HPI: 83 y.o. female with known aortic valve stenosis status post aortic valve replacement essential hypertension mixed hyperlipidemia and diabetes who has has had chronic kidney disease stage III and COPD or bronchiolitic obliterans with new onset of severe shortness of breath and hypoxia multifactorial in nature including possible hypoxia from exacerbation of bronchiolitis obliterans heart failure.  Patient does have a normal troponin and an EKG showing normal sinus rhythm with right bundle branch block with first-degree AV block and no evidence of myocardial infarction.  The patient has had intravenous Lasix with output somewhat and will continue to have medication management for multiple issues at this time  Past Medical History:  Diagnosis Date  . Diabetes mellitus without complication ([place])   . Hypertension   . Thyroid disease       Surgical History:  Past Surgical History:  Procedure Laterality Date  . ABDOMINAL HYSTERECTOMY    . BREAST BIOPSY Right 20+yrs ago   negative     Home Meds: Prior to Admission medications   Medication Sig Start Date End Date Taking? Authorizing Provider  allopurinol (ZYLOPRIM) 300 MG tablet Take 300 mg by mouth daily. 06/14/16  Yes [provider]  amLODipine (NORVASC) 5 MG tablet Take 1 tablet (5 mg total) by mouth daily. 07/23/16  Yes Gladstone Lighter, MD  aspirin EC 81 MG tablet Take 81 mg by mouth daily.   Yes [provider]  cholecalciferol (VITAMIN D) 1000 units tablet Take 1,000 Units by mouth daily at 12 noon.   Yes [provider]  cloNIDine (CATAPRES)  0.1 MG tablet Take 0.1 mg by mouth 3 (three) times daily.  06/16/16  Yes [provider]  furosemide (LASIX) 40 MG tablet Take 1 tablet (40 mg total) by mouth daily. 07/23/16  Yes Gladstone Lighter, MD  gabapentin (NEURONTIN) 100 MG capsule Take 100 mg by mouth 3 (three) times daily. 06/14/16  Yes [provider]  glipiZIDE (GLUCOTROL) 10 MG tablet Take 10 mg by mouth 3 (three) times daily. 06/14/16  Yes [provider]  insulin detemir (LEVEMIR) 100 UNIT/ML injection Inject 30 Units into the skin at bedtime.   Yes [provider]  insulin lispro protamine-lispro (HUMALOG 50/50 MIX) (50-50) 100 UNIT/ML SUSP injection Inject 2 Units into the skin 2 (two) times daily before a meal.   Yes [provider]  levothyroxine (SYNTHROID, LEVOTHROID) 100 MCG tablet Take 100 mcg by mouth daily. 06/14/16  Yes [provider]  metoprolol succinate (TOPROL-XL) 100 MG 24 hr tablet Take 1 tablet (100 mg total) by mouth 2 (two) times daily. 07/22/16  Yes Gladstone Lighter, MD  Multiple Vitamin (MULTIVITAMIN WITH MINERALS) TABS tablet Take 1 tablet by mouth daily at 12 noon.   Yes [provider]  omega-3 acid ethyl esters (LOVAZA) 1 g capsule Take 1 g by mouth daily at 12 noon.   Yes [provider]  omeprazole (PRILOSEC) 20 MG capsule Take 20 mg by mouth at bedtime. 07/07/16  Yes [provider]  pravastatin (PRAVACHOL) 80 MG tablet Take 80 mg by mouth at bedtime. 11/08/15  Yes [provider]  rOPINIRole (REQUIP) 0.5 MG tablet Take 1 mg  by mouth at bedtime. 05/21/16  Yes [provider]  vitamin B-12 (CYANOCOBALAMIN) 1000 MCG tablet Take 1,000 mcg by mouth daily at 12 noon.   Yes [provider]  azithromycin (ZITHROMAX) 500 MG tablet Take 1 tablet (500 mg total) by mouth daily. X 3 more days Patient not taking: Reported on 10/21/2017 07/22/16   Gladstone Lighter, MD  benzonatate (TESSALON) 200 MG capsule Take 1  capsule (200 mg total) by mouth 3 (three) times daily as needed for cough. Patient not taking: Reported on 10/21/2017 07/22/16   Gladstone Lighter, MD  cefUROXime (CEFTIN) 500 MG tablet Take 1 tablet (500 mg total) by mouth 2 (two) times daily with a meal. X 5 more days Patient not taking: Reported on 10/21/2017 07/22/16   Gladstone Lighter, MD  predniSONE (STERAPRED UNI-PAK 21 TAB) 10 MG (21) TBPK tablet Take 1 tablet (10 mg total) by mouth daily. 4 tabs PO x 1 day 3 tabs PO x 1 day 2 tabs PO x 1 day 1 tab PO x 1 day and stop Patient not taking: Reported on 10/21/2017 07/22/16   Gladstone Lighter, MD  triamcinolone cream (KENALOG) 0.1 % Apply 1 application topically 2 (two) times daily. 07/07/16   [provider]    Inpatient Medications:  . [START ON 10/22/2017] allopurinol  300 mg Oral Daily  . [START ON 10/22/2017] amLODipine  5 mg Oral Daily  . [START ON 10/22/2017] aspirin EC  81 mg Oral Daily  . [START ON 10/22/2017] cholecalciferol  1,000 Units Oral Q1200  . [START ON 10/22/2017] cloNIDine  0.1 mg Oral q morning - 10a  . cloNIDine  0.2 mg Oral QHS  . docusate sodium  100 mg Oral QHS  . enoxaparin (LOVENOX) injection  40 mg Subcutaneous Q24H  . [START ON 10/22/2017] furosemide  20 mg Intravenous Daily  . gabapentin  100 mg Oral TID  . insulin aspart  0-9 Units Subcutaneous TID WC  . insulin glargine  30 Units Subcutaneous QHS  . [START ON 10/22/2017] levothyroxine  100 mcg Oral QAC breakfast  . metoprolol succinate  100 mg Oral BID  . [START ON 10/22/2017] multivitamin with minerals  1 tablet Oral Q1200  . [START ON 10/22/2017] omega-3 acid ethyl esters  1 g Oral Q1200  . [START ON 10/22/2017] pantoprazole  40 mg Oral Daily  . [START ON 10/22/2017] pravastatin  80 mg Oral QHS  . rOPINIRole  1 mg Oral QHS  . sodium chloride flush  3 mL Intravenous Q12H  . [START ON 10/22/2017] vitamin B-12  1,000 mcg Oral Q1200   . sodium chloride      Allergies:  Allergies   Allergen Reactions  . Codeine Nausea Only  . Sulfonamide Derivatives Other (See Comments)    Reaction: unknown    Social History   Socioeconomic History  . Marital status: Married    Spouse name: Not on file  . Number of children: Not on file  . Years of education: Not on file  . Highest education level: Not on file  Social Needs  . Financial resource strain: Not on file  . Food insecurity - worry: Not on file  . Food insecurity - inability: Not on file  . Transportation needs - medical: Not on file  . Transportation needs - non-medical: Not on file  Occupational History  . Not on file  Tobacco Use  . Smoking status: Never Smoker  . Smokeless tobacco: Never Used  Substance and Sexual Activity  . Alcohol  use: No    Frequency: Never  . Drug use: Not on file  . Sexual activity: Not on file  Other Topics Concern  . Not on file  Social History Narrative  . Not on file     Family History  Problem Relation Age of Onset  . Breast cancer Mother 49     Review of Systems Positive for shortness of breath cough congestion Negative for: General:  chills, fever, night sweats or weight changes.  Cardiovascular: PND orthopnea syncope dizziness  Dermatological skin lesions rashes Respiratory: Positive for cough congestion Urologic: Frequent urination urination at night and hematuria Abdominal: negative for nausea, vomiting, diarrhea, bright red blood per rectum, melena, or hematemesis Neurologic: negative for visual changes, and/or hearing changes  All other systems reviewed and are otherwise negative except as noted above.  Labs: Recent Labs    10/21/17 1131  TROPONINI <0.03   Lab Results  Component Value Date   WBC 6.9 10/21/2017   HGB 10.3 (L) 10/21/2017   HCT 33.6 (L) 10/21/2017   MCV 99.7 10/21/2017   PLT 124 (L) 10/21/2017    Recent Labs  Lab 10/21/17 1131  NA 139  K 3.7  CL 106  CO2 27  BUN 21*  CREATININE 0.92  CALCIUM 8.2*  GLUCOSE 115*   No  results found for: CHOL, HDL, LDLCALC, TRIG No results found for: DDIMER  Radiology/Studies:  Dg Chest 2 View  Result Date: 10/21/2017 CLINICAL DATA:  83 year old female with difficulty breathing. Initial encounter. EXAM: CHEST  2 VIEW COMPARISON:  07/23/2016. FINDINGS: Diffuse coarsened airspace disease may represent pulmonary edema superimposed upon chronic lung changes. Difficult to exclude superimposed infection in the proper clinical setting. Cardiomegaly post aortic valve replacement. Calcified aorta. Thoracic kyphosis without focal compression fracture. Post left shoulder replacement. IMPRESSION: Diffuse coarsened airspace disease may represent pulmonary edema superimposed upon chronic lung changes. Difficult to exclude superimposed infection in the proper clinical setting (right perihilar region). Cardiomegaly post aortic valve replacement. Aortic Atherosclerosis (ICD10-I70.0). Electronically Signed   By: Genia Del M.D.   On: 10/21/2017 12:04    EKG: Normal sinus rhythm with first-degree AV block and right bundle branch block  Weights: Filed Weights   10/21/17 1129 10/21/17 1556  Weight: 80.8 kg (178 lb 1.6 oz) 77.5 kg (170 lb 14.4 oz)     Physical Exam: Blood pressure (!) 158/63, pulse 69, temperature 98.2 F (36.8 C), temperature source Oral, resp. rate (!) 22, height 5' (1.524 m), weight 77.5 kg (170 lb 14.4 oz), SpO2 93 %. Body mass index is 33.38 kg/m. General: Well developed, well nourished, in no acute distress. Head eyes ears nose throat: Normocephalic, atraumatic, sclera non-icteric, no xanthomas, nares are without discharge. No apparent thyromegaly and/or mass  Lungs: Normal respiratory effort.  no wheezes, few basilar rales, no rhonchi.  Diffuse crackles Heart: RRR with normal S1 S2.  2+ right upper sternal border murmur gallop, no rub, PMI is normal size and placement, carotid upstroke normal without bruit, jugular venous pressure is normal Abdomen: Soft,  non-tender, non-distended with normoactive bowel sounds. No hepatomegaly. No rebound/guarding. No obvious abdominal masses. Abdominal aorta is normal size without bruit Extremities: Trace edema. no cyanosis, no clubbing, no ulcers  Peripheral : 2+ bilateral upper extremity pulses, 2+ bilateral femoral pulses, 2+ bilateral dorsal pedal pulse Neuro: Alert and oriented. No facial asymmetry. No focal deficit. Moves all extremities spontaneously. Musculoskeletal: Normal muscle tone without kyphosis Psych:  Responds to questions appropriately with a normal affect.  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  Continue intravenous Lasix for acute on chronic diastolic dysfunction heart failure and change over to oral when able 2.  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  Toprol for heart rate control and amlodipine with clonidine for blood pressure control 4.  Follow for improvements of symptoms with adjustments of medications as next  Signed, Corey Skains M.D. [facility] 10/21/2017, 5:42 PM

## 2017-10-21 NOTE — ED Notes (Signed)
Pt dropped to 90% RA. Placed on 2 L nasal cannula. Will continue to monitor. Son at bedside.

## 2017-10-22 ENCOUNTER — Inpatient Hospital Stay
Admit: 2017-10-22 | Discharge: 2017-10-22 | Disposition: A | Discharge status: Home / Self Care | Payer: Medicare HMO | Attending: Internal Medicine | Admitting: Internal Medicine

## 2017-10-22 LAB — CBC
HEMATOCRIT: 37.3 % (ref 35.0–47.0)
HEMOGLOBIN: 11.8 g/dL — AB (ref 12.0–16.0)
MCH: 31.4 pg (ref 26.0–34.0)
MCHC: 31.7 g/dL — AB (ref 32.0–36.0)
MCV: 99.1 fL (ref 80.0–100.0)
Platelets: 79 10*3/uL — ABNORMAL LOW (ref 150–440)
RBC: 3.76 MIL/uL — AB (ref 3.80–5.20)
RDW: 17.9 % — ABNORMAL HIGH (ref 11.5–14.5)
WBC: 4.3 10*3/uL (ref 3.6–11.0)

## 2017-10-22 LAB — BASIC METABOLIC PANEL
Anion gap: 7 (ref 5–15)
BUN: 30 mg/dL — ABNORMAL HIGH (ref 6–20)
CALCIUM: 8.7 mg/dL — AB (ref 8.9–10.3)
CO2: 29 mmol/L (ref 22–32)
CREATININE: 1.02 mg/dL — AB (ref 0.44–1.00)
Chloride: 99 mmol/L — ABNORMAL LOW (ref 101–111)
GFR, EST AFRICAN AMERICAN: 56 mL/min — AB (ref >60)
GFR, EST NON AFRICAN AMERICAN: 48 mL/min — AB (ref >60)
GLUCOSE: 246 mg/dL — AB (ref 65–99)
Potassium: 4 mmol/L (ref 3.5–5.1)
SODIUM: 135 mmol/L (ref 135–145)

## 2017-10-22 LAB — ECHOCARDIOGRAM COMPLETE
Height: 60 in
WEIGHTICAEL: 2712 [oz_av]

## 2017-10-22 LAB — GLUCOSE, CAPILLARY
Glucose-Capillary: 280 mg/dL — ABNORMAL HIGH (ref 65–99)
Glucose-Capillary: 198 mg/dL — ABNORMAL HIGH (ref 65–99)
Glucose-Capillary: 135 mg/dL — ABNORMAL HIGH (ref 65–99)
Glucose-Capillary: 94 mg/dL (ref 65–99)

## 2017-10-22 LAB — TROPONIN I: Troponin I: <0.03 ng/mL (ref <0.03)

## 2017-10-22 MED ORDER — INSULIN ASPART 100 UNIT/ML ~~LOC~~ SOLN
5.0000 [IU] | Freq: Three times a day (TID) | SUBCUTANEOUS | Status: DC
  Administered 2017-10-22: 5 [IU] via SUBCUTANEOUS
  Filled 2017-10-22: qty 1

## 2017-10-22 MED ORDER — DIPHENHYDRAMINE HCL 25 MG PO CAPS
25.0000 mg | ORAL_CAPSULE | Freq: Every evening | ORAL | Status: DC | PRN
  Administered 2017-10-22: 25 mg via ORAL
  Filled 2017-10-22: qty 1

## 2017-10-22 MED ORDER — CLONIDINE HCL 0.1 MG PO TABS
0.1000 mg | ORAL_TABLET | Freq: Three times a day (TID) | ORAL | Status: DC
  Administered 2017-10-22 – 2017-10-23 (×3): 0.1 mg via ORAL
  Filled 2017-10-22 (×3): qty 1

## 2017-10-22 NOTE — Progress Notes (Signed)
Patient requested medication for sleep. Received orders for benadryl for Dr.Salary.

## 2017-10-22 NOTE — Progress Notes (Signed)
Patient ambulated on RA this AM. Before ambulation patient was 95% on RA. During ambulation patient's oxygen saturation dropped to 78%. Back at rest after ambulation patient's oxygen saturation = 93%. Will continue to monitor oxygenation status. [name] [name] [facility]

## 2017-10-22 NOTE — Progress Notes (Signed)
CHF Education:?? Educational session with patient and son completed.   ? Provided patient with "Living Better with Heart Failure" packet. Briefly reviewed definition of heart failure and signs and symptoms of an exacerbation.?Explained to patient that HF is a chronic illness which requires self-assessment / self-management along with help from the cardiologist/PCP/Heart Failure Clinic. Patient informed this RN that HF is a new diagnosis for her.   ? *Reviewed importance of and reason behind checking weight daily in the AM, after using the bathroom, but before getting dressed.  Patient has scales.    Reviewed the following information with patient:  *Discussed when to call the Dr= weight gain of >2-3lb overnight of 5lb in a week,  *Discussed yellow zone= call MD: weight gain of >2-3lb overnight of 5lb in a week, increased swelling, increased SOB when lying down, chest discomfort, dizziness, increased fatigue *Red Zone= call 911: struggle to breath, fainting or near fainting, significant chest pain  ? *Reviewed low sodium diet-provided handout of recommended and not recommended foods.Reviewed reading labels with patient. Discussed fluid intake with patient as well. Patient not currently on a fluid restriction, but advised no more than 8-8 ounces glass of fluids per day. ? *Instructed patient to take medications as prescribed for heart failure. Explained briefly why pt is on the medications (either make you feel better, live longer or keep you out of the hospital) and discussed monitoring and side effects.  ? *Discussed exercise.  Encouraged patient to be as active as she possibly can be.   *Smoking Cessation?- Patient is a never smoker.?  *[facility] - Informed patient of the role of the Heart Failure Clinic.  Explained to patient and son that the [facility] does not replace any of her doctors or specialists, but is simply an additional resource for her to help her  manage her heart failure and to keep her out of the hospital.  Patient was reluctant to agree to be followed in [facility], as patient wanted to wait and see if the HF would return.  Patient's son Gwyndolyn Saxon [name]) talked his mother into agreeing to be seen in HF Clinic.    Once again reviewed the 5 Steps to Living Better with Heart Failure. ?Patient and son thanked me for providing the above information. ? ? Roanna Epley, RN, BSN, [place]  [facility] Cardiac & Pulmonary Rehab  Cardiovascular & Pulmonary Nurse Navigator  Direct Line: [phone]  Department Phone #: [phone] Fax: [phone]  Email Address: [email or web address]  ?   ?

## 2017-10-22 NOTE — Progress Notes (Signed)
[facility] Cardiology [facility] Encounter Note  Patient: [name] [name] / Admit Date: 10/21/2017 / Date of Encounter: 10/22/2017, 5:53 AM   Subjective: Patient is breathing much better today.  No evidence of chest pain.  Troponin normal without evidence of myocardial infarction.  Review of Systems: Positive for: Shortness of breath cough congestion Negative for: Vision change, hearing change, syncope, dizziness, nausea, vomiting,diarrhea, bloody stool, stomach pain, positive for cough, congestion, negative for 2diaphoresis, urinary frequency, urinary pain,skin lesions, skin rashes Others previously listed  Objective: Telemetry: Normal sinus ectopy than before Physical Exam: Blood pressure (!) 157/65, pulse 73, temperature 97.9 F (36.6 C), temperature source Oral, resp. rate 18, height 5' (1.524 m), weight 77.5 kg (170 lb 14.4 oz), SpO2 92 %. Body mass index is 33.38 kg/m. General: Well developed, well nourished, in no acute distress. Head: Normocephalic, atraumatic, sclera non-icteric, no xanthomas, nares are without discharge. Neck: No apparent masses Lungs: Normal respirations with no wheezes, no rhonchi, no rales , diffuse crackles   Heart: Regular rate and rhythm, normal S1 S2, no murmur, no rub, no gallop, PMI is normal size and placement, carotid upstroke normal without bruit, jugular venous pressure normal Abdomen: Soft, non-tender, non-distended with normoactive bowel sounds. No hepatosplenomegaly. Abdominal aorta is normal size without bruit Extremities: Trace edema, no clubbing, no cyanosis, no ulcers,  Peripheral: 2+ radial, 2+ femoral, 2+ dorsal pedal pulses Neuro: Alert and oriented. Moves all extremities spontaneously. Psych:  Responds to questions appropriately with a normal affect.   Intake/Output Summary (Last 24 hours) at 10/22/2017 0553 Last data filed at 10/22/2017 0132 Gross per 24 hour  Intake 243 ml  Output 950 ml  Net -707 ml    Inpatient  Medications:  . allopurinol  300 mg Oral Daily  . amLODipine  5 mg Oral Daily  . aspirin EC  81 mg Oral Daily  . cholecalciferol  1,000 Units Oral Q1200  . cloNIDine  0.1 mg Oral q morning - 10a  . cloNIDine  0.2 mg Oral QHS  . docusate sodium  100 mg Oral QHS  . enoxaparin (LOVENOX) injection  40 mg Subcutaneous Q24H  . furosemide  20 mg Intravenous Daily  . gabapentin  100 mg Oral TID  . insulin aspart  0-9 Units Subcutaneous TID WC  . insulin glargine  30 Units Subcutaneous QHS  . levothyroxine  100 mcg Oral QAC breakfast  . metoprolol succinate  100 mg Oral BID  . multivitamin with minerals  1 tablet Oral Q1200  . omega-3 acid ethyl esters  1 g Oral Q1200  . pantoprazole  40 mg Oral Daily  . pravastatin  80 mg Oral QHS  . rOPINIRole  1 mg Oral QHS  . sodium chloride flush  3 mL Intravenous Q12H  . sodium chloride flush  3 mL Intravenous Q12H  . vitamin B-12  1,000 mcg Oral Q1200   Infusions:  . sodium chloride      Labs: Recent Labs    10/21/17 1131 10/22/17 0328  NA 139 135  K 3.7 4.0  CL 106 99*  CO2 27 29  GLUCOSE 115* 246*  BUN 21* 30*  CREATININE 0.92 1.02*  CALCIUM 8.2* 8.7*   No results for input(s): AST, ALT, ALKPHOS, BILITOT, PROT, ALBUMIN in the last 72 hours. Recent Labs    10/21/17 1131 10/22/17 0328  WBC 6.9 4.3  HGB 10.3* 11.8*  HCT 33.6* 37.3  MCV 99.7 99.1  PLT 124* 79*   Recent Labs    10/21/17  1131 10/21/17 1612 10/21/17 2103 10/22/17 0328  TROPONINI <0.03 0.03* <0.03 <0.03   Invalid input(s): POCBNP No results for input(s): HGBA1C in the last 72 hours.   Weights: Filed Weights   10/21/17 1129 10/21/17 1556  Weight: 80.8 kg (178 lb 1.6 oz) 77.5 kg (170 lb 14.4 oz)     Radiology/Studies:  Dg Chest 2 View  Result Date: 10/21/2017 CLINICAL DATA:  83 year old female with difficulty breathing. Initial encounter. EXAM: CHEST  2 VIEW COMPARISON:  07/23/2016. FINDINGS: Diffuse coarsened airspace disease may represent pulmonary  edema superimposed upon chronic lung changes. Difficult to exclude superimposed infection in the proper clinical setting. Cardiomegaly post aortic valve replacement. Calcified aorta. Thoracic kyphosis without focal compression fracture. Post left shoulder replacement. IMPRESSION: Diffuse coarsened airspace disease may represent pulmonary edema superimposed upon chronic lung changes. Difficult to exclude superimposed infection in the proper clinical setting (right perihilar region). Cardiomegaly post aortic valve replacement. Aortic Atherosclerosis (ICD10-I70.0). Electronically Signed   By: Genia Del M.D.   On: 10/21/2017 12:04     Assessment and Recommendation  83 y.o. 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  Continue supportive care and treatment of bronchitis and hypoxia 2.  Continue metoprolol hydralazine clonidine and amlodipine for hypertension control and heart rate control for congestive heart failure 3.  Lasix possibly intravenous today with changing over to oral Lasix thereafter 4.  No further cardiac diagnostics necessary at this time 5.  Begin ambulation and follow for improvements of symptoms and possible discharged home with follow-up next week for adjustments of medication management  Signed, Serafina Royals M.D. FACC

## 2017-10-22 NOTE — Care Management (Signed)
Discussed having home oxygen assessment performed on patient and documented.  There is documentation that patient dropped room air sats to 78 but there is not documentation of recovery when oxygen applied.  Spoke with primary nurse regarding use of the amb sat [employer] text. Discussed possible need for home oxygen with patient  and she is very firm in her statement that she will not discharge with oxygen. Says "it has always recovered and it will again".  She will not agree to allowing CM give a heads up referral for home health nurse follow up for heart failure.  Patient's son lives with her and is at bedside. He confirms that there most likely will not be a need for home health follow up.  Has access to scales.

## 2017-10-22 NOTE — Evaluation (Signed)
Physical Therapy Evaluation Patient Details Name: [name] [name] MRN: [id number] DOB: 09/12/1929 Today's Date: 10/22/2017   History of Present Illness  Pt is a 83 y/o F who presented with SOB, LE edema.  Chest x-ray shows CHF. Pt's PMH includes CHF.  Clinical Impression  Pt admitted with above diagnosis. Ms. Moger is independent with all aspects of mobility.  No skilled PT needs identified, PT will sign off.  However, recommend OT Evaluation for energy conservation techniques and recommend Cardiopulmonary Rehab as pt's main limitation is her pulmonary status.  SpO2 86% on RA upon arrival with pt sitting in chair, 2L applied.  Standing rest break once SpO2 dropped to 83% with ambulation and further dropping as low as 79% even after static standing and pursed lip breathing.  Increased to 3L with SpO2 recovering to the mid 90s and staying in the mid 90s for remainder of session.  RN notified and requested that pt stay on 2L after session with pt's SpO2 99%.     Follow Up Recommendations No PT follow up    Equipment Recommendations  None recommended by PT    Recommendations for Other Services Other (comment);OT consult(OT for energy conservation; Cardiopulmonary Rehab)     Precautions / Restrictions Precautions Precautions: Other (comment) Precaution Comments: O2 Restrictions Weight Bearing Restrictions: No      Mobility  Bed Mobility               General bed mobility comments: Pt sitting in chair at start and end of session  Transfers Overall transfer level: Independent Equipment used: None             General transfer comment: No signs of instability.  No physical assist or cues needed.  Ambulation/Gait Ambulation/Gait assistance: Independent Ambulation Distance (Feet): 50 Feet Assistive device: None Gait Pattern/deviations: WFL(Within Functional Limits)   Gait velocity interpretation: at or above normal speed for age/gender General Gait Details: Pt steady  and no signs of instability.  SpO2 86% on RA upon arrival with pt sitting in chair, 2L applied.  Standing rest break once SpO2 dropped to 83% and further dropping as low as 79% even after static standing and pursed lip breathing.  Increased to 3L with SpO2 recovering to the mid 90s and staying in the mid 90s for remainder of session.  RN notified and requested that pt stay on 2L after session with pt's SpO2 99%.   Stairs            Wheelchair Mobility    Modified Rankin (Stroke Patients Only)       Balance Overall balance assessment: Independent                                           Pertinent Vitals/Pain Pain Assessment: No/denies pain    Home Living Family/patient expects to be discharged to:: Private residence Living Arrangements: Children Available Help at Discharge: Family;Available 24 hours/day Type of Home: House Home Access: Stairs to enter Entrance Stairs-Rails: Left;Right;Can reach both Entrance Stairs-Number of Steps: 2 Home Layout: One level Home Equipment: Cane - single point;Grab bars - tub/shower      Prior Function Level of Independence: Independent         Comments: Son drives as pt has impaired vision but otherwise pt independent with all mobility, ADLs, IADLs.      Hand Dominance  Extremity/Trunk Assessment   Upper Extremity Assessment Upper Extremity Assessment: Overall WFL for tasks assessed    Lower Extremity Assessment Lower Extremity Assessment: Overall WFL for tasks assessed       Communication   Communication: No difficulties  Cognition Arousal/Alertness: Awake/alert Behavior During Therapy: WFL for tasks assessed/performed Overall Cognitive Status: Within Functional Limits for tasks assessed                                        General Comments      Exercises Other Exercises Other Exercises: Encouraged pt to practice pursed lip breathing throughout the day.   Other  Exercises: Instructed pt in setting up chair around her home so she can take seated rest breaks when she becomes SOB.    Assessment/Plan    PT Assessment Patent does not need any further PT services  PT Problem List         PT Treatment Interventions      PT Goals (Current goals can be found in the Care Plan section)  Acute Rehab PT Goals Patient Stated Goal: to go home PT Goal Formulation: All assessment and education complete, DC therapy    Frequency     Barriers to discharge        Co-evaluation               AM-PAC PT "6 Clicks" Daily Activity  Outcome Measure Difficulty turning over in bed (including adjusting bedclothes, sheets and blankets)?: None Difficulty moving from lying on back to sitting on the side of the bed? : None Difficulty sitting down on and standing up from a chair with arms (e.g., wheelchair, bedside commode, etc,.)?: None Help needed moving to and from a bed to chair (including a wheelchair)?: None Help needed walking in hospital room?: None Help needed climbing 3-5 steps with a railing? : None 6 Click Score: 24    End of Session Equipment Utilized During Treatment: Gait belt;Oxygen Activity Tolerance: Treatment limited secondary to medical complications (Comment);Patient tolerated treatment well(hypoxia) Patient left: in chair;with call bell/phone within reach Nurse Communication: Mobility status;Other (comment)(SpO2) PT Visit Diagnosis: Unsteadiness on feet (R26.81)    Time: 6761-9509 PT Time Calculation (min) (ACUTE ONLY): 13 min   Charges:   PT Evaluation $PT Eval Low Complexity: 1 Low     PT G Codes:   PT G-Codes **NOT FOR INPATIENT CLASS** Functional Assessment Tool Used: AM-PAC 6 Clicks Basic Mobility;Clinical judgement Functional Limitation: Mobility: Walking and moving around Mobility: Walking and Moving Around Current Status (T2671): 0 percent impaired, limited or restricted Mobility: Walking and Moving Around Goal Status  (I4580): 0 percent impaired, limited or restricted Mobility: Walking and Moving Around Discharge Status (D9833): 0 percent impaired, limited or restricted    Collie Siad PT, DPT 10/22/2017, 4:40 PM

## 2017-10-22 NOTE — Progress Notes (Addendum)
[facility] [place] NAME: [name] [name]    MR#:  [id number]  DATE OF BIRTH:  1929/07/27  SUBJECTIVE:  CHIEF COMPLAINT:   Chief Complaint  Patient presents with  . Shortness of Breath   The patient feels much better.  Off oxygen. Before ambulation patient was 95% on RA. During ambulation patient's oxygen saturation dropped to 78%. Back at rest after ambulation patient's oxygen saturation = 93%. REVIEW OF SYSTEMS:  Review of Systems  Constitutional: Negative for chills, fever and malaise/fatigue.  HENT: Negative for sore throat.   Eyes: Negative for blurred vision and double vision.  Respiratory: Positive for cough and shortness of breath. Negative for hemoptysis, wheezing and stridor.   Cardiovascular: Negative for chest pain, palpitations, orthopnea and leg swelling.  Gastrointestinal: Negative for abdominal pain, blood in stool, diarrhea, melena, nausea and vomiting.  Genitourinary: Negative for dysuria, flank pain and hematuria.  Musculoskeletal: Negative for back pain and joint pain.  Skin: Negative for rash.  Neurological: Negative for dizziness, sensory change, focal weakness, seizures, loss of consciousness, weakness and headaches.  Endo/Heme/Allergies: Negative for polydipsia.  Psychiatric/Behavioral: Negative for depression. The patient is not nervous/anxious.     DRUG ALLERGIES:   Allergies  Allergen Reactions  . Codeine Nausea Only  . Sulfonamide Derivatives Other (See Comments)    Reaction: unknown   VITALS:  Blood pressure (!) 171/67, pulse 69, temperature 97.9 F (36.6 C), resp. rate 16, height 5' (1.524 m), weight 169 lb 8 oz (76.9 kg), SpO2 95 %. PHYSICAL EXAMINATION:  Physical Exam  Constitutional: She is oriented to person, place, and time and well-developed, well-nourished, and in no distress.  HENT:  Head: Normocephalic.  Mouth/Throat: Oropharynx is clear and moist.  Eyes: Conjunctivae and EOM are normal.  Pupils are equal, round, and reactive to light. No scleral icterus.  Neck: Normal range of motion. Neck supple. No JVD present. No tracheal deviation present.  Cardiovascular: Normal rate, regular rhythm and normal heart sounds. Exam reveals no gallop.  No murmur heard. Pulmonary/Chest: Effort normal. No respiratory distress. She has no wheezes. She has rales.  Abdominal: Soft. Bowel sounds are normal. She exhibits no distension. There is no tenderness. There is no rebound.  Musculoskeletal: Normal range of motion. She exhibits no edema or tenderness.  Neurological: She is alert and oriented to person, place, and time. No cranial nerve deficit.  Skin: No rash noted. No erythema.  Psychiatric: Affect normal.   LABORATORY PANEL:  Female CBC Recent Labs  Lab 10/22/17 0328  WBC 4.3  HGB 11.8*  HCT 37.3  PLT 79*   ------------------------------------------------------------------------------------------------------------------ Chemistries  Recent Labs  Lab 10/22/17 0328  NA 135  K 4.0  CL 99*  CO2 29  GLUCOSE 246*  BUN 30*  CREATININE 1.02*  CALCIUM 8.7*   RADIOLOGY:  No results found. ASSESSMENT AND PLAN:   83 year old female with history of diabetes and chronic diastolic heart failure with preserved ejection fraction who presents with shortness of breath.  1. Acute hypoxic respiratory failure in the setting of acute on chronic diastolic heart failure Weaned oxygen. NEB prn.  2. Acute on chronic diastolic heart failure with preserved ejection fraction: Continue IV Lasix Monitor intake and output with daily weight, follow-up echocardiogram. Current treatment per Dr. Nehemiah Massed.  3. Accelerated Essential hypertension Restarted outpatient medications including Norvasc, clonidine, metoprolol Hydralazine when necessary  4. Diabetes: Continue ADA diet with sliding scale and Lantus Hold glipizide while in the hospital.  NovoLog  before meals meals  5. Hypothyroidism:  Continue Synthroid  6. Restless leg syndrome: Continue Requip  7. Hyperlipidemia: Continue Pravachol  Dehydration.  Follow-up BMP while on Lasix.  All the records are reviewed and case discussed with Care Management/Social Worker. Management plans discussed with the patient, her son and they are in agreement.  CODE STATUS: DNR  TOTAL TIME TAKING CARE OF THIS PATIENT: 35 minutes.   More than 50% of the time was spent in counseling/coordination of care: YES  POSSIBLE D/C IN 1-2 DAYS, DEPENDING ON CLINICAL CONDITION.   Demetrios Loll M.D on 10/22/2017 at 2:07 PM  Between 7am to 6pm - Pager - [phone]  After 6pm go to [email or web address] - [facility]

## 2017-10-22 NOTE — Progress Notes (Signed)
PT Cancellation Note  Patient Details Name: [name] [name] MRN: [id number] DOB: 1929/08/16   Cancelled Treatment:    Reason Eval/Treat Not Completed: Other (comment).  Cardiopulmonary representative beginning conversation with pt upon PT arrival.  Will continue to follow and will attempt to see again next date (12/28).    Collie Siad PT, DPT 10/22/2017, 4:06 PM

## 2017-10-22 NOTE — Progress Notes (Signed)
Inpatient Diabetes Program Recommendations  AACE/ADA: New Consensus Statement on Inpatient Glycemic Control (2015)  Target Ranges:  Prepandial:   less than 140 mg/dL      Peak postprandial:   less than 180 mg/dL (1-2 hours)      Critically ill patients:  140 - 180 mg/dL   Lab Results  Component Value Date   GLUCAP 280 (H) 10/22/2017   HGBA1C 8.5 (H) 07/20/2016   A1C 7.4% on 04/20/17 in care everywhere  Review of Glycemic Control  Results for [name] [name], [name] [name] (MRN [id number]) as of 10/22/2017 11:17  Ref. Range 10/21/2017 16:45 10/21/2017 20:16 10/22/2017 07:38  Glucose-Capillary Latest Ref Range: 65 - 99 mg/dL 220 (H) 303 (H) 280 (H)    Diabetes history: Type 2 Outpatient Diabetes medications: Levemir 30 units qhs, Glipizide 10mg  tid, Humalog 2units tid plus 1 unit for every 50glucometer points over 200 with after meal glucose  Current orders for Inpatient glycemic control: Lantus 30 units qhs, Novolog 0-9 units tid  Inpatient Diabetes Program Recommendations: Please consider adding Novolog 5 units tid with meals- continue Novolog correction and Lantus as ordered.   Gentry Fitz, RN, BA, MHA, CDE Diabetes Coordinator Inpatient Diabetes Program  [phone] (Team Pager) [phone] ([place]) 10/22/2017 11:23 AM

## 2017-10-22 NOTE — Progress Notes (Signed)
*  PRELIMINARY RESULTS* Echocardiogram 2D Echocardiogram has been performed.  Sherrie Sport 10/22/2017, 2:54 PM

## 2017-10-23 LAB — MAGNESIUM: MAGNESIUM: 1.8 mg/dL (ref 1.7–2.4)

## 2017-10-23 LAB — BASIC METABOLIC PANEL
Anion gap: 8 (ref 5–15)
BUN: 37 mg/dL — AB (ref 6–20)
CO2: 29 mmol/L (ref 22–32)
CREATININE: 0.96 mg/dL (ref 0.44–1.00)
Calcium: 8.9 mg/dL (ref 8.9–10.3)
Chloride: 99 mmol/L — ABNORMAL LOW (ref 101–111)
GFR, EST NON AFRICAN AMERICAN: 52 mL/min — AB (ref >60)
Glucose, Bld: 58 mg/dL — ABNORMAL LOW (ref 65–99)
Potassium: 4.1 mmol/L (ref 3.5–5.1)
SODIUM: 136 mmol/L (ref 135–145)

## 2017-10-23 LAB — GLUCOSE, CAPILLARY
GLUCOSE-CAPILLARY: 50 mg/dL — AB (ref 65–99)
Glucose-Capillary: 65 mg/dL (ref 65–99)
Glucose-Capillary: 87 mg/dL (ref 65–99)
Glucose-Capillary: 142 mg/dL — ABNORMAL HIGH (ref 65–99)

## 2017-10-23 MED ORDER — FUROSEMIDE 40 MG PO TABS: 40.0000 mg | ORAL_TABLET | Freq: Every day | ORAL | Status: DC

## 2017-10-23 NOTE — Care Management Note (Addendum)
Case Management Note  Patient Details  Name: [name] [name] MRN: [id number] Date of Birth: February 06, 1929  Subjective/Objective:     [name] refused offers of home health services. [name] delivered a portable oxygen tank to [name] today, and will coordinate with her son to set up new oxygen in her home which she shares with her son. [name] is resistant to having oxygen but her 02 saturations drop into the 70's upon exertion without oxygen. Son [name] has agreed to work with his Mother to try and conveince her of the necessity of supplemental oxygen at least temporarily.               Action/Plan:   Expected Discharge Date:  10/23/17               Expected Discharge Plan:     In-House Referral:     Discharge planning Services     Post Acute Care Choice:    Choice offered to:     DME Arranged:    DME Agency:     HH Arranged:    HH Agency:     Status of Service:     If discussed at [employer], dates discussed:    Additional Comments:  [name] A, RN 10/23/2017, 2:02 PM

## 2017-10-23 NOTE — Plan of Care (Signed)
Patient is currently in denial about the use of oxygen at home. Patient desats while off oxygen. Patient is having trouble comprehending the necessity of the oxygen. Please provide thoughtful education.

## 2017-10-23 NOTE — Evaluation (Signed)
Occupational Therapy Evaluation Patient Details Name: Manya Balash MRN: [id number] DOB: Jun 15, 1929 Today's Date: 10/23/2017    History of Present Illness Pt is a 83 y/o F who presented with SOB, LE edema.  Chest x-ray shows CHF. Pt's PMH includes CHF.   Clinical Impression   Patient seen this date for OT evaluation and treatment.  Patient presents with decreased activity tolerance and muscle weakness, O2 sats drop as patient engages in tasks.  Patient currently now on oxygen and will plan to have oxygen at home.  She does not like any adaptive equipment but was somewhat receptive to energy conservation techniques since she has already implemented a few of the strategies.  She admits to being "stubborn" and likes to do things in her own way and own time.  Educated on energy conservation packet, options for adaptive equipment and need for oxygen with tasks as well as symptoms to pay attention to if she removes oxygen at home when performing activities.  She reports she understands and her son is present and demos understanding and was able to see the monitoring as patient engaged in tasks this date and effects on her O2 levels. She will not likely require any follow up OT services at this time.     Follow Up Recommendations  No OT follow up    Equipment Recommendations       Recommendations for Other Services       Precautions / Restrictions Precautions Precautions: Other (comment) Precaution Comments: O2 Restrictions Weight Bearing Restrictions: No      Mobility Bed Mobility Overal bed mobility: Independent                Transfers Overall transfer level: Independent                    Balance Overall balance assessment: Independent                                         ADL either performed or assessed with clinical judgement   ADL Overall ADL's : At baseline                                       General ADL  Comments: Patient able to complete basic self care tasks, light homemaking skills however currrently her O2 sats drop if she is performing without oxygen.  She reports she does not like to use any equipment and does not really want oxygen.  Toileting this date, O2 sats with oxygen prior to task 98, removed oxygen and patient dropped to 85 by the time she reached the toilet and then was at 80 when returning to chair.  Encouraged pursed lip breathing in sitting however oxygen levels stayed around 80.  Put oxygen back on and returned in the 90s.  Son present during session and encouraging patient to use oxygen.       Vision Patient Visual Report: No change from baseline       Perception     Praxis      Pertinent Vitals/Pain Pain Assessment: No/denies pain     Hand Dominance Right   Extremity/Trunk Assessment Upper Extremity Assessment Upper Extremity Assessment: Generalized weakness   Lower Extremity Assessment Lower Extremity Assessment: Defer to PT evaluation       Communication  Communication Communication: No difficulties   Cognition Arousal/Alertness: Awake/alert Behavior During Therapy: WFL for tasks assessed/performed Overall Cognitive Status: Within Functional Limits for tasks assessed                                     General Comments   Patient seen for toileting and self care tasks in the bathroom, O2 monitoring throughout session.  Issued and instructed on energy conservation tech packet for home.     Exercises     Shoulder Instructions      Home Living Family/patient expects to be discharged to:: Private residence Living Arrangements: Children Available Help at Discharge: Family;Available 24 hours/day Type of Home: House Home Access: Stairs to enter [employer] of Steps: 2 Entrance Stairs-Rails: Left;Right;Can reach both Home Layout: One level     Bathroom Shower/Tub: Occupational psychologist: Handicapped  height Bathroom Accessibility: No   Home Equipment: Cane - single point          Prior Functioning/Environment Level of Independence: Independent        Comments: Son drives as pt has impaired vision but otherwise pt independent with all mobility, ADLs, IADLs.         OT Problem List: Decreased activity tolerance;Decreased strength;Decreased knowledge of use of DME or AE      OT Treatment/Interventions:      OT Goals(Current goals can be found in the care plan section) Acute Rehab OT Goals Patient Stated Goal: to go home OT Goal Formulation: With patient/family Time For Goal Achievement: 10/30/17 Potential to Achieve Goals: Good ADL Goals Additional ADL Goal #1: (P) Patient will demonstrate understanding of energy conservation techniques to perform basic self care tasks with modfied independence.  OT Frequency:     Barriers to D/C:            Co-evaluation              AM-PAC PT "6 Clicks" Daily Activity     Outcome Measure Help from another person eating meals?: None Help from another person taking care of personal grooming?: None Help from another person toileting, which includes using toliet, bedpan, or urinal?: None Help from another person bathing (including washing, rinsing, drying)?: None Help from another person to put on and taking off regular upper body clothing?: None Help from another person to put on and taking off regular lower body clothing?: None 6 Click Score: 24   End of Session    Activity Tolerance: Patient tolerated treatment well Patient left: in chair;with call bell/phone within reach;with family/visitor present  OT Visit Diagnosis: Muscle weakness (generalized) (M62.81)                Time: 4854-6270 OT Time Calculation (min): 23 min Charges:  OT General Charges $OT Visit: 1 Visit OT Evaluation $OT Eval Low Complexity: 1 Low OT Treatments $Self Care/Home Management : 8-22 mins G-Codes: OT G-codes **NOT FOR INPATIENT  CLASS** Functional Assessment Tool Used: AM-PAC 6 Clicks Daily Activity Functional Limitation: Self care Self Care Current Status (J5009): At least 1 percent but less than 20 percent impaired, limited or restricted Self Care Goal Status (F8182): At least 1 percent but less than 20 percent impaired, limited or restricted   Amy T Lovett, OTR/L, CLT   Lovett,Amy 10/23/2017, 11:36 AM

## 2017-10-23 NOTE — Progress Notes (Signed)
Pts CBG is 50. Juice given will recheck in 15 minutes

## 2017-10-23 NOTE — Discharge Summary (Signed)
[facility] [place] NAME: [name] [name]    MR#:  [id number]  DATE OF BIRTH:  12-28-1928  DATE OF ADMISSION:  10/21/2017 ADMITTING PHYSICIAN: Bettey Costa, MD  DATE OF DISCHARGE: 10/23/2017  PRIMARY CARE PHYSICIAN: Gayland Curry, MD    ADMISSION DIAGNOSIS:  Acute pulmonary edema ([place]) [J81.0] Hypoxia [R09.02] Hypertension, unspecified type [I10]  DISCHARGE DIAGNOSIS:  Acute on chronic Diastolic CHF Acute renal insufficiency --resolved SECONDARY DIAGNOSIS:   Past Medical History:  Diagnosis Date  . Diabetes mellitus without complication ([place])   . Hypertension   . Thyroid disease     HOSPITAL COURSE:  83 year old female with history of diabetes and chronic diastolic heart failure with preserved ejection fraction who presents with shortness of breath.  1. Acute hypoxic respiratory failure in the setting of acute on chronic diastolic heart failure Weaned oxygen. NEB prn. -pt will be set up for oxygen at home. She desats with ambulation. Pt is finally agreed to take oxygen home. Son is ok with it as well  2. Acute on chronic diastolic heart failure with preserved ejection fraction: -IV Lasix---good uop--change to oral lasix Monitor intake and output with daily weight Current treatment per Dr. Romilda Joy will f/u with Dr Ubaldo Glassing as out pt  3. Accelerated Essential hypertension Restarted outpatient medications including norvasc,clonidine,metoprolol Hydralazine when necessary  4. Diabetes: Continue ADA diet with sliding scale and Lantus Resume home meds  NovoLog before meals meals  5. Hypothyroidism: Continue Synthroid  6. Restless leg syndrome: Continue Requip  7. Hyperlipidemia: Continue Pravachol  PT has seen pt. No PT needs at home  D/c home after oxygen arranged. CM notified   CONSULTS OBTAINED:  Treatment Team:  Corey Skains, MD  DRUG ALLERGIES:   Allergies  Allergen Reactions  . Codeine  Nausea Only  . Sulfonamide Derivatives Other (See Comments)    Reaction: unknown    DISCHARGE MEDICATIONS:   Allergies as of 10/23/2017      Reactions   Codeine Nausea Only   Sulfonamide Derivatives Other (See Comments)   Reaction: unknown      Medication List    STOP taking these medications   azithromycin 500 MG tablet Commonly known as:  ZITHROMAX   benzonatate 200 MG capsule Commonly known as:  TESSALON   cefUROXime 500 MG tablet Commonly known as:  CEFTIN   predniSONE 10 MG (21) Tbpk tablet Commonly known as:  STERAPRED UNI-PAK 21 TAB     TAKE these medications   allopurinol 300 MG tablet Commonly known as:  ZYLOPRIM Take 300 mg by mouth daily.   amLODipine 5 MG tablet Commonly known as:  NORVASC Take 1 tablet (5 mg total) by mouth daily.   aspirin EC 81 MG tablet Take 81 mg by mouth daily.   cholecalciferol 1000 units tablet Commonly known as:  VITAMIN D Take 1,000 Units by mouth daily at 12 noon.   cloNIDine 0.1 MG tablet Commonly known as:  CATAPRES Take 0.1 mg by mouth 3 (three) times daily.   furosemide 40 MG tablet Commonly known as:  LASIX Take 1 tablet (40 mg total) by mouth daily.   gabapentin 100 MG capsule Commonly known as:  NEURONTIN Take 100 mg by mouth 3 (three) times daily.   glipiZIDE 10 MG tablet Commonly known as:  GLUCOTROL Take 10 mg by mouth 3 (three) times daily.   insulin detemir 100 UNIT/ML injection Commonly known as:  LEVEMIR Inject 30 Units into the skin at bedtime.  insulin lispro 100 UNIT/ML injection Commonly known as:  HUMALOG Inject 2 Units into the skin 3 (three) times daily before meals. Use 2 units for every 50 glucometer points over 200 with your after meal glucose checks   levothyroxine 100 MCG tablet Commonly known as:  SYNTHROID, LEVOTHROID Take 100 mcg by mouth daily.   metoprolol succinate 100 MG 24 hr tablet Commonly known as:  TOPROL-XL Take 1 tablet (100 mg total) by mouth 2 (two) times  daily.   multivitamin with minerals Tabs tablet Take 1 tablet by mouth daily at 12 noon.   omega-3 acid ethyl esters 1 g capsule Commonly known as:  LOVAZA Take 1 g by mouth daily at 12 noon.   omeprazole 20 MG capsule Commonly known as:  PRILOSEC Take 20 mg by mouth at bedtime.   pravastatin 80 MG tablet Commonly known as:  PRAVACHOL Take 80 mg by mouth at bedtime.   rOPINIRole 0.5 MG tablet Commonly known as:  REQUIP Take 1 mg by mouth at bedtime.   triamcinolone cream 0.1 % Commonly known as:  KENALOG Apply 1 application topically 2 (two) times daily.   vitamin B-12 1000 MCG tablet Commonly known as:  CYANOCOBALAMIN Take 1,000 mcg by mouth daily at 12 noon.            Durable Medical Equipment  (From admission, onward)        Start     Ordered   10/23/17 1001  For home use only DME oxygen  Once    Question Answer Comment  Mode or (Route) Nasal cannula   Liters per Minute 2   Frequency Continuous (stationary and portable oxygen unit needed)   Oxygen conserving device Yes   Oxygen delivery system Gas      10/23/17 1000      If you experience worsening of your admission symptoms, develop shortness of breath, life threatening emergency, suicidal or homicidal thoughts you must seek medical attention immediately by calling 911 or calling your MD immediately  if symptoms less severe.  You Must read complete instructions/literature along with all the possible adverse reactions/side effects for all the Medicines you take and that have been prescribed to you. Take any new Medicines after you have completely understood and accept all the possible adverse reactions/side effects.   Please note  You were cared for by a hospitalist during your hospital stay. If you have any questions about your discharge medications or the care you received while you were in the hospital after you are discharged, you can call the unit and asked to speak with the hospitalist on call if  the hospitalist that took care of you is not available. Once you are discharged, your primary care physician will handle any further medical issues. Please note that NO REFILLS for any discharge medications will be authorized once you are discharged, as it is imperative that you return to your primary care physician (or establish a relationship with a primary care physician if you do not have one) for your aftercare needs so that they can reassess your need for medications and monitor your lab values. Today   SUBJECTIVE   Feels better. Tired ot peeing all nite Good uop  VITAL SIGNS:  Blood pressure (!) 145/45, pulse 66, temperature 97.9 F (36.6 C), resp. rate 18, height 5' (1.524 m), weight 77.1 kg (169 lb 14.4 oz), SpO2 100 %.  I/O:    Intake/Output Summary (Last 24 hours) at 10/23/2017 1025 Last data filed at 10/23/2017  0542 Gross per 24 hour  Intake 240 ml  Output 3050 ml  Net -2810 ml    PHYSICAL EXAMINATION:  GENERAL:  83 y.o.-year-old patient lying in the bed with no acute distress.  EYES: Pupils equal, round, reactive to light and accommodation. No scleral icterus. Extraocular muscles intact.  HEENT: Head atraumatic, normocephalic. Oropharynx and nasopharynx clear.  NECK:  Supple, no jugular venous distention. No thyroid enlargement, no tenderness.  LUNGS: Normal breath sounds bilaterally, no wheezing, few rales,no rhonchi. No use of accessory muscles of respiration.  CARDIOVASCULAR: S1, S2 normal. No murmurs, rubs, or gallops.  ABDOMEN: Soft, non-tender, non-distended. Bowel sounds present. No organomegaly or mass.  EXTREMITIES: No pedal edema, cyanosis, or clubbing.  NEUROLOGIC: Cranial nerves II through XII are intact. Muscle strength 5/5 in all extremities. Sensation intact. Gait not checked.  PSYCHIATRIC: The patient is alert and oriented x 3.  SKIN: No obvious rash, lesion, or ulcer.   DATA REVIEW:   CBC  Recent Labs  Lab 10/22/17 0328  WBC 4.3  HGB 11.8*   HCT 37.3  PLT 79*    Chemistries  Recent Labs  Lab 10/23/17 0732  NA 136  K 4.1  CL 99*  CO2 29  GLUCOSE 58*  BUN 37*  CREATININE 0.96  CALCIUM 8.9  MG 1.8    Microbiology Results   No results found for this or any previous visit (from the past 240 hour(s)).  RADIOLOGY:  Dg Chest 2 View  Result Date: 10/21/2017 CLINICAL DATA:  83 year old female with difficulty breathing. Initial encounter. EXAM: CHEST  2 VIEW COMPARISON:  07/23/2016. FINDINGS: Diffuse coarsened airspace disease may represent pulmonary edema superimposed upon chronic lung changes. Difficult to exclude superimposed infection in the proper clinical setting. Cardiomegaly post aortic valve replacement. Calcified aorta. Thoracic kyphosis without focal compression fracture. Post left shoulder replacement. IMPRESSION: Diffuse coarsened airspace disease may represent pulmonary edema superimposed upon chronic lung changes. Difficult to exclude superimposed infection in the proper clinical setting (right perihilar region). Cardiomegaly post aortic valve replacement. Aortic Atherosclerosis (ICD10-I70.0). Electronically Signed   By: Genia Del M.D.   On: 10/21/2017 12:04     Management plans discussed with the patient, family and they are in agreement.  CODE STATUS:     Code Status Orders  (From admission, onward)        Start     Ordered   10/21/17 1437  Do not attempt resuscitation (DNR)  Continuous    Question Answer Comment  In the event of cardiac or respiratory ARREST Do not call a "code blue"   In the event of cardiac or respiratory ARREST Do not perform Intubation, CPR, defibrillation or ACLS   In the event of cardiac or respiratory ARREST Use medication by any route, position, wound care, and other measures to relive pain and suffering. May use oxygen, suction and manual treatment of airway obstruction as needed for comfort.      10/21/17 1437    Code Status History    Date Active Date Inactive  Code Status Order ID Comments User Context   07/20/2016 03:06 07/20/2016 12:39 Full Code [id number]  Lance Coon, MD Inpatient      TOTAL TIME TAKING CARE OF THIS PATIENT: *40* minutes.    Fritzi Mandes M.D on 10/23/2017 at 10:25 AM  Between 7am to 6pm - Pager - [phone] After 6pm go to [email or web address] - password [email or web address] [facility]  Office  [phone]  CC: Primary care physician; Gayland Curry, MD

## 2017-10-23 NOTE — Progress Notes (Signed)
Hypoglycemic Event  CBG: 50  Treatment: 4 oz juice  Symptoms: asymptomatic  Follow-up CBG: [id number] CBG Result:65  Possible Reasons for Event: n/a  Comments/MD notified:recheck shows 65, 4 more oz of juice given recheck at 0907 shows 87.     Margarita Mail

## 2017-10-23 NOTE — Care Management Note (Signed)
Case Management Note  Patient Details  Name: [name] [name] MRN: [id number] Date of Birth: 1929-06-17  Subjective/Objective:       Room Air at rest=86%                                               Room air with exertion=79%                                               On 2L while ambulating=95%                                               CHF by history.                                               Call to [facility] requesting new portable 02 tank and new home 02.             Action/Plan:   Expected Discharge Date:                  Expected Discharge Plan:     In-House Referral:     Discharge planning Services     Post Acute Care Choice:    Choice offered to:     DME Arranged:    DME Agency:     HH Arranged:    HH Agency:     Status of Service:     If discussed at [employer] of Stay Meetings, dates discussed:    Additional Comments:  Creed Kail A, RN 10/23/2017, 10:08 AM

## 2017-10-31 NOTE — Progress Notes (Deleted)
   Patient ID: [name] [name], female    DOB: 10-19-1929, 84 y.o.   MRN: [id number]  HPI  [name] [name] is a 84 y/o female with a history of  Echo report from 10/22/17 showed an EF of 55% along with mild MR and elevated PA pressure of 53 mm Hg.   Admitted 10/11/17 due to HF exacerbation. Initially needed IV diuretics and transitioned to oral diuretics. HTN medications adjusted and cardiology consult was obtained. Discharged home after 2 days.   Patient presents for her initial visit with a chief complaint of   Review of Systems    Physical Exam  Assessment & Plan:   1: Chronic heart failure with preserved ejection fraction- - NYHA class - saw cardiologist ([name] [name]) 08/19/17 - BNP 10/21/17 was 418.0  2: HTN- - saw PCP [name] [name]) 04/20/17 - BMP done on 10/23/17 reviewed and showed sodium 136, potassium 4.1 and GFR 52  3: Diabetes-  - A1c on 04/20/17 was 7.4%

## 2017-11-01 ENCOUNTER — Telehealth: Payer: Self-pay | Admitting: Family

## 2017-11-01 ENCOUNTER — Ambulatory Visit: Payer: Medicare HMO | Admitting: Family

## 2017-11-01 NOTE — Telephone Encounter (Signed)
Patient missed her initial appointment at the [facility] 11/01/17. Will attempt to reschedule.

## 2018-02-14 ENCOUNTER — Ambulatory Visit
Admission: RE | Admit: 2018-02-14 | Discharge: 2018-02-14 | Disposition: A | Discharge status: Home / Self Care | Payer: Medicare HMO | Source: Ambulatory Visit | Attending: Family Medicine | Admitting: Family Medicine

## 2018-02-14 DIAGNOSIS — R928 Other abnormal and inconclusive findings on diagnostic imaging of breast: Secondary | ICD-10-CM

## 2018-02-15 ENCOUNTER — Other Ambulatory Visit: Payer: Self-pay | Admitting: Family Medicine

## 2018-02-15 DIAGNOSIS — N183 Chronic kidney disease, stage 3 (moderate): Secondary | ICD-10-CM

## 2018-02-15 DIAGNOSIS — E1122 Type 2 diabetes mellitus with diabetic chronic kidney disease: Secondary | ICD-10-CM

## 2018-02-15 DIAGNOSIS — R251 Tremor, unspecified: Secondary | ICD-10-CM

## 2018-02-23 ENCOUNTER — Ambulatory Visit
Admission: RE | Admit: 2018-02-23 | Discharge: 2018-02-23 | Disposition: A | Discharge status: Home / Self Care | Payer: Medicare HMO | Source: Ambulatory Visit | Attending: Family Medicine | Admitting: Family Medicine

## 2018-02-23 DIAGNOSIS — E1122 Type 2 diabetes mellitus with diabetic chronic kidney disease: Secondary | ICD-10-CM | POA: Diagnosis not present

## 2018-02-23 DIAGNOSIS — Z794 Long term (current) use of insulin: Secondary | ICD-10-CM | POA: Diagnosis not present

## 2018-02-23 DIAGNOSIS — N183 Chronic kidney disease, stage 3 (moderate): Secondary | ICD-10-CM | POA: Insufficient documentation

## 2018-02-23 DIAGNOSIS — Z8673 Personal history of transient ischemic attack (TIA), and cerebral infarction without residual deficits: Secondary | ICD-10-CM | POA: Insufficient documentation

## 2018-02-23 DIAGNOSIS — R251 Tremor, unspecified: Secondary | ICD-10-CM | POA: Insufficient documentation

## 2018-04-30 ENCOUNTER — Other Ambulatory Visit: Payer: Self-pay

## 2018-04-30 ENCOUNTER — Emergency Department
Admission: EM | Admit: 2018-04-30 | Discharge: 2018-04-30 | Disposition: A | Discharge status: Home / Self Care | Payer: Medicare HMO | Attending: Emergency Medicine | Admitting: Emergency Medicine

## 2018-04-30 ENCOUNTER — Emergency Department: Payer: Medicare HMO

## 2018-04-30 ENCOUNTER — Encounter: Payer: Self-pay | Admitting: Emergency Medicine

## 2018-04-30 DIAGNOSIS — Y929 Unspecified place or not applicable: Secondary | ICD-10-CM | POA: Insufficient documentation

## 2018-04-30 DIAGNOSIS — S0990XA Unspecified injury of head, initial encounter: Secondary | ICD-10-CM | POA: Diagnosis present

## 2018-04-30 DIAGNOSIS — I10 Essential (primary) hypertension: Secondary | ICD-10-CM | POA: Insufficient documentation

## 2018-04-30 DIAGNOSIS — Z7982 Long term (current) use of aspirin: Secondary | ICD-10-CM | POA: Diagnosis not present

## 2018-04-30 DIAGNOSIS — Z79899 Other long term (current) drug therapy: Secondary | ICD-10-CM | POA: Diagnosis not present

## 2018-04-30 DIAGNOSIS — E119 Type 2 diabetes mellitus without complications: Secondary | ICD-10-CM | POA: Diagnosis not present

## 2018-04-30 DIAGNOSIS — Z7902 Long term (current) use of antithrombotics/antiplatelets: Secondary | ICD-10-CM | POA: Insufficient documentation

## 2018-04-30 DIAGNOSIS — Y9301 Activity, walking, marching and hiking: Secondary | ICD-10-CM | POA: Diagnosis not present

## 2018-04-30 DIAGNOSIS — I509 Heart failure, unspecified: Secondary | ICD-10-CM | POA: Insufficient documentation

## 2018-04-30 DIAGNOSIS — S0101XA Laceration without foreign body of scalp, initial encounter: Secondary | ICD-10-CM | POA: Diagnosis not present

## 2018-04-30 DIAGNOSIS — Z794 Long term (current) use of insulin: Secondary | ICD-10-CM | POA: Diagnosis not present

## 2018-04-30 DIAGNOSIS — I110 Hypertensive heart disease with heart failure: Secondary | ICD-10-CM | POA: Insufficient documentation

## 2018-04-30 DIAGNOSIS — W0110XA Fall on same level from slipping, tripping and stumbling with subsequent striking against unspecified object, initial encounter: Secondary | ICD-10-CM | POA: Diagnosis not present

## 2018-04-30 DIAGNOSIS — Y999 Unspecified external cause status: Secondary | ICD-10-CM | POA: Diagnosis not present

## 2018-04-30 DIAGNOSIS — R55 Syncope and collapse: Secondary | ICD-10-CM

## 2018-04-30 LAB — COMPREHENSIVE METABOLIC PANEL
ALK PHOS: 87 U/L (ref 38–126)
ALT: 11 U/L (ref 0–44)
ANION GAP: 11 (ref 5–15)
AST: 25 U/L (ref 15–41)
Albumin: 3.8 g/dL (ref 3.5–5.0)
BUN: 48 mg/dL — ABNORMAL HIGH (ref 8–23)
CALCIUM: 10.1 mg/dL (ref 8.9–10.3)
CO2: 30 mmol/L (ref 22–32)
Chloride: 97 mmol/L — ABNORMAL LOW (ref 98–111)
Creatinine, Ser: 1.35 mg/dL — ABNORMAL HIGH (ref 0.44–1.00)
GFR, EST AFRICAN AMERICAN: 40 mL/min — AB (ref >60)
GFR, EST NON AFRICAN AMERICAN: 34 mL/min — AB (ref >60)
Glucose, Bld: 128 mg/dL — ABNORMAL HIGH (ref 70–99)
Potassium: 4.7 mmol/L (ref 3.5–5.1)
SODIUM: 138 mmol/L (ref 135–145)
TOTAL PROTEIN: 8.1 g/dL (ref 6.5–8.1)
Total Bilirubin: 0.6 mg/dL (ref 0.3–1.2)

## 2018-04-30 LAB — URINALYSIS, COMPLETE (UACMP) WITH MICROSCOPIC
BILIRUBIN URINE: NEGATIVE
GLUCOSE, UA: NEGATIVE mg/dL
HGB URINE DIPSTICK: NEGATIVE
Ketones, ur: NEGATIVE mg/dL
Nitrite: NEGATIVE
Protein, ur: NEGATIVE mg/dL
SPECIFIC GRAVITY, URINE: 1.006 (ref 1.005–1.030)
pH: 6 (ref 5.0–8.0)

## 2018-04-30 LAB — CBC
HEMATOCRIT: 34.9 % — AB (ref 35.0–47.0)
HEMOGLOBIN: 11.3 g/dL — AB (ref 12.0–16.0)
MCH: 31.9 pg (ref 26.0–34.0)
MCHC: 32.4 g/dL (ref 32.0–36.0)
MCV: 98.5 fL (ref 80.0–100.0)
PLATELETS: 154 10*3/uL (ref 150–440)
RBC: 3.54 MIL/uL — AB (ref 3.80–5.20)
RDW: 18.4 % — ABNORMAL HIGH (ref 11.5–14.5)
WBC: 11.1 10*3/uL — AB (ref 3.6–11.0)

## 2018-04-30 LAB — TROPONIN I

## 2018-04-30 NOTE — ED Provider Notes (Signed)
[facility] Emergency Department Provider Note ____________________________________________   First MD Initiated Contact with Patient 04/30/18 1823     (approximate)  I have reviewed the triage vital signs and the nursing notes.   HISTORY  Chief Complaint Near Syncope and Head Injury    HPI [name] [name] is a 84 y.o. female who presents with head injury, acute onset directly prior to arrival, associated with a laceration to the back of her head, and occurring after the patient became weak and felt like she was going to pass out.  Patient states that she felt well earlier today.  She went to go shoe shopping, and when she was walking around she started to become somewhat short of breath, lightheaded, and felt hot.  She then felt weak and fell to the floor, hitting her head.  She did not pass out and denies any LOC.  The patient's son noted that her oxygen triggers when she inhales and he did not hear it triggering and thinks that the patient may not have been taking enough breaths.  The patient states that she now feels back to her baseline.   Past Medical History:  Diagnosis Date  . Diabetes mellitus without complication ([place])   . Hypertension   . Thyroid disease     Patient Active Problem List   Diagnosis Date Noted  . CHF (congestive heart failure) ([place]) 10/21/2017  . Sepsis ([place]) 07/20/2016  . CAP (community acquired pneumonia) 07/20/2016  . Elevated troponin 07/20/2016  . RENAL FAILURE, CHRONIC 07/17/2008  . Diabetes ([place]) 06/12/2008  . OSTEOPENIA 06/12/2008  . Essential hypertension 10/12/2007  . INTERSTITIAL LUNG DISEASE 10/12/2007  . OTH SPEC ALVEOL&PARIETOALVEOL PNEUMONOPATHIES 10/12/2007  . HYPERGLYCEMIA 10/12/2007    Past Surgical History:  Procedure Laterality Date  . ABDOMINAL HYSTERECTOMY    . BREAST BIOPSY Right 20+yrs ago   negative    Prior to Admission medications   Medication Sig Start Date End Date Taking?  Authorizing Provider  allopurinol (ZYLOPRIM) 300 MG tablet Take 300 mg by mouth daily. 06/14/16   [provider]  amLODipine (NORVASC) 5 MG tablet Take 1 tablet (5 mg total) by mouth daily. 07/23/16   Gladstone Lighter, MD  aspirin EC 81 MG tablet Take 81 mg by mouth daily.    [provider]  cholecalciferol (VITAMIN D) 1000 units tablet Take 1,000 Units by mouth daily at 12 noon.    [provider]  cloNIDine (CATAPRES) 0.1 MG tablet Take 0.1 mg by mouth 3 (three) times daily.  06/16/16   [provider]  furosemide (LASIX) 40 MG tablet Take 1 tablet (40 mg total) by mouth daily. 07/23/16   Gladstone Lighter, MD  gabapentin (NEURONTIN) 100 MG capsule Take 100 mg by mouth 3 (three) times daily. 06/14/16   [provider]  glipiZIDE (GLUCOTROL) 10 MG tablet Take 10 mg by mouth 3 (three) times daily. 06/14/16   [provider]  insulin detemir (LEVEMIR) 100 UNIT/ML injection Inject 30 Units into the skin at bedtime.    [provider]  insulin lispro (HUMALOG) 100 UNIT/ML injection Inject 2 Units into the skin 3 (three) times daily before meals. Use 2 units for every 50 glucometer points over 200 with your after meal glucose checks    [provider]  levothyroxine (SYNTHROID, LEVOTHROID) 100 MCG tablet Take 100 mcg by mouth daily. 06/14/16   [provider]  metoprolol succinate (TOPROL-XL) 100 MG 24 hr tablet Take 1 tablet (100 mg  total) by mouth 2 (two) times daily. 07/22/16   Gladstone Lighter, MD  Multiple Vitamin (MULTIVITAMIN WITH MINERALS) TABS tablet Take 1 tablet by mouth daily at 12 noon.    [provider]  omega-3 acid ethyl esters (LOVAZA) 1 g capsule Take 1 g by mouth daily at 12 noon.    [provider]  omeprazole (PRILOSEC) 20 MG capsule Take 20 mg by mouth at bedtime. 07/07/16   [provider]  pravastatin (PRAVACHOL) 80 MG tablet Take 80 mg by mouth at bedtime. 11/08/15    [provider]  rOPINIRole (REQUIP) 0.5 MG tablet Take 1 mg by mouth at bedtime. 05/21/16   [provider]  triamcinolone cream (KENALOG) 0.1 % Apply 1 application topically 2 (two) times daily. 07/07/16   [provider]  vitamin B-12 (CYANOCOBALAMIN) 1000 MCG tablet Take 1,000 mcg by mouth daily at 12 noon.    [provider]    Allergies Codeine and Sulfonamide derivatives  Family History  Problem Relation Age of Onset  . Breast cancer Mother 33    Social History Social History   Tobacco Use  . Smoking status: Never Smoker  . Smokeless tobacco: Never Used  Substance Use Topics  . Alcohol use: No    Frequency: Never  . Drug use: Not on file    Review of Systems  Constitutional: No fever. Eyes: No visual changes. ENT: No neck pain. Cardiovascular: Denies chest pain. Respiratory: Denies shortness of breath. Gastrointestinal: No vomiting. Genitourinary: Negative for dysuria.  Musculoskeletal: Negative for back pain. Skin: Negative for rash. Neurological: Negative for headache.   ____________________________________________   PHYSICAL EXAM:  VITAL SIGNS: ED Triage Vitals  Enc Vitals Group     BP 04/30/18 1550 (!) 173/65     Pulse Rate 04/30/18 1550 72     Resp 04/30/18 1550 20     Temp 04/30/18 1550 98 F (36.7 C)     Temp Source 04/30/18 1550 Oral     SpO2 04/30/18 1550 90 %     Weight 04/30/18 1553 167 lb (75.8 kg)     Height 04/30/18 1553 5' (1.524 m)     Head Circumference --      Peak Flow --      Pain Score 04/30/18 1552 1     Pain Loc --      Pain Edu? --      Excl. in [place]? --     Constitutional: Alert and oriented. Well appearing for age and in no acute distress. Eyes: Conjunctivae are normal.  EOMI.  PERRLA. Head: 1 cm laceration to left superior occipital area. Nose: No congestion/rhinnorhea. Mouth/Throat: Mucous membranes are moist.   Neck: Normal range of motion.  No midline spinal  tenderness. Cardiovascular:Good peripheral circulation. Respiratory: Normal respiratory effort.  Gastrointestinal: No distention.  Musculoskeletal:  Extremities warm and well perfused.  Neurologic:  Normal speech and language.  Motor intact in all extremities.  Normal coordination.  No gross focal neurologic deficits are appreciated.  Skin:  Skin is warm and dry. No rash noted. Psychiatric: Mood and affect are normal. Speech and behavior are normal.  ____________________________________________   LABS (all labs ordered are listed, but only abnormal results are displayed)  Labs Reviewed  CBC - Abnormal; Notable for the following components:      Result Value   WBC 11.1 (*)    RBC 3.54 (*)    Hemoglobin 11.3 (*)    HCT 34.9 (*)    RDW 18.4 (*)  All other components within normal limits  URINALYSIS, COMPLETE (UACMP) WITH MICROSCOPIC - Abnormal; Notable for the following components:   Color, Urine STRAW (*)    APPearance CLEAR (*)    Leukocytes, UA TRACE (*)    Bacteria, UA FEW (*)    All other components within normal limits  COMPREHENSIVE METABOLIC PANEL - Abnormal; Notable for the following components:   Chloride 97 (*)    Glucose, Bld 128 (*)    BUN 48 (*)    Creatinine, Ser 1.35 (*)    GFR calc non Af Amer 34 (*)    GFR calc Af Amer 40 (*)    All other components within normal limits  TROPONIN I   ____________________________________________  EKG  ED ECG REPORT I, Arta Silence, the attending physician, personally viewed and interpreted this ECG.  Date: 04/30/2018 EKG Time: 1602 Rate: 70 Rhythm: normal sinus rhythm QRS Axis: normal Intervals: Prolonged PR ST/T Wave abnormalities: normal Narrative Interpretation: no evidence of acute ischemia  ____________________________________________  RADIOLOGY  CT head: No ICH or other acute intracranial findings   ____________________________________________   PROCEDURES  Procedure(s) performed:  Yes  .Marland KitchenLaceration Repair Date/Time: 04/30/2018 7:12 PM Performed by: Arta Silence, MD Authorized by: Arta Silence, MD   Consent:    Consent obtained:  Verbal   Consent given by:  Patient   Risks discussed:  Infection, pain and poor wound healing Anesthesia (see MAR for exact dosages):    Anesthesia method:  None Laceration details:    Location:  Scalp   Scalp location:  Occipital   Length (cm):  1   Depth (mm):  4 Repair type:    Repair type:  Simple Exploration:    Hemostasis achieved with:  Direct pressure   Wound exploration: entire depth of wound probed and visualized     Contaminated: no   Treatment:    Area cleansed with:  Saline   Amount of cleaning:  Standard   Visualized foreign bodies/material removed: no   Skin repair:    Repair method:  Staples   Number of staples:  3 Approximation:    Approximation:  Close Post-procedure details:    Dressing:  Open (no dressing)   Patient tolerance of procedure:  Tolerated well, no immediate complications    Critical Care performed: No ____________________________________________   INITIAL IMPRESSION / ASSESSMENT AND PLAN / ED COURSE  Pertinent labs & imaging results that were available during my care of the patient were reviewed by me and considered in my medical decision making (see chart for details).  84 year old female with PMH as noted above including chronic lung disease on home O2 presents with an episode of near syncope, resulting in a head injury with occipital scalp laceration.  The patient reports a prodrome of lightheadedness and shortness of breath, and there is some history that the home oxygen may not have been giving her enough for 2 or if she may not have been triggering it adequately with her breaths.  She now feels back to her baseline.  On exam, the patient is quite well-appearing for age, vital signs are normal except for hypertension, and the remainder of the exam is as described.  She  does have a scalp laceration but no other acute trauma.  O2 saturation is in the high 90s on patient's normal O2.  Overall presentation is consistent with vasovagal near syncope; I suspect the patient also could have become hypoxic due to the oxygen not adequately triggering.  The family members state  that they have a second oxygen tank system that they can use as a replacement.  Lab work-up obtained from triage is unremarkable.  Laceration was repaired without difficulty.  CT head was obtained and shows no ICH or other acute intracranial findings.  The patient feels well and would like to go home.  Given the negative work-up and the likely cause of not getting enough oxygen from her tank, there is no indication for admission at this time.  I counseled the patient and her family members on the results of the work-up.  She is stable for discharge at this time.  Return precautions given, and the patient and family expressed understanding.   ____________________________________________   FINAL CLINICAL IMPRESSION(S) / ED DIAGNOSES  Final diagnoses:  Near syncope  Injury of head, initial encounter  Laceration of scalp, initial encounter      NEW MEDICATIONS STARTED DURING THIS VISIT:  New Prescriptions   No medications on file     Note:  This document was prepared using Dragon voice recognition software and may include unintentional dictation errors.    Arta Silence, MD 04/30/18 [phone]

## 2018-04-30 NOTE — Discharge Instructions (Addendum)
Return here or see your regular doctor in 1 week to have the staples removed.  Return to the ER for new, worsening, or recurrent lightheadedness or feeling like you are going to pass out, difficulty breathing, chest pain, or any other new or worsening symptoms that concern you.

## 2018-04-30 NOTE — ED Triage Notes (Addendum)
States about 1 hour ago felt weak and fell to floor. Denies LOC. Hit head. Has hematoma back of scalp with dry blood noted. During triage patient noted on her home 02 at 3l with SAT 88 to 90 however notice it does not make the sound of triggering. Put on our 02 2l and SAT increase to 100%

## 2018-08-18 ENCOUNTER — Other Ambulatory Visit: Payer: Self-pay | Admitting: Family Medicine

## 2018-08-18 ENCOUNTER — Other Ambulatory Visit: Payer: Self-pay | Admitting: Gastroenterology

## 2018-08-18 DIAGNOSIS — N6310 Unspecified lump in the right breast, unspecified quadrant: Secondary | ICD-10-CM

## 2018-08-18 DIAGNOSIS — R748 Abnormal levels of other serum enzymes: Secondary | ICD-10-CM

## 2018-08-18 DIAGNOSIS — R1012 Left upper quadrant pain: Secondary | ICD-10-CM

## 2018-08-24 ENCOUNTER — Ambulatory Visit: Admission: RE | Admit: 2018-08-24 | Payer: Medicare HMO | Source: Ambulatory Visit

## 2018-08-24 ENCOUNTER — Other Ambulatory Visit: Payer: Self-pay | Admitting: Gastroenterology

## 2018-08-24 DIAGNOSIS — R748 Abnormal levels of other serum enzymes: Secondary | ICD-10-CM

## 2018-08-24 DIAGNOSIS — R1012 Left upper quadrant pain: Secondary | ICD-10-CM

## 2018-08-25 ENCOUNTER — Ambulatory Visit
Admission: RE | Admit: 2018-08-25 | Discharge: 2018-08-25 | Disposition: A | Discharge status: Home / Self Care | Payer: Medicare HMO | Source: Ambulatory Visit | Attending: Gastroenterology | Admitting: Gastroenterology

## 2018-08-25 DIAGNOSIS — R1012 Left upper quadrant pain: Secondary | ICD-10-CM | POA: Diagnosis present

## 2018-08-25 DIAGNOSIS — K5730 Diverticulosis of large intestine without perforation or abscess without bleeding: Secondary | ICD-10-CM | POA: Diagnosis not present

## 2018-08-25 DIAGNOSIS — I700 Atherosclerosis of aorta: Secondary | ICD-10-CM | POA: Diagnosis not present

## 2018-08-25 DIAGNOSIS — R748 Abnormal levels of other serum enzymes: Secondary | ICD-10-CM | POA: Insufficient documentation

## 2018-09-07 ENCOUNTER — Ambulatory Visit: Payer: Medicare HMO

## 2018-09-20 ENCOUNTER — Ambulatory Visit
Admission: RE | Admit: 2018-09-20 | Discharge: 2018-09-20 | Disposition: A | Discharge status: Home / Self Care | Payer: Medicare HMO | Source: Ambulatory Visit | Attending: Family Medicine | Admitting: Family Medicine

## 2018-09-20 DIAGNOSIS — N6310 Unspecified lump in the right breast, unspecified quadrant: Secondary | ICD-10-CM

## 2018-12-15 DIAGNOSIS — D539 Nutritional anemia, unspecified: Secondary | ICD-10-CM | POA: Insufficient documentation

## 2018-12-15 NOTE — Progress Notes (Signed)
[facility] day:  12/16/2018  Chief Complaint: [name] [name] is a 85 y.o. female with anemia who is referred in consultation by Dr. Gayland Curry for assessment and management.  HPI:  The patient denies any prior history of anemia.  She states that her "iron is a little low".  She states that she has not been eating as much as she used to eat.  She has no appetite.  She denies any melena or hematochezia.  Lab review: 04/20/2017:  Hematocrit 41.8, hemoglobin 13.3, MCV 98.0, platelets 121,000, WBC 13,300 05/16/2018:  Hematocrit 33.0, hemoglobin 10.4, MCV 101.0, platelets 170,000, WBC 10,400 08/18/2018:  Hematocrit 34.8, hemoglobin 10.8, MCV 104.5, platelets 117,000, WBC 10,800  Cr. 1.5. 12/05/2018:  Hematocrit 35.4, hemoglobin 10.3, MCV 104.0, platelets 165,000, WBC 11,100.  Cr 1.2.  Labs on 12/05/2018 revealed a ferritin 57, iron saturation 15% , TIBC 319, B12 2787, and folate > 45.  Symptomatically, she denies any fevers or sweats. She complains of a poor appetite, which has caused her to lose 10 pounds in little over a year. Weight today is 163 lb 2.3 oz (74 kg). She denies nausea, vomiting, and changes to her bowel habits. Patient denies bleeding; no hematochezia, melena, or gross hematuria. Last colonoscopy was 12/23/2004. Patient states, "I will never have another one". Patient maintains a diet [place] in iron. She indicates that she eats meat and green leafy vegetables on a consistent basis.   Patient with chronic shortness of breath. She has been on supplemental oxygen at 2L/[place] x 1 year. Patient has an intermittently productive cough (dark sputum). She has issues with urinary incontinence. PMH (+) for psoriasis.   Patient is able to actively participate in her activities of daily living. She denies the use if any new medications or herbal products.   Patient denies family history that is significant for any type of hematologic disorder. She  has 4 sisters who passed away from lung cancer.   Patient denies any pain in the clinic today.    Past Medical History:  Diagnosis Date  . Diabetes mellitus without complication ([place])   . Hypertension   . Thyroid disease     Past Surgical History:  Procedure Laterality Date  . ABDOMINAL HYSTERECTOMY    . BREAST BIOPSY Right 20+yrs ago   negative    Family History  Problem Relation Age of Onset  . Breast cancer Mother 21    Social History:  reports that she has never smoked. She has never used smokeless tobacco. She reports that she does not drink alcohol. No history on file for drug.  Patient is a former smoker for over 30 years. She does not drink. She previously worked as a Dance movement psychotherapist.  The patient lives in [place].  She is widowed. Her adult son lives with her. Patient denies known exposures to radiation on toxins.  The patient is accompanied by her son today.  Allergies:  Allergies  Allergen Reactions  . Ace Inhibitors Cough and Other (See Comments)     hyperkalemia  hyperkalemia   . Codeine Nausea Only  . Sulfonamide Derivatives Other (See Comments)    Reaction: unknown    Current Medications: Current Outpatient Medications  Medication Sig Dispense Refill  . allopurinol (ZYLOPRIM) 300 MG tablet Take 300 mg by mouth daily.    Marland Kitchen amLODipine (NORVASC) 5 MG tablet Take 1 tablet (5 mg total) by mouth daily. 30 tablet 0  . aspirin EC 81 MG tablet  Take 81 mg by mouth daily.    . cholecalciferol (VITAMIN D) 1000 units tablet Take 1,000 Units by mouth daily at 12 noon.    . cloNIDine (CATAPRES) 0.1 MG tablet Take 0.1 mg by mouth 3 (three) times daily.     . furosemide (LASIX) 40 MG tablet Take 1 tablet (40 mg total) by mouth daily. 30 tablet 2  . gabapentin (NEURONTIN) 100 MG capsule Take 100 mg by mouth 3 (three) times daily.    Marland Kitchen glipiZIDE (GLUCOTROL) 10 MG tablet Take 10 mg by mouth 3 (three) times daily.    . insulin detemir (LEVEMIR) 100 UNIT/ML injection Inject  30 Units into the skin at bedtime.    . insulin lispro (HUMALOG) 100 UNIT/ML injection Inject 2 Units into the skin 3 (three) times daily before meals. Use 2 units for every 50 glucometer points over 200 with your after meal glucose checks    . levothyroxine (SYNTHROID, LEVOTHROID) 100 MCG tablet Take 100 mcg by mouth daily.    . metoprolol succinate (TOPROL-XL) 100 MG 24 hr tablet Take 1 tablet (100 mg total) by mouth 2 (two) times daily. 60 tablet 2  . Multiple Vitamin (MULTIVITAMIN WITH MINERALS) TABS tablet Take 1 tablet by mouth daily at 12 noon.    Marland Kitchen omega-3 acid ethyl esters (LOVAZA) 1 g capsule Take 1 g by mouth daily at 12 noon.    Marland Kitchen omeprazole (PRILOSEC) 20 MG capsule Take 20 mg by mouth at bedtime.    . pravastatin (PRAVACHOL) 80 MG tablet Take 80 mg by mouth at bedtime.    Marland Kitchen rOPINIRole (REQUIP) 0.25 MG tablet Take by mouth.    Marland Kitchen rOPINIRole (REQUIP) 0.5 MG tablet One to Two pills by mouth at bedtime.    . triamcinolone cream (KENALOG) 0.1 % Apply 1 application topically 2 (two) times daily.    . vitamin B-12 (CYANOCOBALAMIN) 1000 MCG tablet Take 1,000 mcg by mouth daily at 12 noon.     No current facility-administered medications for this visit.     Review of Systems:  GENERAL:  Feels "fine".  No fevers, sweats.  Weight loss of 9-10 pounds in > 1 year. PERFORMANCE STATUS (ECOG):  1-2. HEENT:  "Sight fading" over the past few years.  No runny nose, sore throat, mouth sores or tenderness. Lungs: No shortness of breath.  Little cough "off and on".  No hemoptysis.  On oxygen 2 liters via [place] x 1 year. Cardiac:  No chest pain, palpitations, orthopnea, or PND. GI:  No nausea, vomiting, diarrhea, constipation, melena or hematochezia.  Stool gray/black (not on iron).  Guaiac - last week. GU:  Chronic urinary symptoms.  Incontinence when standing up.  No urgency, frequency, dysuria, or hematuria. Musculoskeletal:  "Aches and pains".  No back pain.  No joint pain.  No muscle  tenderness. Extremities:  No pain or swelling. Skin:  Psoriasis.  No skin changes. Neuro:  Tremors x 1 year.  No headache, numbness or weakness, balance or coordination issues. Endocrine:  Diabetes.  Thyroid disease on Synthroid.  No hot flashes or night sweats. Psych:  No mood changes, depression or anxiety. Pain:  No focal pain. Review of systems:  All other systems reviewed and found to be negative.  Physical Exam: Blood pressure (!) 164/80, pulse 68, temperature (!) 97.2 F (36.2 C), temperature source Tympanic, resp. rate 18, height '5\' 2"'  (1.575 m), weight 163 lb 2.3 oz (74 kg), SpO2 93 %. GENERAL:  Well developed, well nourished, woman sitting comfortably  in the exam room in no acute distress. MENTAL STATUS:  Alert and oriented to person, place and time. HEAD:  Short gray hair.  Normocephalic, atraumatic, face symmetric, no Cushingoid features. EYES:  Blue eyes.  Pupils equal round and reactive to light and accomodation.  No conjunctivitis or scleral icterus. ENT:  [place] in place.  Oropharynx clear without lesion.  Tongue normal. Mucous membranes dry.  RESPIRATORY:  Clear to auscultation without rales, wheezes or rhonchi. CARDIOVASCULAR:  Regular rate and rhythm without murmur, rub or gallop. ABDOMEN:  Soft, non-tender, with active bowel sounds, and no hepatosplenomegaly.  No masses. SKIN:  Scattered bruises.  No rashes, ulcers or lesions. EXTREMITIES: Cool fingers.  Tender muscles.  No edema or skin discoloration.  No palpable cords. LYMPH NODES: No palpable cervical, supraclavicular, axillary or inguinal adenopathy  NEUROLOGICAL: Unremarkable. PSYCH:  Appropriate.   No visits with results within 3 Day(s) from this visit.  Latest known visit with results is:  Admission on 04/30/2018, Discharged on 04/30/2018  Component Date Value Ref Range Status  . WBC 04/30/2018 11.1* 3.6 - 11.0 K/uL Final  . RBC 04/30/2018 3.54* 3.80 - 5.20 MIL/uL Final  . Hemoglobin 04/30/2018 11.3* 12.0 -  16.0 g/dL Final  . HCT 04/30/2018 34.9* 35.0 - 47.0 % Final  . MCV 04/30/2018 98.5  80.0 - 100.0 fL Final  . MCH 04/30/2018 31.9  26.0 - 34.0 pg Final  . MCHC 04/30/2018 32.4  32.0 - 36.0 g/dL Final  . RDW 04/30/2018 18.4* 11.5 - 14.5 % Final  . Platelets 04/30/2018 154  150 - 440 K/uL Final   Performed at [facility], [address], [place], [place] [address]  . Color, Urine 04/30/2018 STRAW* YELLOW Final  . APPearance 04/30/2018 CLEAR* CLEAR Final  . Specific Gravity, Urine 04/30/2018 1.006  1.005 - 1.030 Final  . pH 04/30/2018 6.0  5.0 - 8.0 Final  . Glucose, UA 04/30/2018 NEGATIVE  NEGATIVE mg/dL Final  . Hgb urine dipstick 04/30/2018 NEGATIVE  NEGATIVE Final  . Bilirubin Urine 04/30/2018 NEGATIVE  NEGATIVE Final  . Ketones, ur 04/30/2018 NEGATIVE  NEGATIVE mg/dL Final  . Protein, ur 04/30/2018 NEGATIVE  NEGATIVE mg/dL Final  . Nitrite 04/30/2018 NEGATIVE  NEGATIVE Final  . Leukocytes, UA 04/30/2018 TRACE* NEGATIVE Final  . RBC / HPF 04/30/2018 0-5  0 - 5 RBC/hpf Final  . WBC, UA 04/30/2018 0-5  0 - 5 WBC/hpf Final  . Bacteria, UA 04/30/2018 FEW* NONE SEEN Final  . Squamous Epithelial / LPF 04/30/2018 0-5  0 - 5 Final  . Mucus 04/30/2018 PRESENT   Final  . Hyaline Casts, UA 04/30/2018 PRESENT   Final   Performed at [facility], [address] [place], [place] [address]  . Sodium 04/30/2018 138  135 - 145 mmol/L Final  . Potassium 04/30/2018 4.7  3.5 - 5.1 mmol/L Final  . Chloride 04/30/2018 97* 98 - 111 mmol/L Final   Please note change in reference range.  . CO2 04/30/2018 30  22 - 32 mmol/L Final  . Glucose, Bld 04/30/2018 128* 70 - 99 mg/dL Final   Please note change in reference range.  . BUN 04/30/2018 48* 8 - 23 mg/dL Final   Please note change in reference range.  . Creatinine, Ser 04/30/2018 1.35* 0.44 - 1.00 mg/dL Final  . Calcium 04/30/2018 10.1  8.9 - 10.3 mg/dL Final  . Total Protein 04/30/2018 8.1  6.5 - 8.1 g/dL Final  . Albumin  04/30/2018 3.8  3.5 -  5.0 g/dL Final  . AST 04/30/2018 25  15 - 41 U/L Final  . ALT 04/30/2018 11  0 - 44 U/L Final   Please note change in reference range.  . Alkaline Phosphatase 04/30/2018 87  38 - 126 U/L Final  . Total Bilirubin 04/30/2018 0.6  0.3 - 1.2 mg/dL Final  . GFR calc non Af Amer 04/30/2018 34* >60 mL/min Final  . GFR calc Af Amer 04/30/2018 40* >60 mL/min Final   Comment: (NOTE) The eGFR has been calculated using the CKD EPI equation. This calculation has not been validated in all clinical situations. eGFR's persistently <60 mL/min signify possible Chronic Kidney Disease.   Georgiann Hahn gap 04/30/2018 11  5 - 15 Final   Performed at [facility], [address] [place], [place] [address]  . Troponin I 04/30/2018 <0.03  <0.03 ng/mL Final   Performed at [facility], [address] [place], [place] [address]    Assessment:  [name] [name] is a 85 y.o. female with a progressive macrocytic anemia over the past 7 months.  Labs on 12/05/2018 revealed a hematocrit 35.4, hemoglobin 10.3, MCV 104, platelets 165,000, WBC 11,100.  Creatinine was 1.2, ferritin 57, iron saturation 15% , TIBC 319, B12 2787, and folate > 45.  She has chronic renal disease.  Last colonoscopy was 12/23/2004.  She declines future colonoscopies.  Stool was recently guaiac negative.  Symptomatically, she has lost 10 pounds over the past year.  Appetite has decreased. Exam is unremarkable.  Plan: 1.   Labs today:  CBC with diff, LFTs, TSH, free T4, retic, SPEP, FLCA. 2.   Peripheral smear for path review. 3.   Macrocytic anemia  Anemia has been progressive over the past 7 months.  Normal B12, folate.  Patient with no known liver disease.  Patient is hypothyroid on Synthroid.  Possible myelodysplasia.  Discuss additional labs. 4.   RTC in 1 week for review of work-up and discussion regarding direction of therapy.   Honor Loh, NP  12/16/2018, 2:28 PM   I saw and evaluated the  patient, participating in the key portions of the service and reviewing pertinent diagnostic studies and records.  I reviewed the nurse practitioner's note and agree with the findings and the plan.  The assessment and plan were discussed with the patient. Multiple questions were asked by the patient and answered.   Nolon Stalls, MD 12/16/2018,2:28 PM

## 2018-12-16 ENCOUNTER — Other Ambulatory Visit: Payer: Medicare HMO

## 2018-12-16 ENCOUNTER — Encounter: Payer: Self-pay | Admitting: Hematology and Oncology

## 2018-12-16 ENCOUNTER — Inpatient Hospital Stay: Payer: Medicare HMO | Attending: Hematology and Oncology | Admitting: Hematology and Oncology

## 2018-12-16 VITALS — BP 164/80 | HR 68 | Temp 97.2°F | Resp 18 | Ht 62.0 in | Wt 163.1 lb

## 2018-12-16 DIAGNOSIS — R0602 Shortness of breath: Secondary | ICD-10-CM | POA: Insufficient documentation

## 2018-12-16 DIAGNOSIS — J3489 Other specified disorders of nose and nasal sinuses: Secondary | ICD-10-CM | POA: Diagnosis not present

## 2018-12-16 DIAGNOSIS — D539 Nutritional anemia, unspecified: Secondary | ICD-10-CM | POA: Insufficient documentation

## 2018-12-16 DIAGNOSIS — D721 Eosinophilia: Secondary | ICD-10-CM | POA: Diagnosis not present

## 2018-12-16 DIAGNOSIS — I129 Hypertensive chronic kidney disease with stage 1 through stage 4 chronic kidney disease, or unspecified chronic kidney disease: Secondary | ICD-10-CM

## 2018-12-16 DIAGNOSIS — Z794 Long term (current) use of insulin: Secondary | ICD-10-CM | POA: Diagnosis not present

## 2018-12-16 DIAGNOSIS — N189 Chronic kidney disease, unspecified: Secondary | ICD-10-CM

## 2018-12-16 DIAGNOSIS — Z87891 Personal history of nicotine dependence: Secondary | ICD-10-CM | POA: Diagnosis not present

## 2018-12-16 DIAGNOSIS — R05 Cough: Secondary | ICD-10-CM | POA: Diagnosis not present

## 2018-12-16 DIAGNOSIS — E1122 Type 2 diabetes mellitus with diabetic chronic kidney disease: Secondary | ICD-10-CM | POA: Insufficient documentation

## 2018-12-16 DIAGNOSIS — E039 Hypothyroidism, unspecified: Secondary | ICD-10-CM | POA: Insufficient documentation

## 2018-12-16 DIAGNOSIS — Z7984 Long term (current) use of oral hypoglycemic drugs: Secondary | ICD-10-CM

## 2018-12-16 DIAGNOSIS — R634 Abnormal weight loss: Secondary | ICD-10-CM | POA: Diagnosis not present

## 2018-12-16 DIAGNOSIS — Z79899 Other long term (current) drug therapy: Secondary | ICD-10-CM

## 2018-12-16 DIAGNOSIS — R32 Unspecified urinary incontinence: Secondary | ICD-10-CM | POA: Diagnosis not present

## 2018-12-16 DIAGNOSIS — E079 Disorder of thyroid, unspecified: Secondary | ICD-10-CM

## 2018-12-16 DIAGNOSIS — D649 Anemia, unspecified: Secondary | ICD-10-CM

## 2018-12-16 LAB — CBC WITH DIFFERENTIAL/PLATELET
ABS IMMATURE GRANULOCYTES: 0.03 10*3/uL (ref 0.00–0.07)
BASOS ABS: 0.1 10*3/uL (ref 0.0–0.1)
Basophils Relative: 1 %
EOS ABS: 0.7 10*3/uL — AB (ref 0.0–0.5)
Eosinophils Relative: 6 %
HEMATOCRIT: 35.7 % — AB (ref 36.0–46.0)
Hemoglobin: 11.3 g/dL — ABNORMAL LOW (ref 12.0–15.0)
IMMATURE GRANULOCYTES: 0 %
LYMPHS ABS: 3.1 10*3/uL (ref 0.7–4.0)
Lymphocytes Relative: 27 %
MCH: 33.2 pg (ref 26.0–34.0)
MCHC: 31.7 g/dL (ref 30.0–36.0)
MCV: 105 fL — AB (ref 80.0–100.0)
Monocytes Absolute: 1 10*3/uL (ref 0.1–1.0)
Monocytes Relative: 8 %
NEUTROS PCT: 58 %
NRBC: 0 % (ref 0.0–0.2)
Neutro Abs: 6.6 10*3/uL (ref 1.7–7.7)
Platelets: 161 10*3/uL (ref 150–400)
RBC: 3.4 MIL/uL — ABNORMAL LOW (ref 3.87–5.11)
RDW: 15 % (ref 11.5–15.5)
WBC: 11.4 10*3/uL — ABNORMAL HIGH (ref 4.0–10.5)

## 2018-12-16 LAB — HEPATIC FUNCTION PANEL
ALT: 12 U/L (ref 0–44)
AST: 27 U/L (ref 15–41)
Albumin: 3.4 g/dL — ABNORMAL LOW (ref 3.5–5.0)
Alkaline Phosphatase: 83 U/L (ref 38–126)
BILIRUBIN TOTAL: 0.2 mg/dL — AB (ref 0.3–1.2)
Total Protein: 8.2 g/dL — ABNORMAL HIGH (ref 6.5–8.1)

## 2018-12-16 LAB — T4, FREE: FREE T4: 1.24 ng/dL (ref 0.82–1.77)

## 2018-12-16 LAB — RETICULOCYTES
IMMATURE RETIC FRACT: 26.1 % — AB (ref 2.3–15.9)
RBC.: 3.54 MIL/uL — AB (ref 3.87–5.11)
RETIC COUNT ABSOLUTE: 71.2 10*3/uL (ref 19.0–186.0)
RETIC CT PCT: 2 % (ref 0.4–3.1)

## 2018-12-16 LAB — TSH: TSH: 0.553 u[IU]/mL (ref 0.350–4.500)

## 2018-12-19 LAB — KAPPA/LAMBDA LIGHT CHAINS
KAPPA FREE LGHT CHN: 94.9 mg/L — AB (ref 3.3–19.4)
Kappa, lambda light chain ratio: 1.76 — ABNORMAL HIGH (ref 0.26–1.65)
Lambda free light chains: 54 mg/L — ABNORMAL HIGH (ref 5.7–26.3)

## 2018-12-19 LAB — PROTEIN ELECTROPHORESIS, SERUM
A/G Ratio: 0.7 (ref 0.7–1.7)
ALPHA-1-GLOBULIN: 0.3 g/dL (ref 0.0–0.4)
ALPHA-2-GLOBULIN: 1.1 g/dL — AB (ref 0.4–1.0)
Albumin ELP: 3.2 g/dL (ref 2.9–4.4)
Beta Globulin: 0.9 g/dL (ref 0.7–1.3)
GLOBULIN, TOTAL: 4.3 g/dL — AB (ref 2.2–3.9)
Gamma Globulin: 1.9 g/dL — ABNORMAL HIGH (ref 0.4–1.8)
M-SPIKE, %: 0.4 g/dL — AB
Total Protein ELP: 7.5 g/dL (ref 6.0–8.5)

## 2018-12-19 LAB — PATHOLOGIST SMEAR REVIEW

## 2018-12-22 ENCOUNTER — Inpatient Hospital Stay: Payer: Medicare HMO

## 2018-12-22 ENCOUNTER — Encounter: Payer: Self-pay | Admitting: Hematology and Oncology

## 2018-12-22 ENCOUNTER — Inpatient Hospital Stay (HOSPITAL_BASED_OUTPATIENT_CLINIC_OR_DEPARTMENT_OTHER): Payer: Medicare HMO | Admitting: Hematology and Oncology

## 2018-12-22 VITALS — BP 160/80 | HR 59 | Temp 97.5°F | Resp 18 | Wt 160.4 lb

## 2018-12-22 DIAGNOSIS — Z87891 Personal history of nicotine dependence: Secondary | ICD-10-CM

## 2018-12-22 DIAGNOSIS — E039 Hypothyroidism, unspecified: Secondary | ICD-10-CM

## 2018-12-22 DIAGNOSIS — N189 Chronic kidney disease, unspecified: Secondary | ICD-10-CM

## 2018-12-22 DIAGNOSIS — Z794 Long term (current) use of insulin: Secondary | ICD-10-CM

## 2018-12-22 DIAGNOSIS — R634 Abnormal weight loss: Secondary | ICD-10-CM

## 2018-12-22 DIAGNOSIS — R05 Cough: Secondary | ICD-10-CM

## 2018-12-22 DIAGNOSIS — R0602 Shortness of breath: Secondary | ICD-10-CM | POA: Diagnosis not present

## 2018-12-22 DIAGNOSIS — D721 Eosinophilia, unspecified: Secondary | ICD-10-CM

## 2018-12-22 DIAGNOSIS — R32 Unspecified urinary incontinence: Secondary | ICD-10-CM

## 2018-12-22 DIAGNOSIS — J3489 Other specified disorders of nose and nasal sinuses: Secondary | ICD-10-CM | POA: Diagnosis not present

## 2018-12-22 DIAGNOSIS — E1122 Type 2 diabetes mellitus with diabetic chronic kidney disease: Secondary | ICD-10-CM

## 2018-12-22 DIAGNOSIS — R778 Other specified abnormalities of plasma proteins: Secondary | ICD-10-CM

## 2018-12-22 DIAGNOSIS — D539 Nutritional anemia, unspecified: Secondary | ICD-10-CM

## 2018-12-22 DIAGNOSIS — I129 Hypertensive chronic kidney disease with stage 1 through stage 4 chronic kidney disease, or unspecified chronic kidney disease: Secondary | ICD-10-CM

## 2018-12-22 DIAGNOSIS — Z79899 Other long term (current) drug therapy: Secondary | ICD-10-CM

## 2018-12-22 DIAGNOSIS — D472 Monoclonal gammopathy: Secondary | ICD-10-CM | POA: Insufficient documentation

## 2018-12-22 NOTE — Progress Notes (Signed)
[name] [name] day:  12/22/2018  Chief Complaint: [name] [name] is a 85 y.o. female with a macrocytic anemia who is seen for review of work-up and discussion regarding direction of therapy.  HPI:  The patient was last seen in the hematology [name] on 12/16/2018 for initial consultation.  She had progressive macrocytic anemia since 04/2018.  Appetite was poor.  She had lost 10 pounds in the past year.  She denied any melena, hematochezia, hematuria or vaginal bleeding.  Work-up revealed a hematocrit of 35.7, hemoglobin 11.3, MCV 105.0, platelets 161,000, WBC 11,400 with an ANC of 6600.  Eosinophil count was 700 (0-500).  Retic was 2%.  LFTs revealed a protein of 8.2 and an albumin of 3.4.  SPEP revealed an M-spike of 0.4 gm/dL.  Kappa free light chains were 94.9, lambda free light chains were 54 with a ratio of 1.76 (0.26-1.65).  TSH was 0.553 (normal).  Free T4 was 1.24  Peripheral smear revealed adequate platelets. There  was mild anemia with mild macrocytosis. Neutrophil, lymphocyte, and monocyte numbers and morphology was unremarkable. Mild eosinophilia was confirmed. There were no immature cells.   During the interim, patient notes that she has been doing "really good".  She has had some increased coughing and rhinorrhea since her last visit. She has issues with seasonal allergies. Patient denies that she has experienced any B symptoms. She denies any interval infections.   Patient advises that she maintains an adequate appetite. She is eating well. Weight today is 160 lb 6.2 oz (72.7 kg), which compared to her last visit to the [name], represents a 3 pound decrease.   Patient denies pain in the [name] today.   Past Medical History:  Diagnosis Date  . Diabetes mellitus without complication ([place])   . Hypertension   . Thyroid disease     Past Surgical History:  Procedure Laterality Date  . ABDOMINAL HYSTERECTOMY    . BREAST BIOPSY Right  20+yrs ago   negative    Family History  Problem Relation Age of Onset  . Breast cancer Mother 70    Social History:  reports that she has never smoked. She has never used smokeless tobacco. She reports that she does not drink alcohol. No history on file for drug.  Patient is a former smoker for over 30 years. She does not drink.The patient lives in [place].  She is widowed. Her adult son lives with her. Patient denies known exposures to radiation on toxins.  The patient is accompanied by her son today.  Allergies:  Allergies  Allergen Reactions  . Ace Inhibitors Cough and Other (See Comments)     hyperkalemia  hyperkalemia   . Codeine Nausea Only  . Sulfonamide Derivatives Other (See Comments)    Reaction: unknown    Current Medications: Current Outpatient Medications  Medication Sig Dispense Refill  . allopurinol (ZYLOPRIM) 300 MG tablet Take 300 mg by mouth daily.    Marland Kitchen amLODipine (NORVASC) 5 MG tablet Take 1 tablet (5 mg total) by mouth daily. 30 tablet 0  . aspirin EC 81 MG tablet Take 81 mg by mouth daily.    . cholecalciferol (VITAMIN D) 1000 units tablet Take 1,000 Units by mouth daily at 12 noon.    . cloNIDine (CATAPRES) 0.1 MG tablet Take 0.1 mg by mouth 3 (three) times daily.     . furosemide (LASIX) 40 MG tablet Take 1 tablet (40 mg total) by mouth daily. 30 tablet 2  .  gabapentin (NEURONTIN) 100 MG capsule Take 100 mg by mouth 3 (three) times daily.    Marland Kitchen glipiZIDE (GLUCOTROL) 10 MG tablet Take 10 mg by mouth 3 (three) times daily.    . insulin detemir (LEVEMIR) 100 UNIT/ML injection Inject 12-14 Units into the skin at bedtime.     . insulin lispro (HUMALOG) 100 UNIT/ML injection Inject 2 Units into the skin daily as needed. Use 2 units for every 50 glucometer points over 200 with your after meal glucose checks    . levothyroxine (SYNTHROID, LEVOTHROID) 100 MCG tablet Take 100 mcg by mouth daily.    . metoprolol succinate (TOPROL-XL) 100 MG 24 hr tablet Take 1  tablet (100 mg total) by mouth 2 (two) times daily. 60 tablet 2  . Multiple Vitamin (MULTIVITAMIN WITH MINERALS) TABS tablet Take 1 tablet by mouth daily at 12 noon.    Marland Kitchen omega-3 acid ethyl esters (LOVAZA) 1 g capsule Take 1 g by mouth daily at 12 noon.    . pravastatin (PRAVACHOL) 80 MG tablet Take 80 mg by mouth at bedtime.    . ranitidine (ZANTAC) 150 MG tablet Take 150 mg by mouth at bedtime.    Marland Kitchen rOPINIRole (REQUIP) 0.25 MG tablet Take 0.25 mg by mouth 3 (three) times daily.     Marland Kitchen rOPINIRole (REQUIP) 0.5 MG tablet One to Two pills by mouth at bedtime.    . triamcinolone cream (KENALOG) 0.1 % Apply 1 application topically 2 (two) times daily.    . vitamin B-12 (CYANOCOBALAMIN) 1000 MCG tablet Take 1,000 mcg by mouth daily at 12 noon.     No current facility-administered medications for this visit.     Review of Systems:  GENERAL:  "I'm doing good for an old woman".  No fevers, sweats.  Weight loss of 3 pounds. PERFORMANCE STATUS (ECOG):  1-2. HEENT:  Runny nose secondary to allergies.  Poor vision- chronic.  No sore throat, mouth sores or tenderness. Lungs: Shortness of breath with exertion.  Increased cough.  No hemoptysis.  Chronic oxygen 2 liters via [place]. Cardiac:  No chest pain, palpitations, orthopnea, or PND. GI:  No nausea, vomiting, diarrhea, constipation, melena or hematochezia. GU:  Incontinence on standing.  No urgency, frequency, dysuria, or hematuria. Musculoskeletal:  "Aches and pains".  Extremities:  No pain or swelling. Skin:  Psoriasis. Neuro:  Tremors.  No headache, numbness or weakness, balance or coordination issues. Endocrine:  Diabetes.  Thyroid disease on Synthroid.  No hot flashes or night sweats. Psych:  No mood changes, depression or anxiety. Pain:  No focal pain. Review of systems:  All other systems reviewed and found to be negative.   Physical Exam: Blood pressure (!) 160/80, pulse (!) 59, temperature (!) 97.5 F (36.4 C), resp. rate 18, weight 160 lb  6.2 oz (72.7 kg), SpO2 93 %. GENERAL:  Well developed, well nourished, elderly woman sitting comfortably in the exam room in no acute distress. MENTAL STATUS:  Alert and oriented to person, place and time. HEAD:  Short gray hair.  Normocephalic, atraumatic, face symmetric, no Cushingoid features. EYES:  Glasses.  Blue eyes. No conjunctivitis or scleral icterus. ENT:  [place] in place.  NEUROLOGICAL: Unremarkable. PSYCH:  Appropriate.   No visits with results within 3 Day(s) from this visit.  Latest known visit with results is:  Appointment on 12/16/2018  Component Date Value Ref Range Status  . Path Review 12/16/2018 Blood smear reviewed.   Final   Comment: Platelets are adequate. There is mild anemia  with mild macrocytosis. Neutrophil, lymphocyte, and monocyte numbers and morphology are unremarkable. Mild eosinophilia is confirmed. No immature cells are seen.  Reviewed by Lemmie Evens. Dicie Beam, MD. Performed at [facility], [address], [place], [place] [address]   . Kappa free light chain 12/16/2018 94.9* 3.3 - 19.4 mg/L Final  . Lamda free light chains 12/16/2018 54.0* 5.7 - 26.3 mg/L Final  . Kappa, lamda light chain ratio 12/16/2018 1.76* 0.26 - 1.65 Final   Comment: (NOTE) Performed At: [facility] [place], [place] [id number] Rush Farmer MD [id number]   . Total Protein ELP 12/16/2018 7.5  6.0 - 8.5 g/dL Final  . Albumin ELP 12/16/2018 3.2  2.9 - 4.4 g/dL Final  . Alpha-1-Globulin 12/16/2018 0.3  0.0 - 0.4 g/dL Final  . Alpha-2-Globulin 12/16/2018 1.1* 0.4 - 1.0 g/dL Final  . Beta Globulin 12/16/2018 0.9  0.7 - 1.3 g/dL Final  . Gamma Globulin 12/16/2018 1.9* 0.4 - 1.8 g/dL Final  . M-Spike, % 12/16/2018 0.4* Not Observed g/dL Final  . SPE Interp. 12/16/2018 Comment   Final   Comment: (NOTE) The SPE pattern demonstrates a single peak (M-spike) in the gamma region which may represent monoclonal protein. This peak may also be caused by  circulating immune complexes, cryoglobulins, C-reactive protein, fibrinogen or hemolysis.  If clinically indicated, the presence of a monoclonal gammopathy may be confirmed by immuno- fixation, as well as an evaluation of the urine for the presence of Bence-Jones protein. Performed At: [facility] [place], [place] [id number] Rush Farmer MD IR:[id number]   . Comment 12/16/2018 Comment   Final   Comment: (NOTE) Protein electrophoresis scan will follow via computer, mail, or courier delivery.   . Globulin, Total 12/16/2018 4.3* 2.2 - 3.9 g/dL Corrected  . A/G Ratio 12/16/2018 0.7  0.7 - 1.7 Corrected  . Retic Ct Pct 12/16/2018 2.0  0.4 - 3.1 % Final  . RBC. 12/16/2018 3.54* 3.87 - 5.11 MIL/uL Final  . Retic Count, Absolute 12/16/2018 71.2  19.0 - 186.0 K/uL Final  . Immature Retic Fract 12/16/2018 26.1* 2.3 - 15.9 % Final   Performed at [facility], [address] [place], [place] [address]  . Free T4 12/16/2018 1.24  0.82 - 1.77 ng/dL Final   Comment: (NOTE) Biotin ingestion may interfere with free T4 tests. If the results are inconsistent with the TSH level, previous test results, or the clinical presentation, then consider biotin interference. If needed, order repeat testing after stopping biotin. Performed at [facility], [address], [place], [place] [address]   . TSH 12/16/2018 0.553  0.350 - 4.500 uIU/mL Final   Comment: Performed by a 3rd Generation assay with a functional sensitivity of <=0.01 uIU/mL. Performed at [facility], [address], [place], [place] [address]   . Total Protein 12/16/2018 8.2* 6.5 - 8.1 g/dL Final  . Albumin 12/16/2018 3.4* 3.5 - 5.0 g/dL Final  . AST 12/16/2018 27  15 - 41 U/L Final  . ALT 12/16/2018 12  0 - 44 U/L Final  . Alkaline Phosphatase 12/16/2018 83  38 - 126 U/L Final  . Total Bilirubin 12/16/2018 0.2* 0.3 - 1.2 mg/dL Final  . Bilirubin, Direct 12/16/2018 <0.1  0.0 - 0.2  mg/dL Final  . Indirect Bilirubin 12/16/2018 NOT CALCULATED  0.3 - 0.9 mg/dL Final   Performed at [facility], [address] [place], [place] [address]  . WBC 12/16/2018 11.4* 4.0 - 10.5  K/uL Final  . RBC 12/16/2018 3.40* 3.87 - 5.11 MIL/uL Final  . Hemoglobin 12/16/2018 11.3* 12.0 - 15.0 g/dL Final  . HCT 12/16/2018 35.7* 36.0 - 46.0 % Final  . MCV 12/16/2018 105.0* 80.0 - 100.0 fL Final  . MCH 12/16/2018 33.2  26.0 - 34.0 pg Final  . MCHC 12/16/2018 31.7  30.0 - 36.0 g/dL Final  . RDW 12/16/2018 15.0  11.5 - 15.5 % Final  . Platelets 12/16/2018 161  150 - 400 K/uL Final  . nRBC 12/16/2018 0.0  0.0 - 0.2 % Final  . Neutrophils Relative % 12/16/2018 58  % Final  . Neutro Abs 12/16/2018 6.6  1.7 - 7.7 K/uL Final  . Lymphocytes Relative 12/16/2018 27  % Final  . Lymphs Abs 12/16/2018 3.1  0.7 - 4.0 K/uL Final  . Monocytes Relative 12/16/2018 8  % Final  . Monocytes Absolute 12/16/2018 1.0  0.1 - 1.0 K/uL Final  . Eosinophils Relative 12/16/2018 6  % Final  . Eosinophils Absolute 12/16/2018 0.7* 0.0 - 0.5 K/uL Final  . Basophils Relative 12/16/2018 1  % Final  . Basophils Absolute 12/16/2018 0.1  0.0 - 0.1 K/uL Final  . Immature Granulocytes 12/16/2018 0  % Final  . Abs Immature Granulocytes 12/16/2018 0.03  0.00 - 0.07 K/uL Final   Performed at [facility], [address], [place], [place] [address]    Assessment:  Ercie Eliasen is a 85 y.o. female with a progressive macrocytic anemia over the past 7 months.  Labs on 12/05/2018 revealed a hematocrit 35.4, hemoglobin 10.3, MCV 104, platelets 165,000, WBC 11,100.  Cr was 1.2, ferritin 57, iron saturation 15% , TIBC 319, B12 2787, and folate > 45.  Work-up on 12/16/2018 revealed a hematocrit of 35.7, hemoglobin 11.3, MCV 105.0, platelets 161,000, WBC 11,400 with an ANC of 6600.  Eosinophil count was 700 (0-500).  Retic was 2%.  LFTs revealed a protein of 8.2 and an albumen of 3.4.  SPEP revealed a M-spike  of 0.4 gm/dL.  Kappa free light chains were 94.9, lambda free light chains were 54 with a ratio of 1.76 (0.26-1.65).  TSH was 0.553 (normal).  Free T4 was 1.24.  She has chronic renal disease.  Creatinine has ranged between 1.2 - 1.8 in the past year.  Last colonoscopy was 12/23/2004.  She declines future colonoscopies.  Stool was recently guaiac negative.  Symptomatically, she feels "good".  She notes allergy symptoms due to yellow bells.  She has lost 3 pounds in the past week.  Exam is stable.  Plan: 1.   Labs today: myeloma panel. 2.   Collect 24 hour urine for UPEP and free light chains. 3.   Macrocytic anemia  Progressive anemia over the past 7 months.  Patient has a normal B12, folate.  She has no known liver disease.    Thyroid function is normal on Synthroid.  She may have an underlying myelodysplasia. 4.   Abnormal M-spike  Possible monoclonal gammopathy.  Check myeloma panel (immunoglobulin levels, M-spike, and immunofixation). 5.   Eosinophilia  Mild.  Intermittent on review of prior labs.  Patient notes ongoing allergies to yellow bells. 6.   RTC in 2 weeks for MD assessment, review of labs, and discussion regarding direction of therapy.   Honor Loh, NP  12/22/2018, 3:53 PM   I saw and evaluated the patient, participating in the key portions of the service and reviewing pertinent diagnostic studies and records.  I reviewed the nurse  practitioner's note and agree with the findings and the plan.  The assessment and plan were discussed with the patient.  Several questions were asked by the patient and answered.   Nolon Stalls, MD 12/22/2018,3:53 PM

## 2018-12-22 NOTE — Progress Notes (Signed)
Pt here for follow up. Denies any concerns at this time.  

## 2018-12-26 LAB — MULTIPLE MYELOMA PANEL, SERUM
ALBUMIN/GLOB SERPL: 0.8 (ref 0.7–1.7)
Albumin SerPl Elph-Mcnc: 3.1 g/dL (ref 2.9–4.4)
Alpha 1: 0.3 g/dL (ref 0.0–0.4)
Alpha2 Glob SerPl Elph-Mcnc: 1.2 g/dL — ABNORMAL HIGH (ref 0.4–1.0)
B-Globulin SerPl Elph-Mcnc: 1 g/dL (ref 0.7–1.3)
Gamma Glob SerPl Elph-Mcnc: 1.8 g/dL (ref 0.4–1.8)
Globulin, Total: 4.3 g/dL — ABNORMAL HIGH (ref 2.2–3.9)
IGG (IMMUNOGLOBIN G), SERUM: 1693 mg/dL — AB (ref 700–1600)
IGM (IMMUNOGLOBULIN M), SRM: 435 mg/dL — AB (ref 26–217)
IgA: 352 mg/dL (ref 64–422)
M Protein SerPl Elph-Mcnc: 0.5 g/dL — ABNORMAL HIGH
TOTAL PROTEIN ELP: 7.4 g/dL (ref 6.0–8.5)

## 2018-12-27 ENCOUNTER — Other Ambulatory Visit: Payer: Self-pay

## 2018-12-27 DIAGNOSIS — R778 Other specified abnormalities of plasma proteins: Secondary | ICD-10-CM

## 2018-12-28 LAB — IFE+PROTEIN ELECTRO, 24-HR UR
% BETA, Urine: 20.7 %
ALBUMIN, U: 46.5 %
ALPHA 1 URINE: 5.4 %
Alpha 2, Urine: 9.5 %
GAMMA GLOBULIN URINE: 17.9 %
TOTAL PROTEIN, URINE-UPE24: 6.4 mg/dL
TOTAL PROTEIN, URINE-UR/DAY: 48 mg/(24.h) (ref 30–150)
Total Volume: 750

## 2019-01-06 ENCOUNTER — Encounter: Payer: Self-pay | Admitting: Hematology and Oncology

## 2019-01-06 ENCOUNTER — Other Ambulatory Visit: Payer: Self-pay

## 2019-01-06 ENCOUNTER — Inpatient Hospital Stay: Payer: Medicare HMO | Attending: Hematology and Oncology | Admitting: Hematology and Oncology

## 2019-01-06 VITALS — BP 133/71 | HR 63 | Temp 97.1°F | Resp 18 | Ht 62.0 in | Wt 159.7 lb

## 2019-01-06 DIAGNOSIS — Z794 Long term (current) use of insulin: Secondary | ICD-10-CM | POA: Diagnosis not present

## 2019-01-06 DIAGNOSIS — D472 Monoclonal gammopathy: Secondary | ICD-10-CM | POA: Insufficient documentation

## 2019-01-06 DIAGNOSIS — D721 Eosinophilia, unspecified: Secondary | ICD-10-CM | POA: Insufficient documentation

## 2019-01-06 DIAGNOSIS — D539 Nutritional anemia, unspecified: Secondary | ICD-10-CM | POA: Diagnosis present

## 2019-01-06 DIAGNOSIS — I129 Hypertensive chronic kidney disease with stage 1 through stage 4 chronic kidney disease, or unspecified chronic kidney disease: Secondary | ICD-10-CM | POA: Diagnosis not present

## 2019-01-06 DIAGNOSIS — E119 Type 2 diabetes mellitus without complications: Secondary | ICD-10-CM

## 2019-01-06 DIAGNOSIS — E079 Disorder of thyroid, unspecified: Secondary | ICD-10-CM

## 2019-01-06 DIAGNOSIS — Z87891 Personal history of nicotine dependence: Secondary | ICD-10-CM | POA: Diagnosis not present

## 2019-01-06 DIAGNOSIS — N189 Chronic kidney disease, unspecified: Secondary | ICD-10-CM

## 2019-01-06 DIAGNOSIS — R05 Cough: Secondary | ICD-10-CM | POA: Diagnosis not present

## 2019-01-06 DIAGNOSIS — R0989 Other specified symptoms and signs involving the circulatory and respiratory systems: Secondary | ICD-10-CM | POA: Diagnosis not present

## 2019-01-06 DIAGNOSIS — Z515 Encounter for palliative care: Secondary | ICD-10-CM | POA: Insufficient documentation

## 2019-01-06 DIAGNOSIS — Z7982 Long term (current) use of aspirin: Secondary | ICD-10-CM

## 2019-01-06 DIAGNOSIS — Z90710 Acquired absence of both cervix and uterus: Secondary | ICD-10-CM | POA: Diagnosis not present

## 2019-01-06 DIAGNOSIS — Z7189 Other specified counseling: Secondary | ICD-10-CM

## 2019-01-06 DIAGNOSIS — Z79899 Other long term (current) drug therapy: Secondary | ICD-10-CM | POA: Insufficient documentation

## 2019-01-06 NOTE — Progress Notes (Signed)
[facility] day:  01/06/2019  Chief Complaint: [name] [name] is a 85 y.o. female with a macrocytic anemia and an abnormal M-spike who is seen for review of additional work-up and discussion regarding direction of therapy.  HPI:  The patient was last seen in the hematology clinic on 12/22/2018.  At that time, she felt "good".  She noted allergy symptoms due to yellow bells.  She had lost 3 pounds in the past week.  Initial work-up suggested a an underlying myelodysplastic syndrome regarding her macrocytic anemia.  M-spike revealed a 0.4 gm/dL spike.  Additional work-up revealed a 0.5 gm/dL IgM monoclonal protein with kappa light chain specificity.  IgG was 1693, IgA 352, and IgM 435 (26-217).  24 hour UPEP was normal.  During the interim, she denies any new complaints.  She states "Ia m alright, I reckon".  Weight is down 1 pound.  She states that her weight fluctuates day to day based on "fluid".  She weighs herself daily because of fluid issues.  Her son does note that she does not eat as much as she previously ate.  She has a "little cough" (chronic) for which she says has been noted to have "Crispy Creme" lungs.  She denies any new respiratoratory symptoms.  She denies any fever.   Past Medical History:  Diagnosis Date  . Diabetes mellitus without complication ([place])   . Hypertension   . Thyroid disease     Past Surgical History:  Procedure Laterality Date  . ABDOMINAL HYSTERECTOMY    . BREAST BIOPSY Right 20+yrs ago   negative    Family History  Problem Relation Age of Onset  . Breast cancer Mother 49    Social History:  reports that she has never smoked. She has never used smokeless tobacco. She reports that she does not drink alcohol. No history on file for drug.  Patient is a former smoker for over 30 years. She does not drink.The patient lives in [place].  She is widowed. Her adult son lives with her. Patient denies known  exposures to radiation on toxins.  The patient is accompanied by her son, [name], today.  Allergies:  Allergies  Allergen Reactions  . Ace Inhibitors Cough and Other (See Comments)     hyperkalemia  hyperkalemia   . Codeine Nausea Only  . Sulfonamide Derivatives Other (See Comments)    Reaction: unknown    Current Medications: Current Outpatient Medications  Medication Sig Dispense Refill  . allopurinol (ZYLOPRIM) 300 MG tablet Take 300 mg by mouth daily.    Marland Kitchen amLODipine (NORVASC) 5 MG tablet Take 1 tablet (5 mg total) by mouth daily. 30 tablet 0  . aspirin EC 81 MG tablet Take 81 mg by mouth daily.    . cholecalciferol (VITAMIN D) 1000 units tablet Take 1,000 Units by mouth daily at 12 noon.    . cloNIDine (CATAPRES) 0.1 MG tablet Take 0.1 mg by mouth 3 (three) times daily.     . furosemide (LASIX) 40 MG tablet Take 1 tablet (40 mg total) by mouth daily. 30 tablet 2  . gabapentin (NEURONTIN) 100 MG capsule Take 100 mg by mouth 3 (three) times daily.    Marland Kitchen glipiZIDE (GLUCOTROL) 10 MG tablet Take 10 mg by mouth 3 (three) times daily.    . insulin detemir (LEVEMIR) 100 UNIT/ML injection Inject 12-14 Units into the skin at bedtime.     . insulin lispro (HUMALOG) 100 UNIT/ML injection Inject 2  Units into the skin daily as needed. Use 2 units for every 50 glucometer points over 200 with your after meal glucose checks    . levothyroxine (SYNTHROID, LEVOTHROID) 100 MCG tablet Take 100 mcg by mouth daily.    . metoprolol succinate (TOPROL-XL) 100 MG 24 hr tablet Take 1 tablet (100 mg total) by mouth 2 (two) times daily. 60 tablet 2  . Multiple Vitamin (MULTIVITAMIN WITH MINERALS) TABS tablet Take 1 tablet by mouth daily at 12 noon.    Marland Kitchen omega-3 acid ethyl esters (LOVAZA) 1 g capsule Take 1 g by mouth daily at 12 noon.    . pravastatin (PRAVACHOL) 80 MG tablet Take 80 mg by mouth at bedtime.    . ranitidine (ZANTAC) 150 MG tablet Take 150 mg by mouth at bedtime.    . triamcinolone cream  (KENALOG) 0.1 % Apply 1 application topically 2 (two) times daily.    . vitamin B-12 (CYANOCOBALAMIN) 1000 MCG tablet Take 1,000 mcg by mouth daily at 12 noon.    Marland Kitchen rOPINIRole (REQUIP) 0.25 MG tablet Take 0.25 mg by mouth 3 (three) times daily.     Marland Kitchen rOPINIRole (REQUIP) 0.5 MG tablet One to Two pills by mouth at bedtime.     No current facility-administered medications for this visit.     Review of Systems:  GENERAL:  Feels "alright I reckon".  No fevers, sweats.  Weight fluctuates secondary to fluid.  Weight down 1 pound since last visit. PERFORMANCE STATUS (ECOG):  2 HEENT:  Runny nose secondary to allergies (yellow bells).  Poor vision.  No sore throat, mouth sores or tenderness. Lungs:  Chronic shortness of breath.  "Little cough".  No hemoptysis. Chronic oxygen 2 liters/min via [place]. Cardiac:  No chest pain, palpitations, orthopnea, or PND. GI:  No nausea, vomiting, diarrhea, constipation, melena or hematochezia. GU:  Incontinence on standing.  No urgency, frequency, dysuria, or hematuria. Musculoskeletal:  Aches and pains (old). Extremities:  No pain or swelling. Skin:  Psoriasis. Neuro:  Tremors.  No headache, numbness or weakness, balance or coordination issues. Endocrine:  Diabetes.  Thyroid disease on Synthroid.  No hot flashes or night sweats. Psych:  No mood changes, depression or anxiety. Pain:  No focal pain. Review of systems:  All other systems reviewed and found to be negative.   Physical Exam: Blood pressure 133/71, pulse 63, temperature (!) 97.1 F (36.2 C), temperature source Tympanic, resp. rate 18, height '5\' 2"'  (1.575 m), weight 159 lb 11.6 oz (72.4 kg), SpO2 95 %. GENERAL:  Well developed, well nourished, elderly woman sitting comfortably in the exam room in no acute distress.  She requests to be examined in her chair. MENTAL STATUS:  Alert and oriented to person, place and time. HEAD:  Short gray hair.  Normocephalic, atraumatic, face symmetric, no Cushingoid  features. EYES:  Glasses.  Blue eyes.  Pupils equal round and reactive to light and accomodation.  No conjunctivitis or scleral icterus. ENT:  Oropharynx clear without lesion.  Tongue normal. Mucous membranes dry.  RESPIRATORY:  Diffuse fine dry crackles.  No wheezes or rhonchi. CARDIOVASCULAR:  Regular rate and rhythm without murmur, rub or gallop. ABDOMEN:  Soft, non-tender, with active bowel sounds, and no appreciable hepatosplenomegaly.  No masses. SKIN:  Multiple small areas of ecchymosis on face and upper chest.  No ulcers or lesions. EXTREMITIES: Chronic lower extremity changes (no edema).  No palpable cords. LYMPH NODES: No palpable cervical, supraclavicular, axillary or inguinal adenopathy  NEUROLOGICAL: Unremarkable. PSYCH:  Appropriate.  No visits with results within 3 Day(s) from this visit.  Latest known visit with results is:  Orders Only on 12/27/2018  Component Date Value Ref Range Status  . Total Protein, Urine 12/27/2018 6.4  Not Estab. mg/dL Final  . Total Protein, Urine-Ur/day 12/27/2018 48  30 - 150 mg/24 hr Final  . Albumin, U 12/27/2018 46.5  % Final  . ALPHA 1 URINE 12/27/2018 5.4  % Final  . Alpha 2, Urine 12/27/2018 9.5  % Final  . % BETA, Urine 12/27/2018 20.7  % Final  . GAMMA GLOBULIN URINE 12/27/2018 17.9  % Final  . M-SPIKE %, Urine 12/27/2018 Not Observed  Not Observed % Final  . Immunofixation Result, Urine 12/27/2018 Comment   Final   An apparent normal immunofixation pattern.  . Note: 12/27/2018 Comment   Final   Comment: (NOTE) Protein electrophoresis scan will follow via computer, mail, or courier delivery. Performed At: [facility] [place], [place] [id number] Rush Farmer MD KA:[id number]   . Total Volume 12/27/2018 750   Final   Performed at [facility], [address] [place], [place] [address]    Assessment:  [name] [name] is a 85 y.o. female with a progressive macrocytic anemia over the  past 7 months.  Labs on 12/05/2018 revealed a hematocrit 35.4, hemoglobin 10.3, MCV 104, platelets 165,000, WBC 11,100.  Cr was 1.2, ferritin 57, iron saturation 15% , TIBC 319, B12 2787, and folate > 45.  Work-up on 12/16/2018 revealed a hematocrit of 35.7, hemoglobin 11.3, MCV 105.0, platelets 161,000, WBC 11,400 with an ANC of 6600.  Eosinophil count was 700 (0-500).  Retic was 2%.  LFTs revealed a protein of 8.2 and an albumen of 3.4.  SPEP revealed a M-spike of 0.4 gm/dL.  Kappa free light chains were 94.9, lambda free light chains were 54 with a ratio of 1.76 (0.26-1.65).  TSH was 0.553 (normal).  Free T4 was 1.24.  She has an IgM monoclonal gammopathy.  Work-up on 12/22/2018 revealed a 0.5 gm/dL IgM monoclonal protein with kappa light chain specificity.  IgG was 1693, IgA 352, and IgM 435 (26-217).  24 hour UPEP was normal.  She declines further work-up at this time.  She has chronic renal disease.  Creatinine has ranged between 1.2 - 1.8 in the past year.  Last colonoscopy was 12/23/2004.  She declines future colonoscopies.  Stool was recently guaiac negative.  Symptomatically, she feels "alright".  She notes that her weight fluctuates depending on fluid.  Her son feels that her appetite is decreased.  She denies any fever, sweats, or early satiety.  She denies any bone pain.  Exam reveals no adenopathy or appreciable hepatosplenomegaly.  She has dry bilateral crackles (chonic per patient).  Plan: 1.   Review work-up. 2.   Macrocytic anemia  Etiology felt most likely secondary to myelodysplasia.  Normal studies include B12, folate, TSH, LFTs.    Discuss plan for monitoring.  If hemoglobin approaches 10, consideration of bone marrow and possible Procrit/Retacrit to prevent need for transfusion. 4.   IgM monoclonal gammopathy  IgM spike was 0.5 gm/dL (low) with an IgM of 435 and a normal IgG.  Discuss differential diagnosis:  MGUS, Waldenstrom's macroglobulinemia, IgM multiple myeloma  (rare).  MGUS has IgM spike < 3, < 10% plasma cells in marrow, no B symptoms, no anemia, adenopathy, hepatosplenomegaly.  She feels her weight loss is fluid related.  Anemia is likely due to MDS.   Discuss consideration  of further work-up (marrow, imaging).  Patient declines.  "I don't think a whole lot needs to be done".  Discuss ongoing surveillance per patient request. 5.   Eosinophilia  Mild.  Etiology likely secondary to allergies (yellow bells). 6.   Chronic lung disease  Patient has bronchiolitis obliterans per notes.  Patient on chronic oxygen.  Patient notes "[place] Creme" lungs.  Exam appears similar to 12/05/2018 exam.  Patient declines CXR.  Patient encouraged to follow-up with Dr. Astrid Divine if worsening re3piratory symptoms. 7.   RTC in 3 months for MD assessment and labs (CBC with diff, CMP, SPEP, IgM, LDH, beta2-microglobulin, uric acid).    Lequita Asal, MD  01/06/2019, 9:30 AM

## 2019-01-06 NOTE — Progress Notes (Signed)
No new changes noted today 

## 2019-04-14 ENCOUNTER — Other Ambulatory Visit: Payer: Medicare HMO

## 2019-04-14 ENCOUNTER — Ambulatory Visit: Payer: Medicare HMO | Admitting: Hematology and Oncology

## 2019-05-10 ENCOUNTER — Other Ambulatory Visit: Payer: Self-pay

## 2019-05-10 ENCOUNTER — Telehealth: Payer: Self-pay

## 2019-05-10 ENCOUNTER — Telehealth: Payer: Self-pay | Admitting: Hematology and Oncology

## 2019-05-10 NOTE — Telephone Encounter (Signed)
Attempted to returns patient's call regarding labs.   Contacted Dr. Larry Sierras office to inquire on labs that were collected. Reports CBC/D and BMP were obtained but results are not back yet. Office is to fax results when they are received.

## 2019-05-10 NOTE — Telephone Encounter (Signed)
-----   Message from Clancy Gourd sent at 05/10/2019 11:06 AM EDT ----- Regarding: Question About Labs to be Drawn Contact: [phone] Hello,  I called this pt to do pre-appt screen and she had a question about Labs to be Drawn tomorrow. She states that she has an appt with Dr. Astrid Divine today and he will drawing labs also. Wants to know if there is a way to just have labs sent over to Dr. Mike Gip instead of duplicating the same tests possibly. She is requesting a callback in regards to this at [phone]. Thanks.

## 2019-05-10 NOTE — Telephone Encounter (Signed)
Spoke with pt to confirm appt date/time, do pre-appt screen which was completed, and adv of Covid-19 guidelines for appt regarding screening questions, temperature check, face mask required, and no visitors allowed °

## 2019-05-10 NOTE — Progress Notes (Signed)
[facility]  [address] [place], [place] [address] Phone: [phone]  Fax: [phone]   Clinic Day:  05/11/2019  Referring physician: Gayland Curry, MD  Chief Complaint: [name] [name] is a 85 y.o. female with an IgM monoclonal gammopathy and a macrocytic anemia who is seen for 4 month assessment.   HPI: The patient was last seen in the hematology clinic on 01/06/2019. At that time, she felt "alright". She noted that her weight fluctuated felt secondary to fluid.  Her son felt that her appetite was decreased. She denied any fever, sweats, or early satiety. She denied any bone pain. Exam revealed no adenopathy or appreciable hepatosplenomegaly. She had dry bilateral crackles (chonic per patient). She continued vitamin B12.  We discussed ongoing surveillance.  During the interim, the patient feels "alright". Her weight is down 11 lbs. She notes feeling full quicker. She ate half a slice of pizza yesterday and she ate the other half today for lunch. She notes drinking boost once daily. She reports that she was able to eat a small bowl of oatmeal and sugar cookies. She reports poor nutrition and she is not eating enough. She is not interested in speaking with a nutritionist.   She has a bruise on her forearm which she attributes to hitting on the car door. She has allergies (yellow bells).   She notes urinary incontinence, and a dry mouth. She is on diuretics.    Past Medical History:  Diagnosis Date   Diabetes mellitus without complication ([place])    Hypertension    Thyroid disease     Past Surgical History:  Procedure Laterality Date   ABDOMINAL HYSTERECTOMY     BREAST BIOPSY Right 20+yrs ago   negative    Family History  Problem Relation Age of Onset   Breast cancer Mother 34    Social History:  reports that she has never smoked. She has never used smokeless tobacco. She reports that she does not drink alcohol. No history on  file for drug. Patient is a former smoker for over 30 years. She does not drink.The patient lives in [place].  She is widowed. Her adult son lives with her. Patient denies known exposures to radiation on toxins. The patient is alone today.  Allergies:  Allergies  Allergen Reactions   Ace Inhibitors Cough and Other (See Comments)     hyperkalemia  hyperkalemia    Codeine Nausea Only   Sulfonamide Derivatives Other (See Comments)    Reaction: unknown    Current Medications: Current Outpatient Medications  Medication Sig Dispense Refill   allopurinol (ZYLOPRIM) 300 MG tablet Take 300 mg by mouth daily.     amLODipine (NORVASC) 5 MG tablet Take 1 tablet (5 mg total) by mouth daily. 30 tablet 0   aspirin EC 81 MG tablet Take 81 mg by mouth daily.     cholecalciferol (VITAMIN D) 1000 units tablet Take 1,000 Units by mouth daily at 12 noon.     cloNIDine (CATAPRES) 0.1 MG tablet Take 0.1 mg by mouth 3 (three) times daily.      furosemide (LASIX) 40 MG tablet Take 1 tablet (40 mg total) by mouth daily. 30 tablet 2   gabapentin (NEURONTIN) 100 MG capsule Take 100 mg by mouth 3 (three) times daily.     glipiZIDE (GLUCOTROL) 10 MG tablet Take 10 mg by mouth 3 (three) times daily.     insulin detemir (LEVEMIR) 100 UNIT/ML injection Inject 12-14 Units into the skin  at bedtime.      insulin lispro (HUMALOG) 100 UNIT/ML injection Inject 2 Units into the skin daily as needed. Use 2 units for every 50 glucometer points over 200 with your after meal glucose checks     levothyroxine (SYNTHROID, LEVOTHROID) 100 MCG tablet Take 100 mcg by mouth daily.     metoprolol succinate (TOPROL-XL) 100 MG 24 hr tablet Take 1 tablet (100 mg total) by mouth 2 (two) times daily. 60 tablet 2   Multiple Vitamin (MULTIVITAMIN WITH MINERALS) TABS tablet Take 1 tablet by mouth daily at 12 noon.     omega-3 acid ethyl esters (LOVAZA) 1 g capsule Take 1 g by mouth daily at 12 noon.     pravastatin  (PRAVACHOL) 80 MG tablet Take 80 mg by mouth at bedtime.     ranitidine (ZANTAC) 150 MG tablet Take 150 mg by mouth at bedtime.     rOPINIRole (REQUIP) 0.25 MG tablet Take 0.25 mg by mouth 3 (three) times daily.      rOPINIRole (REQUIP) 0.5 MG tablet One to Two pills by mouth at bedtime.     triamcinolone cream (KENALOG) 0.1 % Apply 1 application topically 2 (two) times daily.     vitamin B-12 (CYANOCOBALAMIN) 1000 MCG tablet Take 1,000 mcg by mouth daily at 12 noon.     No current facility-administered medications for this visit.     Review of Systems  Constitutional: Positive for weight loss (11 lb). Negative for chills, fever and malaise/fatigue.       Feels "alright". Weight fluctuates secondary to fluid.  HENT: Negative for ear pain, hearing loss, nosebleeds, sinus pain and sore throat.        Runny nose secondary to allergies (yellow bells). Dry mouth.   Eyes: Negative.  Negative for blurred vision, double vision and photophobia.       Poor vision.  Respiratory: Positive for cough ("little cough") and shortness of breath (chronic). Negative for hemoptysis.        Chronic oxygen 2 liters/min via Fortine.  Cardiovascular: Negative.  Negative for chest pain, palpitations and leg swelling.  Gastrointestinal: Negative.  Negative for abdominal pain, blood in stool, constipation, diarrhea, nausea and vomiting.  Genitourinary: Negative for dysuria, hematuria and urgency.       Incontinence on standing.  Musculoskeletal: Negative for back pain, joint pain, myalgias and neck pain.       Aches and pains (chronic).  Skin: Negative for rash.       Psoriasis. Ecchymosis(small areas: face, upper chest, forearms) .   Neurological: Positive for tremors. Negative for dizziness, tingling, sensory change, weakness and headaches.  Endo/Heme/Allergies: Does not bruise/bleed easily.       Diabetes. Thyroid disease on Synthroid.  Psychiatric/Behavioral: Negative for depression and memory loss. The  patient is not nervous/anxious and does not have insomnia.   All other systems reviewed and are negative.  Performance status (ECOG):  2  Vitals Blood pressure 137/74, pulse 63, temperature 98.1 F (36.7 C), temperature source Tympanic, resp. rate 18, height '5\' 2"'  (1.575 m), weight 148 lb 4.2 oz (67.3 kg), SpO2 95 %.   Physical Exam  Constitutional: She is oriented to person, place, and time.  Elderly woman sitting comfortably in a wheelchair in no acute distress.  She is examined in her wheelchair.  HENT:  Head: Normocephalic and atraumatic.  Mouth/Throat: Mucous membranes are dry. No oropharyngeal exudate.  Short gray hair. Dry mouth.  Eyes: Pupils are equal, round, and reactive to light. Conjunctivae  and EOM are normal. No scleral icterus.  Glasses. Blue eyes.  Neck: Normal range of motion. Neck supple. No JVD present.  Cardiovascular: Normal rate, regular rhythm and normal heart sounds.  No murmur heard. Pulmonary/Chest: Effort normal. She has no wheezes. She has no rales (Diffuse fine dry crackles).  Dry crackles at the bases.  Abdominal: Soft. Bowel sounds are normal. She exhibits no mass. There is no abdominal tenderness. There is no rebound and no guarding.  Musculoskeletal: Normal range of motion.        General: No tenderness or edema.     Comments: Chronic lower extremity changes.  Lymphadenopathy:    She has no cervical adenopathy.    She has no axillary adenopathy.       Right: No supraclavicular adenopathy present.       Left: No supraclavicular adenopathy present.  Neurological: She is alert and oriented to person, place, and time. She has normal reflexes.  Skin: Skin is warm and dry. No rash noted. She is not diaphoretic. No erythema.  Multiple small areas of ecchymosis on her upper chest, left wrist, and right 3rd finger.  Psychiatric: She has a normal mood and affect. Her behavior is normal. Judgment and thought content normal.  Nursing note and vitals  reviewed.   Appointment on 05/11/2019  Component Date Value Ref Range Status   LDH 05/11/2019 158  98 - 192 U/L Final   Performed at [facility], [address] [place], [place] [address]   Uric Acid, Serum 05/11/2019 3.7  2.5 - 7.1 mg/dL Final   Performed at [facility], [address] [place], [place] [address]   Sodium 05/11/2019 134* 135 - 145 mmol/L Final   Potassium 05/11/2019 4.3  3.5 - 5.1 mmol/L Final   Chloride 05/11/2019 101  98 - 111 mmol/L Final   CO2 05/11/2019 25  22 - 32 mmol/L Final   Glucose, Bld 05/11/2019 224* 70 - 99 mg/dL Final   BUN 05/11/2019 83* 8 - 23 mg/dL Final   Creatinine, Ser 05/11/2019 1.87* 0.44 - 1.00 mg/dL Final   Calcium 05/11/2019 9.4  8.9 - 10.3 mg/dL Final   Total Protein 05/11/2019 7.6  6.5 - 8.1 g/dL Final   Albumin 05/11/2019 3.0* 3.5 - 5.0 g/dL Final   AST 05/11/2019 20  15 - 41 U/L Final   ALT 05/11/2019 13  0 - 44 U/L Final   Alkaline Phosphatase 05/11/2019 71  38 - 126 U/L Final   Total Bilirubin 05/11/2019 0.6  0.3 - 1.2 mg/dL Final   GFR calc non Af Amer 05/11/2019 24* >60 mL/min Final   GFR calc Af Amer 05/11/2019 28* >60 mL/min Final   Anion gap 05/11/2019 8  5 - 15 Final   Performed at [facility], [address], [place], [place] [address]   WBC 05/11/2019 9.8  4.0 - 10.5 K/uL Final   RBC 05/11/2019 3.20* 3.87 - 5.11 MIL/uL Final   Hemoglobin 05/11/2019 10.3* 12.0 - 15.0 g/dL Final   HCT 05/11/2019 33.3* 36.0 - 46.0 % Final   MCV 05/11/2019 104.1* 80.0 - 100.0 fL Final   MCH 05/11/2019 32.2  26.0 - 34.0 pg Final   MCHC 05/11/2019 30.9  30.0 - 36.0 g/dL Final   RDW 05/11/2019 15.0  11.5 - 15.5 % Final   Platelets 05/11/2019 153  150 - 400 K/uL Final   nRBC 05/11/2019 0.0  0.0 - 0.2 % Final   Neutrophils Relative %  05/11/2019 52  % Final   Neutro Abs 05/11/2019 5.1  1.7 - 7.7 K/uL Final   Lymphocytes Relative 05/11/2019 32  % Final   Lymphs Abs  05/11/2019 3.2  0.7 - 4.0 K/uL Final   Monocytes Relative 05/11/2019 9  % Final   Monocytes Absolute 05/11/2019 0.9  0.1 - 1.0 K/uL Final   Eosinophils Relative 05/11/2019 6  % Final   Eosinophils Absolute 05/11/2019 0.6* 0.0 - 0.5 K/uL Final   Basophils Relative 05/11/2019 1  % Final   Basophils Absolute 05/11/2019 0.1  0.0 - 0.1 K/uL Final   Immature Granulocytes 05/11/2019 0  % Final   Abs Immature Granulocytes 05/11/2019 0.03  0.00 - 0.07 K/uL Final   Performed at [facility], [address], [place], [place] [address]    Assessment:  [name] [name] is a 85 y.o. female with a progressive macrocytic anemia over the past 7 months.  Labs on 12/05/2018 revealed a hematocrit 35.4, hemoglobin 10.3, MCV 104, platelets 165,000, WBC 11,100.  Cr was 1.2, ferritin 57, iron saturation 15% , TIBC 319, B12 2787, and folate > 45.  Work-up on 12/16/2018 revealed a hematocrit of 35.7, hemoglobin 11.3, MCV 105.0, platelets 161,000, WBC 11,400 with an ANC of 6600.  Eosinophil count was 700 (0-500).  Retic was 2%.  LFTs revealed a protein of 8.2 and an albumen of 3.4.  SPEP revealed a M-spike of 0.4 gm/dL.  Kappa free light chains were 94.9, lambda free light chains were 54 with a ratio of 1.76 (0.26-1.65).  TSH was 0.553 (normal).  Free T4 was 1.24.  She has an IgM monoclonal gammopathy.  Work-up on 12/22/2018 revealed a 0.5 gm/dL IgM monoclonal protein with kappa light chain specificity.  IgG was 1693, IgA 352, and IgM 435 (26-217).  24 hour UPEP was normal.  She declines further work-up.  M-spike has been followed (gm/dL):  0.4 on 12/16/2018, 0.5 on 12/22/2018, and 0.4 on 05/11/2019.  She has chronic renal disease.  Creatinine has ranged between 1.2 - 1.8 in the past year.  Last colonoscopywas 12/23/2004. She declines future colonoscopies. Stool was recently guaiac negative.  Symptomatically, she feels "alright".  Weight fluctuates secondary to fluid weight. Exam reveals  no adenopathy or hepatosplenomegaly.  Plan: 1.   Labs today:  CBC with diff, CMP, SPEP, IgM, LDH, beta2-microglobulin, uric acid. 2.   Macrocytic anemia             Hematocrit 33.3.  Hemoglobin 10.3.  MCV 104.1.    Normal studies include B12, folate, TSH and LFTs.    Etiology felt most likely secondary to a myelodysplastic syndrome (MDS).              Readdress plan for bone marrow if hemoglobin less than 10.     Anticipate use of Retacrit prevent transfusion. 3.   IgM monoclonal gammopathy             M spike is 0.4 gm/dL (stable).             Differential diagnosis includes: MGUS, Waldenstrom's macroglobulinemia, IgM multiple myeloma (rare).             MGUS has IgM spike < 3, < 10% plasma cells in marrow, no B symptoms, no anemia, adenopathy, hepatosplenomegaly.              Per patient report, weight loss is due to fluid   Anemia is felt likely due to MDS.  Patient remains extremely hesitant about further work-up (bone marrow and imaging studies).             Continue surveillance. 4.   Eosinophilia             Absolute eosinophil count 600 (0-500).             Etiology felt likely secondary to current allergy symptoms 5.   RTC in 3 months for MD assessment and labs (CBC with diff, CMP, SPEP, IgM, LDH).   I discussed the assessment and treatment plan with the patient.  The patient was provided an opportunity to ask questions and all were answered.  The patient agreed with the plan and demonstrated an understanding of the instructions.  The patient was advised to call back if the symptoms worsen or if the condition fails to improve as anticipated.  I provided 21 minutes of face-to-face time during this this encounter and > 50% was spent counseling as documented under my assessment and plan.    Lequita Asal, MD, PhD    05/11/2019, 3:53 PM  I, Selena Batten, am acting as scribe for [employer]. Mike Gip, MD, PhD.  I, Melissa C. Mike Gip, MD, have reviewed the above  documentation for accuracy and completeness, and I agree with the above.

## 2019-05-11 ENCOUNTER — Encounter: Payer: Self-pay | Admitting: Hematology and Oncology

## 2019-05-11 ENCOUNTER — Inpatient Hospital Stay (HOSPITAL_BASED_OUTPATIENT_CLINIC_OR_DEPARTMENT_OTHER): Payer: Medicare HMO | Admitting: Hematology and Oncology

## 2019-05-11 ENCOUNTER — Inpatient Hospital Stay: Payer: Medicare HMO | Attending: Hematology and Oncology

## 2019-05-11 ENCOUNTER — Telehealth: Payer: Self-pay

## 2019-05-11 VITALS — BP 137/74 | HR 63 | Temp 98.1°F | Resp 18 | Ht 62.0 in | Wt 148.3 lb

## 2019-05-11 DIAGNOSIS — D649 Anemia, unspecified: Secondary | ICD-10-CM

## 2019-05-11 DIAGNOSIS — D472 Monoclonal gammopathy: Secondary | ICD-10-CM

## 2019-05-11 DIAGNOSIS — D539 Nutritional anemia, unspecified: Secondary | ICD-10-CM

## 2019-05-11 DIAGNOSIS — R32 Unspecified urinary incontinence: Secondary | ICD-10-CM

## 2019-05-11 DIAGNOSIS — D721 Eosinophilia: Secondary | ICD-10-CM | POA: Diagnosis not present

## 2019-05-11 DIAGNOSIS — Z7982 Long term (current) use of aspirin: Secondary | ICD-10-CM | POA: Insufficient documentation

## 2019-05-11 DIAGNOSIS — Z803 Family history of malignant neoplasm of breast: Secondary | ICD-10-CM

## 2019-05-11 DIAGNOSIS — Z794 Long term (current) use of insulin: Secondary | ICD-10-CM | POA: Diagnosis not present

## 2019-05-11 DIAGNOSIS — R682 Dry mouth, unspecified: Secondary | ICD-10-CM | POA: Diagnosis not present

## 2019-05-11 DIAGNOSIS — Z79899 Other long term (current) drug therapy: Secondary | ICD-10-CM | POA: Diagnosis not present

## 2019-05-11 LAB — COMPREHENSIVE METABOLIC PANEL
ALT: 13 U/L (ref 0–44)
AST: 20 U/L (ref 15–41)
Albumin: 3 g/dL — ABNORMAL LOW (ref 3.5–5.0)
Alkaline Phosphatase: 71 U/L (ref 38–126)
Anion gap: 8 (ref 5–15)
BUN: 83 mg/dL — ABNORMAL HIGH (ref 8–23)
CO2: 25 mmol/L (ref 22–32)
Calcium: 9.4 mg/dL (ref 8.9–10.3)
Chloride: 101 mmol/L (ref 98–111)
Creatinine, Ser: 1.87 mg/dL — ABNORMAL HIGH (ref 0.44–1.00)
GFR calc Af Amer: 28 mL/min — ABNORMAL LOW (ref >60)
GFR calc non Af Amer: 24 mL/min — ABNORMAL LOW (ref >60)
Glucose, Bld: 224 mg/dL — ABNORMAL HIGH (ref 70–99)
Potassium: 4.3 mmol/L (ref 3.5–5.1)
Sodium: 134 mmol/L — ABNORMAL LOW (ref 135–145)
Total Bilirubin: 0.6 mg/dL (ref 0.3–1.2)
Total Protein: 7.6 g/dL (ref 6.5–8.1)

## 2019-05-11 LAB — IRON AND TIBC
Iron: 45 ug/dL (ref 28–170)
Saturation Ratios: 15 % (ref 10.4–31.8)
TIBC: 311 ug/dL (ref 250–450)
UIBC: 266 ug/dL

## 2019-05-11 LAB — CBC WITH DIFFERENTIAL/PLATELET
Abs Immature Granulocytes: 0.03 10*3/uL (ref 0.00–0.07)
Basophils Absolute: 0.1 10*3/uL (ref 0.0–0.1)
Basophils Relative: 1 %
Eosinophils Absolute: 0.6 10*3/uL — ABNORMAL HIGH (ref 0.0–0.5)
Eosinophils Relative: 6 %
HCT: 33.3 % — ABNORMAL LOW (ref 36.0–46.0)
Hemoglobin: 10.3 g/dL — ABNORMAL LOW (ref 12.0–15.0)
Immature Granulocytes: 0 %
Lymphocytes Relative: 32 %
Lymphs Abs: 3.2 10*3/uL (ref 0.7–4.0)
MCH: 32.2 pg (ref 26.0–34.0)
MCHC: 30.9 g/dL (ref 30.0–36.0)
MCV: 104.1 fL — ABNORMAL HIGH (ref 80.0–100.0)
Monocytes Absolute: 0.9 10*3/uL (ref 0.1–1.0)
Monocytes Relative: 9 %
Neutro Abs: 5.1 10*3/uL (ref 1.7–7.7)
Neutrophils Relative %: 52 %
Platelets: 153 10*3/uL (ref 150–400)
RBC: 3.2 MIL/uL — ABNORMAL LOW (ref 3.87–5.11)
RDW: 15 % (ref 11.5–15.5)
WBC: 9.8 10*3/uL (ref 4.0–10.5)
nRBC: 0 % (ref 0.0–0.2)

## 2019-05-11 LAB — FERRITIN: Ferritin: 37 ng/mL (ref 11–307)

## 2019-05-11 LAB — URIC ACID: Uric Acid, Serum: 3.7 mg/dL (ref 2.5–7.1)

## 2019-05-11 LAB — LACTATE DEHYDROGENASE: LDH: 158 U/L (ref 98–192)

## 2019-05-11 NOTE — Telephone Encounter (Signed)
-----   Message from Lequita Asal, MD sent at 05/11/2019  4:49 PM EDT ----- Regarding: Please send BMP to PCP  BUN and creatinine are elevated.  Patient on Lasix.  Per patient, similar to recent labs in her office.  M ----- Message ----- From: Buel Ream, Lab [facility] [place] Sent: 05/11/2019   3:30 PM EDT To: Lequita Asal, MD

## 2019-05-11 NOTE — Telephone Encounter (Signed)
Labs forwarded to Dr. Astrid Divine (PCP) for review per Dr. Kem Parkinson request.

## 2019-05-11 NOTE — Progress Notes (Signed)
No new changes noted today 

## 2019-05-12 LAB — PROTEIN ELECTROPHORESIS, SERUM
A/G Ratio: 0.7 (ref 0.7–1.7)
Albumin ELP: 2.9 g/dL (ref 2.9–4.4)
Alpha-1-Globulin: 0.2 g/dL (ref 0.0–0.4)
Alpha-2-Globulin: 1 g/dL (ref 0.4–1.0)
Beta Globulin: 0.9 g/dL (ref 0.7–1.3)
Gamma Globulin: 1.9 g/dL — ABNORMAL HIGH (ref 0.4–1.8)
Globulin, Total: 4 g/dL — ABNORMAL HIGH (ref 2.2–3.9)
M-Spike, %: 0.4 g/dL — ABNORMAL HIGH
Total Protein ELP: 6.9 g/dL (ref 6.0–8.5)

## 2019-05-12 LAB — BETA 2 MICROGLOBULIN, SERUM: Beta-2 Microglobulin: 7.5 mg/L — ABNORMAL HIGH (ref 0.6–2.4)

## 2019-05-12 LAB — IGM: IgM (Immunoglobulin M), Srm: 411 mg/dL — ABNORMAL HIGH (ref 26–217)

## 2019-08-08 ENCOUNTER — Other Ambulatory Visit: Payer: Self-pay | Admitting: Family Medicine

## 2019-08-08 DIAGNOSIS — Z780 Asymptomatic menopausal state: Secondary | ICD-10-CM

## 2019-08-11 ENCOUNTER — Encounter: Payer: Self-pay | Admitting: Hematology and Oncology

## 2019-08-11 NOTE — Progress Notes (Signed)
No new changes noted. The patient name and DOB has been verified by phone.

## 2019-08-14 ENCOUNTER — Encounter: Payer: Self-pay | Admitting: Hematology and Oncology

## 2019-08-14 ENCOUNTER — Other Ambulatory Visit: Payer: Self-pay

## 2019-08-14 ENCOUNTER — Inpatient Hospital Stay: Payer: Medicare HMO | Attending: Hematology and Oncology | Admitting: Hematology and Oncology

## 2019-08-14 ENCOUNTER — Inpatient Hospital Stay: Payer: Medicare HMO

## 2019-08-14 VITALS — BP 155/75 | HR 65 | Temp 96.3°F | Resp 18 | Ht 62.0 in | Wt 150.6 lb

## 2019-08-14 DIAGNOSIS — E119 Type 2 diabetes mellitus without complications: Secondary | ICD-10-CM | POA: Diagnosis not present

## 2019-08-14 DIAGNOSIS — I10 Essential (primary) hypertension: Secondary | ICD-10-CM | POA: Diagnosis not present

## 2019-08-14 DIAGNOSIS — N189 Chronic kidney disease, unspecified: Secondary | ICD-10-CM | POA: Insufficient documentation

## 2019-08-14 DIAGNOSIS — Z803 Family history of malignant neoplasm of breast: Secondary | ICD-10-CM | POA: Insufficient documentation

## 2019-08-14 DIAGNOSIS — E079 Disorder of thyroid, unspecified: Secondary | ICD-10-CM | POA: Diagnosis not present

## 2019-08-14 DIAGNOSIS — Z794 Long term (current) use of insulin: Secondary | ICD-10-CM | POA: Insufficient documentation

## 2019-08-14 DIAGNOSIS — Z79899 Other long term (current) drug therapy: Secondary | ICD-10-CM | POA: Diagnosis not present

## 2019-08-14 DIAGNOSIS — D539 Nutritional anemia, unspecified: Secondary | ICD-10-CM | POA: Diagnosis not present

## 2019-08-14 DIAGNOSIS — Z7982 Long term (current) use of aspirin: Secondary | ICD-10-CM | POA: Diagnosis not present

## 2019-08-14 DIAGNOSIS — Z87891 Personal history of nicotine dependence: Secondary | ICD-10-CM | POA: Insufficient documentation

## 2019-08-14 DIAGNOSIS — Z7984 Long term (current) use of oral hypoglycemic drugs: Secondary | ICD-10-CM | POA: Insufficient documentation

## 2019-08-14 DIAGNOSIS — D472 Monoclonal gammopathy: Secondary | ICD-10-CM | POA: Diagnosis present

## 2019-08-14 LAB — CBC WITH DIFFERENTIAL/PLATELET
Abs Immature Granulocytes: 0.04 10*3/uL (ref 0.00–0.07)
Basophils Absolute: 0.1 10*3/uL (ref 0.0–0.1)
Basophils Relative: 1 %
Eosinophils Absolute: 0.5 10*3/uL (ref 0.0–0.5)
Eosinophils Relative: 5 %
HCT: 32.4 % — ABNORMAL LOW (ref 36.0–46.0)
Hemoglobin: 10.2 g/dL — ABNORMAL LOW (ref 12.0–15.0)
Immature Granulocytes: 0 %
Lymphocytes Relative: 37 %
Lymphs Abs: 4.1 10*3/uL — ABNORMAL HIGH (ref 0.7–4.0)
MCH: 32 pg (ref 26.0–34.0)
MCHC: 31.5 g/dL (ref 30.0–36.0)
MCV: 101.6 fL — ABNORMAL HIGH (ref 80.0–100.0)
Monocytes Absolute: 0.9 10*3/uL (ref 0.1–1.0)
Monocytes Relative: 8 %
Neutro Abs: 5.3 10*3/uL (ref 1.7–7.7)
Neutrophils Relative %: 49 %
Platelets: 185 10*3/uL (ref 150–400)
RBC: 3.19 MIL/uL — ABNORMAL LOW (ref 3.87–5.11)
RDW: 15.8 % — ABNORMAL HIGH (ref 11.5–15.5)
WBC: 10.9 10*3/uL — ABNORMAL HIGH (ref 4.0–10.5)
nRBC: 0 % (ref 0.0–0.2)

## 2019-08-14 LAB — COMPREHENSIVE METABOLIC PANEL
ALT: 6 U/L (ref 0–44)
AST: 23 U/L (ref 15–41)
Albumin: 3.1 g/dL — ABNORMAL LOW (ref 3.5–5.0)
Alkaline Phosphatase: 69 U/L (ref 38–126)
Anion gap: 5 (ref 5–15)
BUN: 50 mg/dL — ABNORMAL HIGH (ref 8–23)
CO2: 30 mmol/L (ref 22–32)
Calcium: 9.5 mg/dL (ref 8.9–10.3)
Chloride: 99 mmol/L (ref 98–111)
Creatinine, Ser: 1.45 mg/dL — ABNORMAL HIGH (ref 0.44–1.00)
GFR calc Af Amer: 37 mL/min — ABNORMAL LOW (ref >60)
GFR calc non Af Amer: 32 mL/min — ABNORMAL LOW (ref >60)
Glucose, Bld: 188 mg/dL — ABNORMAL HIGH (ref 70–99)
Potassium: 5.2 mmol/L — ABNORMAL HIGH (ref 3.5–5.1)
Sodium: 134 mmol/L — ABNORMAL LOW (ref 135–145)
Total Bilirubin: 0.4 mg/dL (ref 0.3–1.2)
Total Protein: 8.5 g/dL — ABNORMAL HIGH (ref 6.5–8.1)

## 2019-08-14 LAB — LACTATE DEHYDROGENASE: LDH: 173 U/L (ref 98–192)

## 2019-08-14 NOTE — Progress Notes (Signed)
[name] [name] ([place])  [address], [address] [place], [place] [address] Phone: [phone]  Fax: [phone]   Clinic Day:  08/14/2019  Referring physician: Gayland Curry, MD  Chief Complaint: [name] [name] is a 85 y.o. female with an IgM monoclonal gammopathy and a macrocytic anemia who is seen for 4 month assessment.   HPI: The patient was last seen in the hematology clinic on 05/11/2019. At that time, she felt "all right".  Weight fluctuated secondary to fluid weight. Exam revealed no adenopathy or hepatosplenomegaly. Hematocrit was 33.3, hemoglobin 10.3, MCV 104.1, platelets 153,000, WBC 9800 with an ANC of 5100.  Creatinine was 1.87. Beta-2 microglobulin was 7.5.  M-spike was 0.4 gm/dL.  IgM was 411.  During the interim, she has been happy. She denies infections. She occasionally has some burning with urination, which resolves with increased fluid intake.    Past Medical History:  Diagnosis Date  . Diabetes mellitus without complication ([place])   . Hypertension   . Thyroid disease     Past Surgical History:  Procedure Laterality Date  . ABDOMINAL HYSTERECTOMY    . BREAST BIOPSY Right 20+yrs ago   negative    Family History  Problem Relation Age of Onset  . Breast cancer Mother 47    Social History:  reports that she has never smoked. She has never used smokeless tobacco. She reports that she does not drink alcohol. No history on file for drug. Patient is a former smoker for over 30 years. She does not drink.The patient lives in [place].  She is widowed. Her adult son, [name] Saxon "[name]," lives with her. Patient denies known exposures to radiation on toxins. The patient is alone today.  Her son, [name] [name], is on the phone.  Allergies:  Allergies  Allergen Reactions  . Ace Inhibitors Cough and Other (See Comments)     hyperkalemia  hyperkalemia   . Codeine Nausea Only  . Sulfonamide Derivatives Other (See Comments)    Reaction: unknown     Current Medications: Current Outpatient Medications  Medication Sig Dispense Refill  . allopurinol (ZYLOPRIM) 300 MG tablet Take 300 mg by mouth daily.    Marland Kitchen amLODipine (NORVASC) 5 MG tablet Take 1 tablet (5 mg total) by mouth daily. 30 tablet 0  . aspirin EC 81 MG tablet Take 81 mg by mouth daily.    . cholecalciferol (VITAMIN D) 1000 units tablet Take 1,000 Units by mouth daily at 12 noon.    . cloNIDine (CATAPRES) 0.1 MG tablet Take 0.1 mg by mouth 3 (three) times daily.     . furosemide (LASIX) 40 MG tablet Take 1 tablet (40 mg total) by mouth daily. 30 tablet 2  . gabapentin (NEURONTIN) 100 MG capsule Take 100 mg by mouth 3 (three) times daily.    Marland Kitchen glipiZIDE (GLUCOTROL) 10 MG tablet Take 10 mg by mouth 3 (three) times daily.    . insulin detemir (LEVEMIR) 100 UNIT/ML injection Inject 12-14 Units into the skin at bedtime.     . insulin lispro (HUMALOG) 100 UNIT/ML injection Inject 2 Units into the skin daily as needed. Use 2 units for every 50 glucometer points over 200 with your after meal glucose checks    . levothyroxine (SYNTHROID, LEVOTHROID) 100 MCG tablet Take 100 mcg by mouth daily.    . metoprolol succinate (TOPROL-XL) 100 MG 24 hr tablet Take 1 tablet (100 mg total) by mouth 2 (two) times daily. 60 tablet 2  . Multiple Vitamin (MULTIVITAMIN WITH MINERALS) TABS  tablet Take 1 tablet by mouth daily at 12 noon.    Marland Kitchen omega-3 acid ethyl esters (LOVAZA) 1 g capsule Take 1 g by mouth daily at 12 noon.    . pravastatin (PRAVACHOL) 80 MG tablet Take 80 mg by mouth at bedtime.    . ranitidine (ZANTAC) 150 MG tablet Take 150 mg by mouth at bedtime.    Marland Kitchen rOPINIRole (REQUIP) 0.25 MG tablet Take 0.25 mg by mouth 3 (three) times daily.     Marland Kitchen rOPINIRole (REQUIP) 0.5 MG tablet One to Two pills by mouth at bedtime.    . triamcinolone cream (KENALOG) 0.1 % Apply 1 application topically 2 (two) times daily.    . vitamin B-12 (CYANOCOBALAMIN) 1000 MCG tablet Take 1,000 mcg by mouth daily at 12  noon.     No current facility-administered medications for this visit.     Review of Systems  Constitutional: Negative for chills, fever, malaise/fatigue and weight loss (up 2 pounds).       "Couldn't be happier". Weight fluctuates secondary to fluid.  HENT: Negative for ear pain, hearing loss, nosebleeds, sinus pain and sore throat.        Runny nose secondary to allergies (golden rods). Dry mouth.   Eyes: Negative for blurred vision, double vision and photophobia.       Poor vision (no change).  Respiratory: Positive for cough ("little") and shortness of breath (chronic). Negative for hemoptysis.        Chronic oxygen 2 liters/min via [place].  Cardiovascular: Negative.  Negative for chest pain, palpitations and leg swelling.  Gastrointestinal: Negative.  Negative for abdominal pain, blood in stool, constipation, diarrhea, nausea and vomiting.  Genitourinary: Positive for dysuria (occasional; resolves with increased fluid intake). Negative for hematuria and urgency.       Incontinence on standing.  Musculoskeletal: Positive for falls (fell when chair slid out from underneath her;). Negative for back pain, joint pain, myalgias and neck pain.       Aches and pains (chronic).  Skin: Positive for rash (psoriasis).  Neurological: Positive for tremors. Negative for dizziness, tingling, sensory change, weakness and headaches.  Endo/Heme/Allergies: Does not bruise/bleed easily.       Diabetes. Thyroid disease on Synthroid.  Psychiatric/Behavioral: Negative.  Negative for depression and memory loss. The patient is not nervous/anxious and does not have insomnia.   All other systems reviewed and are negative.  Performance status (ECOG): 2  Vitals Blood pressure (!) 182/76, pulse 70, temperature (!) 96.3 F (35.7 C), temperature source Tympanic, resp. rate 18, height '5\' 2"'  (1.575 m), weight 150 lb 9.2 oz (68.3 kg), SpO2 96 %.   Physical Exam  Constitutional: She is oriented to person, place, and  time. Face mask in place.  Elderly woman sitting comfortably in a wheelchair in no acute distress.  She is examined in her wheelchair.  HENT:  Head: Normocephalic and atraumatic.  Mouth/Throat: Mucous membranes are dry. No oropharyngeal exudate.  Short gray hair. Dry mouth. [place] in place.  Eyes: Pupils are equal, round, and reactive to light. Conjunctivae and EOM are normal. No scleral icterus.  Glasses. Blue eyes.  Neck: Normal range of motion. Neck supple. No JVD present.  Cardiovascular: Normal rate, regular rhythm and normal heart sounds.  No murmur heard. Pulmonary/Chest: Effort normal. She has no wheezes. She exhibits tenderness (underneath clavicle due to fall).  Dry crackles at the bases.  Abdominal: Soft. Bowel sounds are normal. She exhibits no mass. There is no abdominal tenderness. There is  no rebound and no guarding.  Musculoskeletal: Normal range of motion.        General: No tenderness or edema.     Comments: Chronic lower extremity changes.  Lymphadenopathy:    She has no cervical adenopathy.    She has no axillary adenopathy.       Right: No supraclavicular adenopathy present.       Left: No supraclavicular adenopathy present.  Neurological: She is alert and oriented to person, place, and time. She has normal reflexes.  Skin: Skin is warm and dry. No rash noted. She is not diaphoretic. No erythema. No pallor.  Ecchymosis on her upper chest/sternum.  Psychiatric: She has a normal mood and affect. Her behavior is normal. Thought content normal.  Nursing note and vitals reviewed.   No visits with results within 3 Day(s) from this visit.  Latest known visit with results is:  Appointment on 05/11/2019  Component Date Value Ref Range Status  . Beta-2 Microglobulin 05/11/2019 7.5* 0.6 - 2.4 mg/L Final   Comment: (NOTE) Siemens Immulite 2000 Immunochemiluminometric assay (ICMA) Values obtained with different assay methods or kits cannot be used interchangeably. Results  cannot be interpreted as absolute evidence of the presence or absence of malignant disease. Performed At: [facility] [place], [place] [id number] Rush Farmer MD [id number]   . LDH 05/11/2019 158  98 - 192 U/L Final   Performed at [name], [address], [place], [place] [address]  . Uric Acid, Serum 05/11/2019 3.7  2.5 - 7.1 mg/dL Final   Performed at [facility], [address] [place], [place] [address]  . IgM (Immunoglobulin M), Srm 05/11/2019 411* 26 - 217 mg/dL Final   Comment: (NOTE) Performed At: [facility] [place], [place] [id number] Rush Farmer MD [id number]   . Total Protein ELP 05/11/2019 6.9  6.0 - 8.5 g/dL Final  . Albumin ELP 05/11/2019 2.9  2.9 - 4.4 g/dL Final  . Alpha-1-Globulin 05/11/2019 0.2  0.0 - 0.4 g/dL Final  . Alpha-2-Globulin 05/11/2019 1.0  0.4 - 1.0 g/dL Final  . Beta Globulin 05/11/2019 0.9  0.7 - 1.3 g/dL Final  . Gamma Globulin 05/11/2019 1.9* 0.4 - 1.8 g/dL Final  . M-Spike, % 05/11/2019 0.4* Not Observed g/dL Final  . SPE Interp. 05/11/2019 Comment   Final   Comment: (NOTE) The SPE pattern demonstrates a single peak (M-spike) in the gamma region which may represent monoclonal protein. This peak may also be caused by circulating immune complexes, cryoglobulins, C-reactive protein, fibrinogen or hemolysis.  If clinically indicated, the presence of a monoclonal gammopathy may be confirmed by immuno- fixation, as well as an evaluation of the urine for the presence of Bence-Jones protein. Performed At: [name] [place], [place] [id number] Rush Farmer MD [id number]   . Comment 05/11/2019 Comment   Final   Comment: (NOTE) Protein electrophoresis scan will follow via computer, mail, or courier delivery.   . Globulin, Total 05/11/2019 4.0* 2.2 - 3.9 g/dL Corrected  . A/G Ratio 05/11/2019 0.7  0.7 - 1.7 Corrected  . Sodium  05/11/2019 134* 135 - 145 mmol/L Final  . Potassium 05/11/2019 4.3  3.5 - 5.1 mmol/L Final  . Chloride 05/11/2019 101  98 - 111 mmol/L Final  . CO2 05/11/2019 25  22 - 32 mmol/L Final  . Glucose, Bld 05/11/2019 224* 70 - 99 mg/dL Final  . BUN 05/11/2019 83* 8 - 23 mg/dL Final  .  Creatinine, Ser 05/11/2019 1.87* 0.44 - 1.00 mg/dL Final  . Calcium 05/11/2019 9.4  8.9 - 10.3 mg/dL Final  . Total Protein 05/11/2019 7.6  6.5 - 8.1 g/dL Final  . Albumin 05/11/2019 3.0* 3.5 - 5.0 g/dL Final  . AST 05/11/2019 20  15 - 41 U/L Final  . ALT 05/11/2019 13  0 - 44 U/L Final  . Alkaline Phosphatase 05/11/2019 71  38 - 126 U/L Final  . Total Bilirubin 05/11/2019 0.6  0.3 - 1.2 mg/dL Final  . GFR calc non Af Amer 05/11/2019 24* >60 mL/min Final  . GFR calc Af Amer 05/11/2019 28* >60 mL/min Final  . Anion gap 05/11/2019 8  5 - 15 Final   Performed at [name], [address], [place], [place] [address]  . WBC 05/11/2019 9.8  4.0 - 10.5 K/uL Final  . RBC 05/11/2019 3.20* 3.87 - 5.11 MIL/uL Final  . Hemoglobin 05/11/2019 10.3* 12.0 - 15.0 g/dL Final  . HCT 05/11/2019 33.3* 36.0 - 46.0 % Final  . MCV 05/11/2019 104.1* 80.0 - 100.0 fL Final  . MCH 05/11/2019 32.2  26.0 - 34.0 pg Final  . MCHC 05/11/2019 30.9  30.0 - 36.0 g/dL Final  . RDW 05/11/2019 15.0  11.5 - 15.5 % Final  . Platelets 05/11/2019 153  150 - 400 K/uL Final  . nRBC 05/11/2019 0.0  0.0 - 0.2 % Final  . Neutrophils Relative % 05/11/2019 52  % Final  . Neutro Abs 05/11/2019 5.1  1.7 - 7.7 K/uL Final  . Lymphocytes Relative 05/11/2019 32  % Final  . Lymphs Abs 05/11/2019 3.2  0.7 - 4.0 K/uL Final  . Monocytes Relative 05/11/2019 9  % Final  . Monocytes Absolute 05/11/2019 0.9  0.1 - 1.0 K/uL Final  . Eosinophils Relative 05/11/2019 6  % Final  . Eosinophils Absolute 05/11/2019 0.6* 0.0 - 0.5 K/uL Final  . Basophils Relative 05/11/2019 1  % Final  . Basophils Absolute 05/11/2019 0.1  0.0 - 0.1 K/uL Final  . Immature  Granulocytes 05/11/2019 0  % Final  . Abs Immature Granulocytes 05/11/2019 0.03  0.00 - 0.07 K/uL Final   Performed at [facility], [address] [place], [place] [address]  . Iron 05/11/2019 45  28 - 170 ug/dL Final  . TIBC 05/11/2019 311  250 - 450 ug/dL Final  . Saturation Ratios 05/11/2019 15  10.4 - 31.8 % Final  . UIBC 05/11/2019 266  ug/dL Final   Performed at [name], [address], [place], [place] [address]  . Ferritin 05/11/2019 37  11 - 307 ng/mL Final   Performed at [name], [address] [place], [place] [address]    Assessment:  Ernest Orr is a 85 y.o. female with a progressive macrocytic anemia over the past 7 months.  Labs on 12/05/2018 revealed a hematocrit 35.4, hemoglobin 10.3, MCV 104, platelets 165,000, WBC 11,100.  Cr was 1.2, ferritin 57, iron saturation 15% , TIBC 319, B12 2787, and folate > 45.  Work-up on 12/16/2018 revealed a hematocrit of 35.7, hemoglobin 11.3, MCV 105.0, platelets 161,000, WBC 11,400 with an ANC of 6600.  Eosinophil count was 700 (0-500).  Retic was 2%.  LFTs revealed a protein of 8.2 and an albumen of 3.4.  SPEP revealed a M-spike of 0.4 gm/dL.  Kappa free light chains were 94.9, lambda free light chains were 54 with a ratio of 1.76 (0.26-1.65).  TSH was 0.553 (normal).  Free T4 was 1.24.  She has an IgM monoclonal gammopathy.  Work-up on 12/22/2018 revealed a 0.5 gm/dL IgM monoclonal protein with kappa light chain specificity.  IgG was 1693, IgA 352, and IgM 435 (26-217).  24 hour UPEP was normal.  She declines further work-up.  M-spike has been followed (gm/dL):  0.4 on 12/16/2018, 0.5 on 12/22/2018, 0.4 on 05/11/2019, and 0.6 on 08/14/2019.  She has chronic renal disease.  Creatinine has ranged between 1.2 - 1.8 in the past year.  Last colonoscopywas 12/23/2004. She declines future colonoscopies. Stool was recently guaiac negative.  Symptomatically, she feels good.  She denies  any infections.  Exam reveals no adenopathy or hepatosplenomegaly.  Plan: 1.   Labs today:  CBC with diff, CMP, SPEP, IgM, LDH. 2.   Macrocytic anemia             Hematocrit 32.4.  Hemoglobin 10.2.  MCV 101.6.    Normal studies include B12, folate, TSH and LFTs.   B12 was 2787 on 12/05/2018 and 5834 on 02/06/2019.     Folate was > 45 on 12/05/2018.   TSH and LFTs normal on 12/16/2018.  Etiology most likely secondary to myelodysplastic syndrome (MDS).              Plan for bone marrow if hemoglobin < 10.    Anticipate use of Retacrit to prevent transfusion. 3.   IgM monoclonal gammopathy             M spike is 0.6 gm/dL today (fluctuates slightly).             Differential diagnosis includes: MGUS, Waldenstrom's macroglobulinemia, IgM multiple myeloma (rare).             MGUS has IgM spike < 3, < 10% plasma cells in marrow, no B symptoms, no anemia, adenopathy, hepatosplenomegaly.              Weight fluctuates secondary to fluid weight.   Anemia felt likely due to MDS.             Patient remains extremely hesitant about further work-up (bone marrow and imaging studies).             Continue surveillance. 4.   Eosinophilia, resolved             Absolute eosinophil count 500 (0-500).             Etiology felt likely secondary to current allergy symptoms 5.   RN to call patient/son with today's lab results. 6.   RTC in 3 months for MD assessment and labs (CBC with diff, CMP, SPEP, IgM, LDH).  I discussed the assessment and treatment plan with the patient.  The patient was provided an opportunity to ask questions and all were answered.  The patient agreed with the plan and demonstrated an understanding of the instructions.  The patient was advised to call back if the symptoms worsen or if the condition fails to improve as anticipated.   Lequita Asal, MD, PhD    08/14/2019, 3:07 PM  I, Jacqualyn Posey, am acting as a Education administrator for [employer]. Mike Gip, MD.   I, Melissa C. Mike Gip, MD,  have reviewed the above documentation for accuracy and completeness, and I agree with the above.

## 2019-08-15 LAB — PROTEIN ELECTROPHORESIS, SERUM
A/G Ratio: 0.7 (ref 0.7–1.7)
Albumin ELP: 3.2 g/dL (ref 2.9–4.4)
Alpha-1-Globulin: 0.3 g/dL (ref 0.0–0.4)
Alpha-2-Globulin: 1.1 g/dL — ABNORMAL HIGH (ref 0.4–1.0)
Beta Globulin: 1 g/dL (ref 0.7–1.3)
Gamma Globulin: 2.3 g/dL — ABNORMAL HIGH (ref 0.4–1.8)
Globulin, Total: 4.7 g/dL — ABNORMAL HIGH (ref 2.2–3.9)
M-Spike, %: 0.6 g/dL — ABNORMAL HIGH
Total Protein ELP: 7.9 g/dL (ref 6.0–8.5)

## 2019-08-15 LAB — IGM: IgM (Immunoglobulin M), Srm: 523 mg/dL — ABNORMAL HIGH (ref 26–217)

## 2019-08-16 ENCOUNTER — Telehealth: Payer: Self-pay

## 2019-08-16 NOTE — Telephone Encounter (Signed)
Left a message to inform the patient that we need her to keep all schedule appointment so we can keep a close check on her health condition.

## 2019-08-16 NOTE — Telephone Encounter (Signed)
-----   Message from Lequita Asal, MD sent at 08/16/2019 10:43 AM EDT ----- Regarding: Please call patient  Please let patient know her M-spike has increased.  Recommend ongoing follow-up as treatment may be needed in the future.  M  ----- Message ----- From: Buel Ream, [facility] [place] Sent: 08/14/2019   4:43 PM EDT To: Lequita Asal, MD

## 2019-11-13 ENCOUNTER — Other Ambulatory Visit: Payer: Medicare HMO

## 2019-11-13 ENCOUNTER — Ambulatory Visit: Payer: Medicare HMO | Admitting: Hematology and Oncology

## 2019-12-11 ENCOUNTER — Ambulatory Visit
Admission: RE | Admit: 2019-12-11 | Discharge: 2019-12-11 | Disposition: A | Discharge status: Home / Self Care | Payer: Medicare HMO | Attending: Family Medicine | Admitting: Family Medicine

## 2019-12-11 ENCOUNTER — Other Ambulatory Visit: Payer: Self-pay

## 2019-12-11 ENCOUNTER — Ambulatory Visit
Admission: RE | Admit: 2019-12-11 | Discharge: 2019-12-11 | Disposition: A | Discharge status: Home / Self Care | Payer: Medicare HMO | Source: Ambulatory Visit | Attending: Family Medicine | Admitting: Family Medicine

## 2019-12-11 ENCOUNTER — Other Ambulatory Visit: Payer: Self-pay | Admitting: Family Medicine

## 2019-12-11 DIAGNOSIS — J42 Unspecified chronic bronchitis: Secondary | ICD-10-CM | POA: Insufficient documentation

## 2020-03-11 ENCOUNTER — Other Ambulatory Visit: Payer: Self-pay | Admitting: Neurology

## 2020-03-11 DIAGNOSIS — R410 Disorientation, unspecified: Secondary | ICD-10-CM

## 2020-03-11 IMAGING — CR DG CHEST 2V
2 series · 2 of 2 positions shown · non-contrast
Comparison: 10/21/2017.  05/31/2012 chest CT 01/29/2007.

CLINICAL DATA: Intermittent cough.  COPD.

EXAM:
CHEST - 2 VIEW

[chest pa]
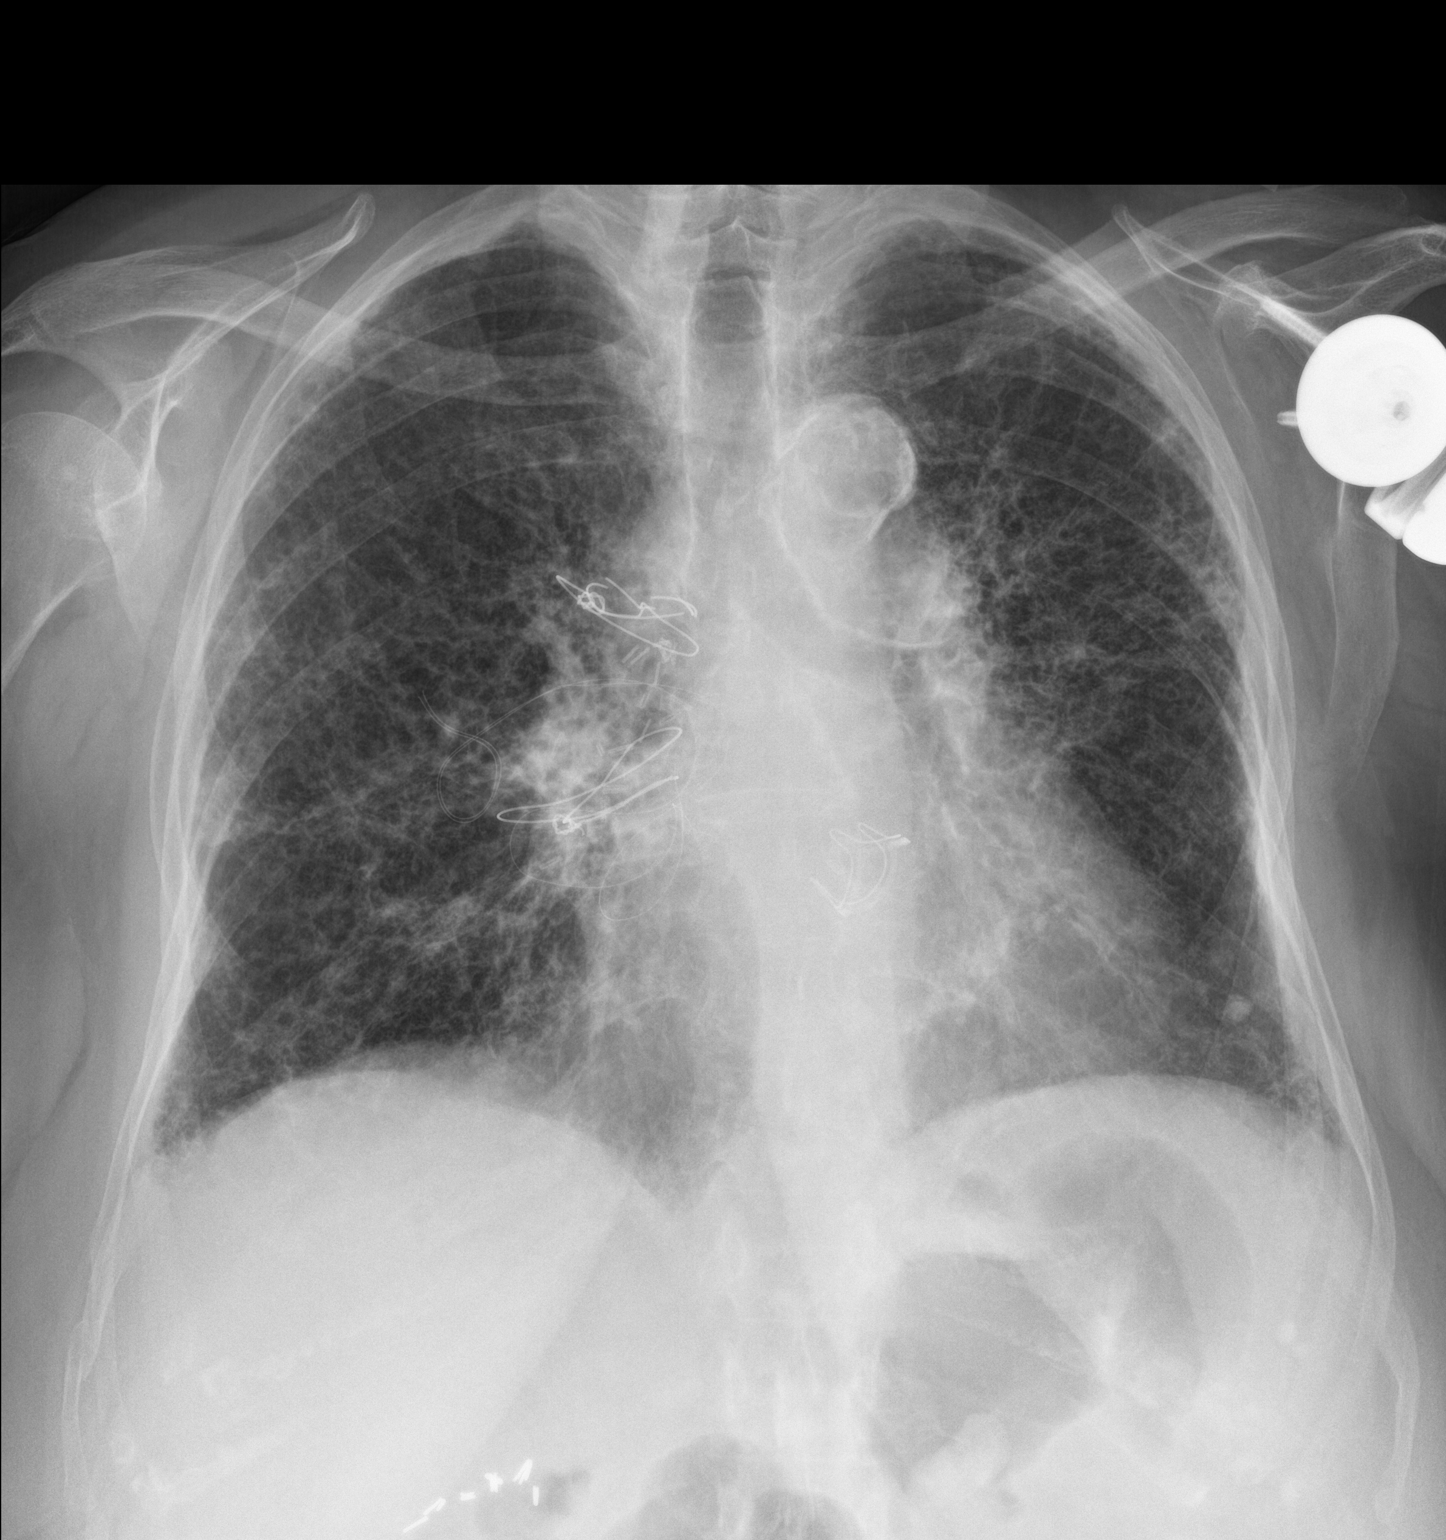

[chest lat]
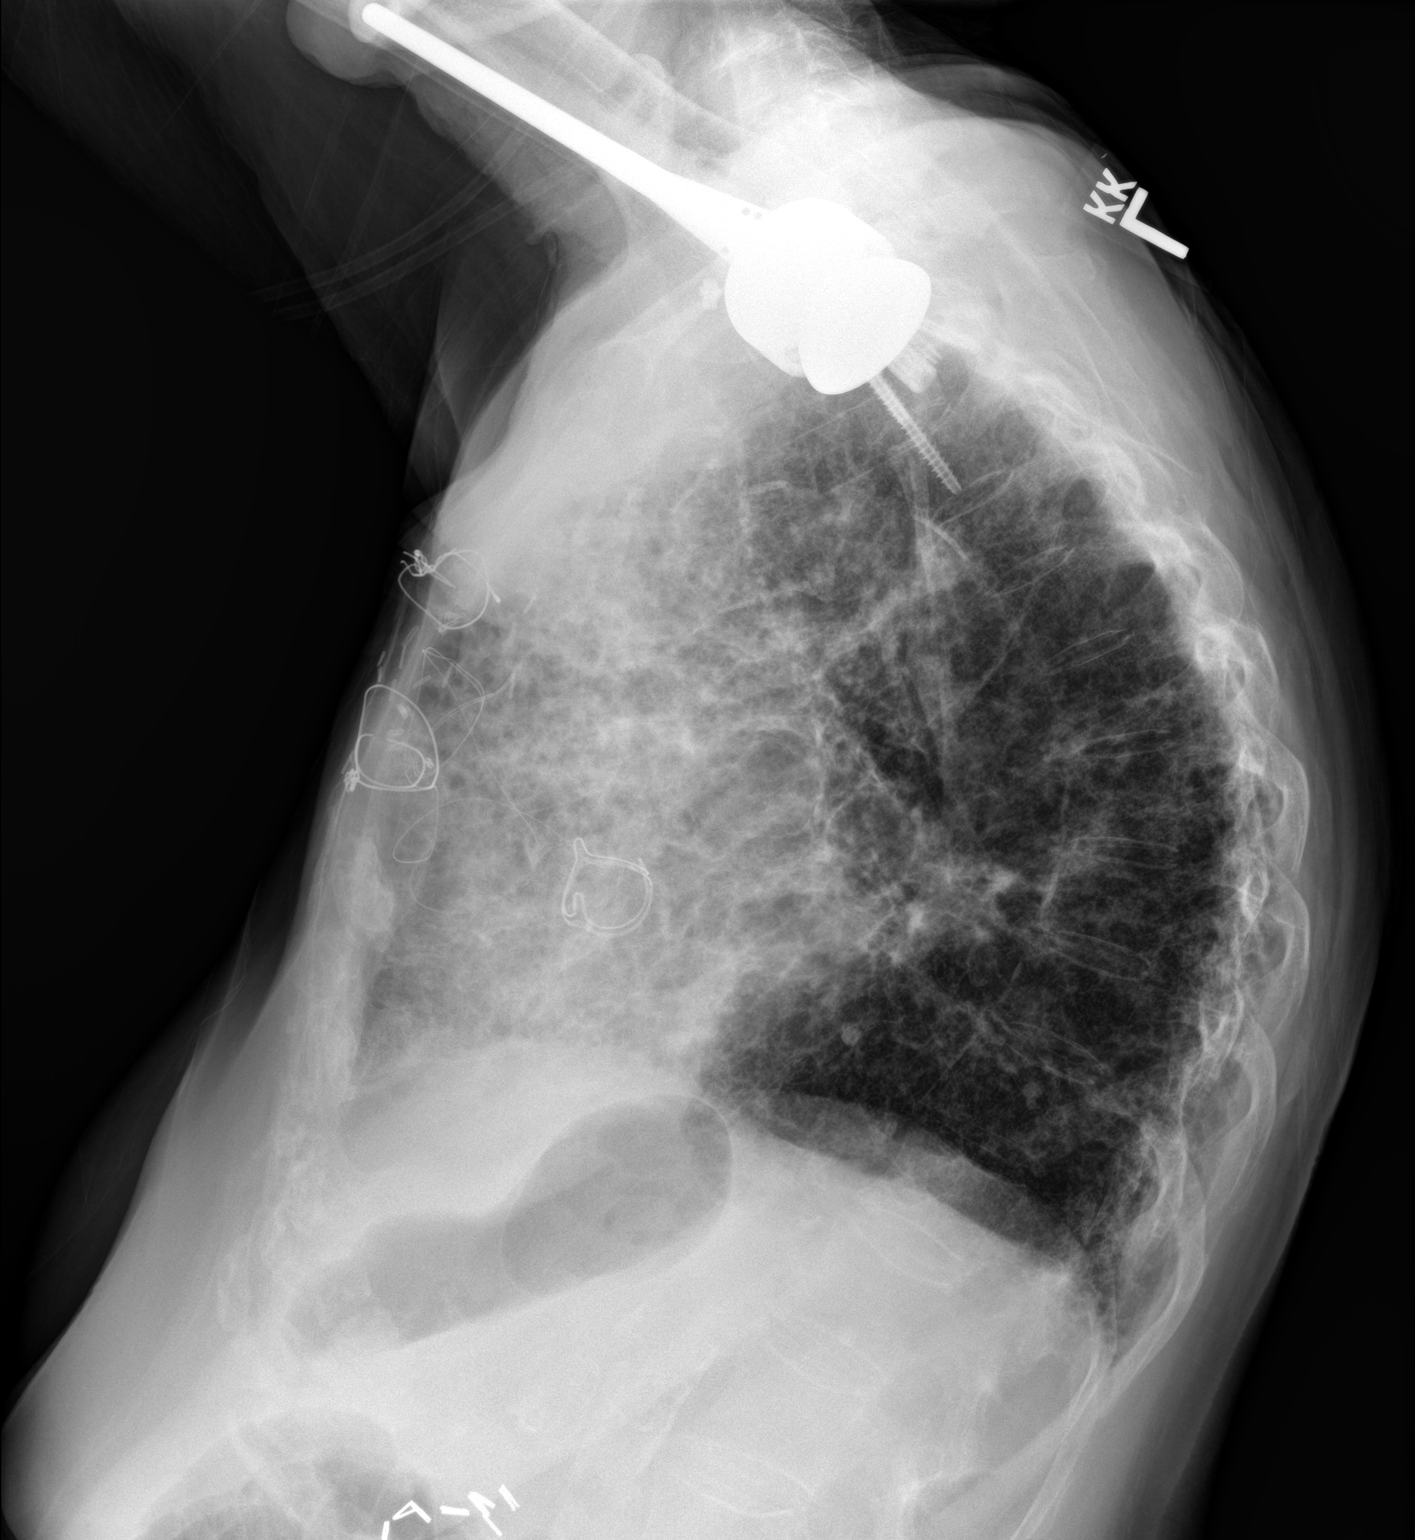

[2 of 2 positions shown; findings below may reference images not displayed]

FINDINGS: Prior cardiac valve replacement. Stable wiring noted over the chest.
Cardiomegaly. No pulmonary venous congestion. Bilateral interstitial
prominence again noted most consistent chronic interstitial lung
disease. An active interstitial process including interstitial edema
and/or pneumonitis cannot be excluded. No focal alveolar infiltrate.
Stable calcified nodule left lung base consistent granuloma. No
prominent pleural effusion or pneumothorax. Total left shoulder
replacement. Surgical clips right upper quadrant.
IMPRESSION: 1. Prior cardiac valve replacement. Cardiomegaly. No pulmonary
venous congestion.

2. Bilateral interstitial prominence again noted most consistent
chronic interstitial lung disease. Active interstitial process
including interstitial edema and/or pneumonitis cannot be excluded.
No focal alveolar infiltrate.

## 2020-03-20 ENCOUNTER — Encounter: Payer: Self-pay | Admitting: Emergency Medicine

## 2020-03-20 ENCOUNTER — Inpatient Hospital Stay: Payer: Medicare HMO

## 2020-03-20 ENCOUNTER — Inpatient Hospital Stay
Admission: EM | Admit: 2020-03-20 | Discharge: 2020-03-28 | DRG: 057 | Disposition: A | Discharge status: Hospice | Payer: Medicare HMO | Attending: Internal Medicine | Admitting: Internal Medicine

## 2020-03-20 ENCOUNTER — Other Ambulatory Visit: Payer: Self-pay

## 2020-03-20 ENCOUNTER — Emergency Department: Payer: Medicare HMO

## 2020-03-20 DIAGNOSIS — Z881 Allergy status to other antibiotic agents status: Secondary | ICD-10-CM | POA: Diagnosis not present

## 2020-03-20 DIAGNOSIS — G3183 Dementia with Lewy bodies: Secondary | ICD-10-CM | POA: Diagnosis present

## 2020-03-20 DIAGNOSIS — Z515 Encounter for palliative care: Secondary | ICD-10-CM | POA: Diagnosis present

## 2020-03-20 DIAGNOSIS — F03C0 Unspecified dementia, severe, without behavioral disturbance, psychotic disturbance, mood disturbance, and anxiety: Secondary | ICD-10-CM | POA: Diagnosis present

## 2020-03-20 DIAGNOSIS — J8410 Pulmonary fibrosis, unspecified: Secondary | ICD-10-CM | POA: Diagnosis present

## 2020-03-20 DIAGNOSIS — Z79890 Hormone replacement therapy (postmenopausal): Secondary | ICD-10-CM

## 2020-03-20 DIAGNOSIS — Z20822 Contact with and (suspected) exposure to covid-19: Secondary | ICD-10-CM | POA: Diagnosis present

## 2020-03-20 DIAGNOSIS — Z66 Do not resuscitate: Secondary | ICD-10-CM | POA: Diagnosis present

## 2020-03-20 DIAGNOSIS — J9611 Chronic respiratory failure with hypoxia: Secondary | ICD-10-CM | POA: Diagnosis present

## 2020-03-20 DIAGNOSIS — E039 Hypothyroidism, unspecified: Secondary | ICD-10-CM | POA: Diagnosis present

## 2020-03-20 DIAGNOSIS — J189 Pneumonia, unspecified organism: Secondary | ICD-10-CM | POA: Diagnosis not present

## 2020-03-20 DIAGNOSIS — E11649 Type 2 diabetes mellitus with hypoglycemia without coma: Secondary | ICD-10-CM | POA: Diagnosis present

## 2020-03-20 DIAGNOSIS — I10 Essential (primary) hypertension: Secondary | ICD-10-CM | POA: Diagnosis present

## 2020-03-20 DIAGNOSIS — Z9981 Dependence on supplemental oxygen: Secondary | ICD-10-CM

## 2020-03-20 DIAGNOSIS — R52 Pain, unspecified: Secondary | ICD-10-CM | POA: Diagnosis not present

## 2020-03-20 DIAGNOSIS — E860 Dehydration: Secondary | ICD-10-CM | POA: Diagnosis present

## 2020-03-20 DIAGNOSIS — Z7982 Long term (current) use of aspirin: Secondary | ICD-10-CM

## 2020-03-20 DIAGNOSIS — Z90710 Acquired absence of both cervix and uterus: Secondary | ICD-10-CM | POA: Diagnosis not present

## 2020-03-20 DIAGNOSIS — Z882 Allergy status to sulfonamides status: Secondary | ICD-10-CM

## 2020-03-20 DIAGNOSIS — F0280 Dementia in other diseases classified elsewhere without behavioral disturbance: Secondary | ICD-10-CM | POA: Diagnosis present

## 2020-03-20 DIAGNOSIS — Z888 Allergy status to other drugs, medicaments and biological substances status: Secondary | ICD-10-CM

## 2020-03-20 DIAGNOSIS — R627 Adult failure to thrive: Secondary | ICD-10-CM | POA: Diagnosis present

## 2020-03-20 DIAGNOSIS — F0390 Unspecified dementia without behavioral disturbance: Secondary | ICD-10-CM | POA: Diagnosis present

## 2020-03-20 DIAGNOSIS — Z79899 Other long term (current) drug therapy: Secondary | ICD-10-CM

## 2020-03-20 DIAGNOSIS — D472 Monoclonal gammopathy: Secondary | ICD-10-CM | POA: Diagnosis present

## 2020-03-20 DIAGNOSIS — I129 Hypertensive chronic kidney disease with stage 1 through stage 4 chronic kidney disease, or unspecified chronic kidney disease: Secondary | ICD-10-CM | POA: Diagnosis present

## 2020-03-20 DIAGNOSIS — E119 Type 2 diabetes mellitus without complications: Secondary | ICD-10-CM

## 2020-03-20 DIAGNOSIS — J849 Interstitial pulmonary disease, unspecified: Secondary | ICD-10-CM | POA: Diagnosis present

## 2020-03-20 DIAGNOSIS — E162 Hypoglycemia, unspecified: Secondary | ICD-10-CM | POA: Diagnosis present

## 2020-03-20 DIAGNOSIS — G20 Parkinson's disease: Secondary | ICD-10-CM | POA: Diagnosis present

## 2020-03-20 DIAGNOSIS — E1122 Type 2 diabetes mellitus with diabetic chronic kidney disease: Secondary | ICD-10-CM | POA: Diagnosis present

## 2020-03-20 DIAGNOSIS — N184 Chronic kidney disease, stage 4 (severe): Secondary | ICD-10-CM | POA: Diagnosis present

## 2020-03-20 DIAGNOSIS — R451 Restlessness and agitation: Secondary | ICD-10-CM | POA: Diagnosis not present

## 2020-03-20 DIAGNOSIS — Z885 Allergy status to narcotic agent status: Secondary | ICD-10-CM | POA: Diagnosis not present

## 2020-03-20 DIAGNOSIS — T68XXXA Hypothermia, initial encounter: Secondary | ICD-10-CM | POA: Diagnosis present

## 2020-03-20 DIAGNOSIS — Z794 Long term (current) use of insulin: Secondary | ICD-10-CM | POA: Diagnosis not present

## 2020-03-20 LAB — URINALYSIS, ROUTINE W REFLEX MICROSCOPIC
Bacteria, UA: NONE SEEN
Bilirubin Urine: NEGATIVE
Glucose, UA: NEGATIVE mg/dL
Hgb urine dipstick: NEGATIVE
Ketones, ur: NEGATIVE mg/dL
Leukocytes,Ua: NEGATIVE
Nitrite: NEGATIVE
Protein, ur: NEGATIVE mg/dL
Specific Gravity, Urine: 1.014 (ref 1.005–1.030)
Squamous Epithelial / HPF: NONE SEEN (ref 0–5)
pH: 5 (ref 5.0–8.0)

## 2020-03-20 LAB — CBC WITH DIFFERENTIAL/PLATELET
Abs Immature Granulocytes: 0.03 10*3/uL (ref 0.00–0.07)
Basophils Absolute: 0.1 10*3/uL (ref 0.0–0.1)
Basophils Relative: 1 %
Eosinophils Absolute: 0.5 10*3/uL (ref 0.0–0.5)
Eosinophils Relative: 6 %
HCT: 35.6 % — ABNORMAL LOW (ref 36.0–46.0)
Hemoglobin: 11.1 g/dL — ABNORMAL LOW (ref 12.0–15.0)
Immature Granulocytes: 0 %
Lymphocytes Relative: 34 %
Lymphs Abs: 3.1 10*3/uL (ref 0.7–4.0)
MCH: 30.3 pg (ref 26.0–34.0)
MCHC: 31.2 g/dL (ref 30.0–36.0)
MCV: 97.3 fL (ref 80.0–100.0)
Monocytes Absolute: 0.8 10*3/uL (ref 0.1–1.0)
Monocytes Relative: 9 %
Neutro Abs: 4.6 10*3/uL (ref 1.7–7.7)
Neutrophils Relative %: 50 %
Platelets: 147 10*3/uL — ABNORMAL LOW (ref 150–400)
RBC: 3.66 MIL/uL — ABNORMAL LOW (ref 3.87–5.11)
RDW: 17.3 % — ABNORMAL HIGH (ref 11.5–15.5)
Smear Review: NORMAL
WBC: 9 10*3/uL (ref 4.0–10.5)
nRBC: 0 % (ref 0.0–0.2)

## 2020-03-20 LAB — SARS CORONAVIRUS 2 BY RT PCR (HOSPITAL ORDER, PERFORMED IN ~~LOC~~ HOSPITAL LAB): SARS Coronavirus 2: NEGATIVE

## 2020-03-20 LAB — COMPREHENSIVE METABOLIC PANEL
ALT: <5 U/L (ref 0–44)
AST: 28 U/L (ref 15–41)
Albumin: 2.9 g/dL — ABNORMAL LOW (ref 3.5–5.0)
Alkaline Phosphatase: 52 U/L (ref 38–126)
Anion gap: 10 (ref 5–15)
BUN: 42 mg/dL — ABNORMAL HIGH (ref 8–23)
CO2: 26 mmol/L (ref 22–32)
Calcium: 9.9 mg/dL (ref 8.9–10.3)
Chloride: 97 mmol/L — ABNORMAL LOW (ref 98–111)
Creatinine, Ser: 1.76 mg/dL — ABNORMAL HIGH (ref 0.44–1.00)
GFR calc Af Amer: 29 mL/min — ABNORMAL LOW (ref >60)
GFR calc non Af Amer: 25 mL/min — ABNORMAL LOW (ref >60)
Glucose, Bld: 47 mg/dL — ABNORMAL LOW (ref 70–99)
Potassium: 4.2 mmol/L (ref 3.5–5.1)
Sodium: 133 mmol/L — ABNORMAL LOW (ref 135–145)
Total Bilirubin: 0.7 mg/dL (ref 0.3–1.2)
Total Protein: 8.4 g/dL — ABNORMAL HIGH (ref 6.5–8.1)

## 2020-03-20 LAB — LIPASE, BLOOD: Lipase: 25 U/L (ref 11–51)

## 2020-03-20 LAB — LACTIC ACID, PLASMA: Lactic Acid, Venous: 0.7 mmol/L (ref 0.5–1.9)

## 2020-03-20 LAB — APTT: aPTT: 28 seconds (ref 24–36)

## 2020-03-20 LAB — PROTIME-INR
INR: 1.1 (ref 0.8–1.2)
Prothrombin Time: 14 seconds (ref 11.4–15.2)

## 2020-03-20 LAB — PROCALCITONIN: Procalcitonin: 0.14 ng/mL

## 2020-03-20 MED ORDER — SODIUM CHLORIDE 0.9 % IV SOLN
2.0000 g | INTRAVENOUS | Status: DC
  Administered 2020-03-20 – 2020-03-21 (×2): 2 g via INTRAVENOUS
  Filled 2020-03-20 (×3): qty 20

## 2020-03-20 MED ORDER — CARBIDOPA-LEVODOPA ER 25-100 MG PO TBCR
1.0000 | EXTENDED_RELEASE_TABLET | Freq: Two times a day (BID) | ORAL | Status: DC
  Administered 2020-03-21 – 2020-03-22 (×2): 1 via ORAL
  Filled 2020-03-20 (×10): qty 1

## 2020-03-20 MED ORDER — HEPARIN SODIUM (PORCINE) 5000 UNIT/ML IJ SOLN
5000.0000 [IU] | Freq: Three times a day (TID) | INTRAMUSCULAR | Status: DC
  Administered 2020-03-20 – 2020-03-22 (×5): 5000 [IU] via SUBCUTANEOUS
  Filled 2020-03-20 (×5): qty 1

## 2020-03-20 MED ORDER — CARBIDOPA-LEVODOPA 25-100 MG PO TABS
1.0000 | ORAL_TABLET | Freq: Three times a day (TID) | ORAL | Status: DC
  Administered 2020-03-21 – 2020-03-23 (×4): 1 via ORAL
  Filled 2020-03-20 (×12): qty 1

## 2020-03-20 MED ORDER — ALLOPURINOL 100 MG PO TABS
300.0000 mg | ORAL_TABLET | Freq: Every day | ORAL | Status: DC
  Administered 2020-03-22: 300 mg via ORAL
  Filled 2020-03-20 (×2): qty 1
  Filled 2020-03-20: qty 3

## 2020-03-20 MED ORDER — VITAMIN B-12 1000 MCG PO TABS
1000.0000 ug | ORAL_TABLET | Freq: Every day | ORAL | Status: DC
  Administered 2020-03-21: 1000 ug via ORAL
  Filled 2020-03-20 (×2): qty 1

## 2020-03-20 MED ORDER — ONDANSETRON HCL 4 MG/2ML IJ SOLN
INTRAMUSCULAR | Status: AC
  Filled 2020-03-20: qty 2

## 2020-03-20 MED ORDER — GABAPENTIN 100 MG PO CAPS
100.0000 mg | ORAL_CAPSULE | Freq: Three times a day (TID) | ORAL | Status: DC
  Administered 2020-03-21 – 2020-03-23 (×3): 100 mg via ORAL
  Filled 2020-03-20 (×9): qty 1

## 2020-03-20 MED ORDER — ADULT MULTIVITAMIN W/MINERALS CH
1.0000 | ORAL_TABLET | Freq: Every day | ORAL | Status: DC
  Administered 2020-03-21: 1 via ORAL
  Filled 2020-03-20: qty 1

## 2020-03-20 MED ORDER — SODIUM CHLORIDE 0.9 % IV SOLN
500.0000 mg | INTRAVENOUS | Status: DC
Start: 2020-03-20 — End: 2020-03-20

## 2020-03-20 MED ORDER — AMLODIPINE BESYLATE 5 MG PO TABS
2.5000 mg | ORAL_TABLET | Freq: Every day | ORAL | Status: DC
  Filled 2020-03-20: qty 1

## 2020-03-20 MED ORDER — METOPROLOL SUCCINATE ER 100 MG PO TB24
100.0000 mg | ORAL_TABLET | Freq: Two times a day (BID) | ORAL | Status: DC
  Administered 2020-03-21 – 2020-03-22 (×2): 100 mg via ORAL
  Filled 2020-03-20 (×2): qty 1

## 2020-03-20 MED ORDER — SODIUM CHLORIDE 0.9 % IV SOLN: INTRAVENOUS | Status: DC

## 2020-03-20 MED ORDER — PRAVASTATIN SODIUM 20 MG PO TABS
80.0000 mg | ORAL_TABLET | Freq: Every day | ORAL | Status: DC
  Administered 2020-03-21: 80 mg via ORAL
  Filled 2020-03-20: qty 4
  Filled 2020-03-20 (×2): qty 2

## 2020-03-20 MED ORDER — LEVOTHYROXINE SODIUM 100 MCG PO TABS
100.0000 ug | ORAL_TABLET | Freq: Every day | ORAL | Status: DC
  Administered 2020-03-21: 100 ug via ORAL
  Filled 2020-03-20: qty 1
  Filled 2020-03-20: qty 2

## 2020-03-20 MED ORDER — VITAMIN D 25 MCG (1000 UNIT) PO TABS
1000.0000 [IU] | ORAL_TABLET | Freq: Every day | ORAL | Status: DC
  Administered 2020-03-21: 1000 [IU] via ORAL
  Filled 2020-03-20: qty 1

## 2020-03-20 MED ORDER — TRIAMCINOLONE ACETONIDE 0.1 % EX CREA
1.0000 "application " | TOPICAL_CREAM | Freq: Two times a day (BID) | CUTANEOUS | Status: DC
  Administered 2020-03-21 – 2020-03-28 (×5): 1 via TOPICAL
  Filled 2020-03-20: qty 15

## 2020-03-20 MED ORDER — FUROSEMIDE 40 MG PO TABS: 40.0000 mg | ORAL_TABLET | Freq: Every day | ORAL | Status: DC

## 2020-03-20 MED ORDER — SODIUM CHLORIDE 0.9 % IV BOLUS
1000.0000 mL | Freq: Once | INTRAVENOUS | Status: AC
  Administered 2020-03-20: 1000 mL via INTRAVENOUS

## 2020-03-20 MED ORDER — ASPIRIN EC 81 MG PO TBEC
81.0000 mg | DELAYED_RELEASE_TABLET | Freq: Every day | ORAL | Status: DC
  Filled 2020-03-20: qty 1

## 2020-03-20 MED ORDER — HALOPERIDOL LACTATE 5 MG/ML IJ SOLN
1.0000 mg | Freq: Once | INTRAMUSCULAR | Status: AC
  Administered 2020-03-20: 1 mg via INTRAVENOUS
  Filled 2020-03-20: qty 1

## 2020-03-20 MED ORDER — FAMOTIDINE 20 MG PO TABS
20.0000 mg | ORAL_TABLET | Freq: Every day | ORAL | Status: DC
  Filled 2020-03-20: qty 1

## 2020-03-20 MED ORDER — ROPINIROLE HCL 1 MG PO TABS
1.0000 mg | ORAL_TABLET | Freq: Three times a day (TID) | ORAL | Status: DC
  Administered 2020-03-21 – 2020-03-23 (×4): 1 mg via ORAL
  Filled 2020-03-20 (×9): qty 1

## 2020-03-20 MED ORDER — CLONIDINE HCL 0.1 MG PO TABS: 0.1000 mg | ORAL_TABLET | Freq: Three times a day (TID) | ORAL | Status: DC

## 2020-03-20 MED ORDER — SODIUM CHLORIDE 0.9 % IV SOLN
500.0000 mg | INTRAVENOUS | Status: DC
  Administered 2020-03-20 – 2020-03-21 (×2): 500 mg via INTRAVENOUS
  Filled 2020-03-20 (×3): qty 500

## 2020-03-20 MED ORDER — QUETIAPINE FUMARATE 25 MG PO TABS
25.0000 mg | ORAL_TABLET | Freq: Every day | ORAL | Status: DC
  Administered 2020-03-21: 25 mg via ORAL
  Filled 2020-03-20: qty 1

## 2020-03-20 MED ORDER — OMEGA-3-ACID ETHYL ESTERS 1 G PO CAPS
1.0000 g | ORAL_CAPSULE | Freq: Every day | ORAL | Status: DC
  Administered 2020-03-21: 1 g via ORAL
  Filled 2020-03-20 (×2): qty 1

## 2020-03-20 MED ORDER — SODIUM CHLORIDE 0.9 % IV SOLN: 2.0000 g | INTRAVENOUS | Status: DC

## 2020-03-20 NOTE — H&P (Addendum)
History and Physical    [name] [name] VZC:[id number] DOB: 11/25/1928 DOA: 03/20/2020  PCP: Gayland Curry, MD   Patient coming from: Home  I have personally briefly reviewed patient's old medical records in [place]  Chief Complaint: Cough                                Shortness of breath  Most of the history was obtained from patient's son who was at the bedside.  HPI: [name] [name] is a 86 y.o. female with medical history significant for Lewy body dementia, interstitial lung disease with chronic respiratory failure on 2 L of oxygen and Parkinson's disease who was brought into the hospital by EMS for evaluation of mental status changes associated with a cough productive of brown phlegm.  She was hypothermic upon arrival to the ER and on 2 L had a pulse oximetry of 98%. Chest x-ray showed Increasing interstitial marking since the prior study may represent superimposed edema or atypical infection, on a background of interstitial lung disease Unable to do a review of systems due to her underlying dementia.   ED Course: Elderly female brought into the ER by EMS for evaluation of mental status changes as well as a cough productive of brown phlegm.  Patient will be admitted to the hospital for possible community-acquired pneumonia.  Review of Systems: As per HPI otherwise 10 point review of systems negative.    Past Medical History:  Diagnosis Date  . Diabetes mellitus without complication ([place])   . Hypertension   . Thyroid disease     Past Surgical History:  Procedure Laterality Date  . ABDOMINAL HYSTERECTOMY    . BREAST BIOPSY Right 20+yrs ago   negative     reports that she has never smoked. She has never used smokeless tobacco. She reports that she does not drink alcohol. No history on file for drug.  Allergies  Allergen Reactions  . Fluoxetine     Other reaction(s): Other (See Comments) Drowsy-wanted to sleep all day  . Ace Inhibitors Cough and  Other (See Comments)     hyperkalemia  hyperkalemia   . Codeine Nausea Only  . Sulfonamide Derivatives Other (See Comments)    Reaction: unknown    Family History  Problem Relation Age of Onset  . Breast cancer Mother 34     Prior to Admission medications   Medication Sig Start Date End Date Taking? Authorizing Provider  allopurinol (ZYLOPRIM) 300 MG tablet Take 300 mg by mouth daily. 06/14/16  Yes [provider]  amLODipine (NORVASC) 2.5 MG tablet Take 2.5 mg by mouth daily. 02/18/20  Yes [provider]  aspirin EC 81 MG tablet Take 81 mg by mouth daily.   Yes [provider]  carbidopa-levodopa (SINEMET IR) 25-100 MG tablet Take by mouth. 03/07/20 06/05/20 Yes [provider]  Carbidopa-Levodopa ER (SINEMET CR) 25-100 MG tablet controlled release Take by mouth. 02/15/20  Yes [provider]  cholecalciferol (VITAMIN D) 1000 units tablet Take 1,000 Units by mouth daily at 12 noon.   Yes [provider]  cloNIDine (CATAPRES) 0.1 MG tablet Take 0.1 mg by mouth 3 (three) times daily.  06/16/16  Yes [provider]  furosemide (LASIX) 40 MG tablet Take 1 tablet (40 mg total) by mouth daily. Patient taking differently: Take 20 mg by mouth 2 days. Sunday, Tuesday Thursday, Saturday 07/23/16  Yes Gladstone Lighter, MD  gabapentin (  NEURONTIN) 100 MG capsule Take 100 mg by mouth 3 (three) times daily. 06/14/16  Yes [provider]  glipiZIDE (GLUCOTROL) 10 MG tablet Take 10 mg by mouth 2 (two) times daily before a meal.  06/14/16  Yes [provider]  insulin detemir (LEVEMIR) 100 UNIT/ML injection Inject 12-14 Units into the skin at bedtime.    Yes [provider]  insulin lispro (HUMALOG) 100 UNIT/ML injection Inject 2 Units into the skin daily as needed. Use 2 units for every 50 glucometer points over 200 with your after meal glucose checks   Yes [provider]  levothyroxine (SYNTHROID, LEVOTHROID)  100 MCG tablet Take 100 mcg by mouth daily. 06/14/16  Yes [provider]  metoprolol succinate (TOPROL-XL) 100 MG 24 hr tablet Take 1 tablet (100 mg total) by mouth 2 (two) times daily. 07/22/16  Yes Gladstone Lighter, MD  Multiple Vitamin (MULTIVITAMIN WITH MINERALS) TABS tablet Take 1 tablet by mouth daily at 12 noon.   Yes [provider]  omega-3 acid ethyl esters (LOVAZA) 1 g capsule Take 1 g by mouth daily at 12 noon.   Yes [provider]  pravastatin (PRAVACHOL) 80 MG tablet Take 80 mg by mouth at bedtime. 11/08/15  Yes [provider]  ranitidine (ZANTAC) 150 MG tablet Take 150 mg by mouth at bedtime.   Yes [provider]  rOPINIRole (REQUIP) 0.5 MG tablet One to Two pills by mouth at bedtime. 11/08/15  Yes [provider]  triamcinolone cream (KENALOG) 0.1 % Apply 1 application topically 2 (two) times daily. 07/07/16  Yes [provider]  vitamin B-12 (CYANOCOBALAMIN) 1000 MCG tablet Take 1,000 mcg by mouth daily at 12 noon.   Yes [provider]  QUEtiapine (SEROQUEL) 25 MG tablet Take 25 mg by mouth at bedtime. 03/07/20   [provider]    Physical Exam: Vitals:   03/20/20 1051 03/20/20 1100 03/20/20 1130 03/20/20 1200  BP:  (!) 121/56 (!) 115/53 (!) 116/53  Pulse:  (!) 54 (!) 53 (!) 54  Resp:  15 12 15   Temp: (!) 95.8 F (35.4 C)     TempSrc: Axillary     SpO2:  95% 92% 91%  Weight: 63.5 kg     Height: 5\' 2"  (1.575 m)        Vitals:   03/20/20 1051 03/20/20 1100 03/20/20 1130 03/20/20 1200  BP:  (!) 121/56 (!) 115/53 (!) 116/53  Pulse:  (!) 54 (!) 53 (!) 54  Resp:  15 12 15   Temp: (!) 95.8 F (35.4 C)     TempSrc: Axillary     SpO2:  95% 92% 91%  Weight: 63.5 kg     Height: 5\' 2"  (1.575 m)       Constitutional: NAD, alert and oriented only to person Eyes: PERRL, lids and conjunctivae normal ENMT: Mucous membranes are moist.  Neck: normal, supple, no masses, no  thyromegaly Respiratory: Faint rhonchi at the bases, no wheezing, no crackles. Normal respiratory effort. No accessory muscle use.  Cardiovascular: Regular rate and rhythm, no murmurs / rubs / gallops. No extremity edema. 2+ pedal pulses. No carotid bruits.  Abdomen: no tenderness, no masses palpated. No hepatosplenomegaly. Bowel sounds positive.  Musculoskeletal: no clubbing / cyanosis. No joint deformity upper and lower extremities.  Skin: no rashes, lesions, ulcers.  Ecchymosis on both forearms and anterior chest wall Neurologic: No gross focal neurologic deficit. Psychiatric: Normal mood and affect.   Labs on Admission: I have personally  reviewed following labs and imaging studies  CBC: Recent Labs  Lab 03/20/20 1042  WBC 9.0  NEUTROABS 4.6  HGB 11.1*  HCT 35.6*  MCV 97.3  PLT 941*   Basic Metabolic Panel: Recent Labs  Lab 03/20/20 1042  NA 133*  K 4.2  CL 97*  CO2 26  GLUCOSE 47*  BUN 42*  CREATININE 1.76*  CALCIUM 9.9   GFR: Estimated Creatinine Clearance: 19.4 mL/min (A) (by C-G formula based on SCr of 1.76 mg/dL (H)). Liver Function Tests: Recent Labs  Lab 03/20/20 1042  AST 28  ALT <5  ALKPHOS 52  BILITOT 0.7  PROT 8.4*  ALBUMIN 2.9*   Recent Labs  Lab 03/20/20 1042  LIPASE 25   No results for input(s): AMMONIA in the last 168 hours. Coagulation Profile: Recent Labs  Lab 03/20/20 1042  INR 1.1   Cardiac Enzymes: No results for input(s): CKTOTAL, CKMB, CKMBINDEX, TROPONINI in the last 168 hours. BNP (last 3 results) No results for input(s): PROBNP in the last 8760 hours. HbA1C: No results for input(s): HGBA1C in the last 72 hours. CBG: No results for input(s): GLUCAP in the last 168 hours. Lipid Profile: No results for input(s): CHOL, HDL, LDLCALC, TRIG, CHOLHDL, LDLDIRECT in the last 72 hours. Thyroid Function Tests: No results for input(s): TSH, T4TOTAL, FREET4, T3FREE, THYROIDAB in the last 72 hours. Anemia Panel: No results for  input(s): VITAMINB12, FOLATE, FERRITIN, TIBC, IRON, RETICCTPCT in the last 72 hours. Urine analysis:    Component Value Date/Time   COLORURINE YELLOW (A) 03/20/2020 1042   APPEARANCEUR CLEAR (A) 03/20/2020 1042   APPEARANCEUR Clear 05/31/2012 1102   LABSPEC 1.014 03/20/2020 1042   LABSPEC 1.005 05/31/2012 1102   PHURINE 5.0 03/20/2020 1042   GLUCOSEU NEGATIVE 03/20/2020 1042   GLUCOSEU Negative 05/31/2012 1102   HGBUR NEGATIVE 03/20/2020 1042   BILIRUBINUR NEGATIVE 03/20/2020 1042   BILIRUBINUR Negative 05/31/2012 [place] 03/20/2020 1042   PROTEINUR NEGATIVE 03/20/2020 1042   NITRITE NEGATIVE 03/20/2020 1042   LEUKOCYTESUR NEGATIVE 03/20/2020 1042   LEUKOCYTESUR 1+ 05/31/2012 1102    Radiological Exams on Admission: DG Chest Port 1 View  Result Date: 03/20/2020 CLINICAL DATA:  Diffuse crackles with shortness of breath EXAM: PORTABLE CHEST 1 VIEW COMPARISON:  12/11/2019 FINDINGS: Cardiomediastinal contours are stable, partially obscured by diffuse interstitial and airspace opacities. Increasing interstitial marking since the prior study. No areas of dense consolidative change. The no sign of pleural effusion. Signs of LEFT shoulder arthroplasty. Postoperative changes over the chest related to prior valvular replacement. IMPRESSION: Increasing interstitial marking since the prior study may represent superimposed edema or atypical infection, on a background of interstitial lung disease. Electronically Signed   By: Zetta Bills M.D.   On: 03/20/2020 11:17    EKG: Independently reviewed.  Sinus rhythm Right bundle branch block  Assessment/Plan Principal Problem:   CAP (community acquired pneumonia) Active Problems:   Diabetes ([place])   Essential hypertension   INTERSTITIAL LUNG DISEASE   CKD (chronic kidney disease), stage IV (HCC)    Community-acquired pneumonia (POA) Patient was hypothermic when she arrived and remains on a Bair hugger We will place patient  empirically on Rocephin and Zithromax Follow-up results of blood cultures Obtain CT chest without contrast for further evaluation   History of interstitial lung disease with chronic respiratory failure Continue oxygen supplementation at 2 L to maintain pulse oximetry greater than 92%   Diabetes mellitus with complications of stage IV chronic kidney disease Maintain consistent  carbohydrate diet Place patient on sliding scale coverage Close monitoring of renal function   Hypertension Blood pressure is stable on multiple antihypertensive medication Continue clonidine, metoprolol and amlodipine Monitor closely for bradycardia    Body dementia with Parkinson's disease Continue Sinemet Continue Seroquel at bedtime   Hypothyroidism Continue Synthroid   DVT prophylaxis: Heparin Code Status: DNR Family Communication: Greater than 50% of that was spent discussing patient's condition and plan of care with her son who was at the bedside.  All questions and concerns have been addressed. Disposition Plan: Back to previous home environment Consults called: None    Shown Dissinger MD [facility]     03/20/2020, 12:33 PM

## 2020-03-20 NOTE — ED Provider Notes (Signed)
[facility] Emergency Department Provider Note  ____________________________________________  Time seen: Approximately 12:35 PM  I have reviewed the triage vital signs and the nursing notes.   HISTORY  Chief Complaint Code Sepsis and Altered Mental Status    Level 5 Caveat: Portions of the History and Physical including HPI and review of systems are unable to be completely obtained due to patient being a poor historian   HPI [name] [name] is a 86 y.o. female with history of diabetes hypertension  CKD CHF and dementia who was brought to the ED due to increased agitation and confusion this morning as well as increased shortness of breath and productive cough.  EMS reported initial oxygen saturation in the 80s on her usual 2 L nasal cannula so they increased her to 4 L.  Son at bedside says that patient has not been eating or drinking and has had low energy for the past 3 days.  Patient arrives with valid DNR certificate.     Past Medical History:  Diagnosis Date  . Diabetes mellitus without complication ([place])   . Hypertension   . Thyroid disease      Patient Active Problem List   Diagnosis Date Noted  . Eosinophilia 01/06/2019  . Goals of care, counseling/discussion 01/06/2019  . IgM monoclonal gammopathy of uncertain significance 12/22/2018  . Macrocytic anemia 12/15/2018  . CHF (congestive heart failure) ([place]) 10/21/2017  . Sepsis ([place]) 07/20/2016  . CAP (community acquired pneumonia) 07/20/2016  . Elevated troponin 07/20/2016  . CKD (chronic kidney disease), stage IV ([place]) 07/17/2008  . Diabetes ([place]) 06/12/2008  . OSTEOPENIA 06/12/2008  . Essential hypertension 10/12/2007  . INTERSTITIAL LUNG DISEASE 10/12/2007  . OTH SPEC ALVEOL&PARIETOALVEOL PNEUMONOPATHIES 10/12/2007  . HYPERGLYCEMIA 10/12/2007     Past Surgical History:  Procedure Laterality Date  . ABDOMINAL HYSTERECTOMY    . BREAST BIOPSY Right 20+yrs ago   negative      Prior to Admission medications   Medication Sig Start Date End Date Taking? Authorizing Provider  allopurinol (ZYLOPRIM) 300 MG tablet Take 300 mg by mouth daily. 06/14/16  Yes [provider]  amLODipine (NORVASC) 2.5 MG tablet Take 2.5 mg by mouth daily. 02/18/20  Yes [provider]  aspirin EC 81 MG tablet Take 81 mg by mouth daily.   Yes [provider]  carbidopa-levodopa (SINEMET IR) 25-100 MG tablet Take by mouth. 03/07/20 06/05/20 Yes [provider]  Carbidopa-Levodopa ER (SINEMET CR) 25-100 MG tablet controlled release Take by mouth. 02/15/20  Yes [provider]  cholecalciferol (VITAMIN D) 1000 units tablet Take 1,000 Units by mouth daily at 12 noon.   Yes [provider]  cloNIDine (CATAPRES) 0.1 MG tablet Take 0.1 mg by mouth 3 (three) times daily.  06/16/16  Yes [provider]  furosemide (LASIX) 40 MG tablet Take 1 tablet (40 mg total) by mouth daily. Patient taking differently: Take 20 mg by mouth 2 days. Sunday, Tuesday Thursday, Saturday 07/23/16  Yes Gladstone Lighter, MD  gabapentin (NEURONTIN) 100 MG capsule Take 100 mg by mouth 3 (three) times daily. 06/14/16  Yes [provider]  glipiZIDE (GLUCOTROL) 10 MG tablet Take 10 mg by mouth 2 (two) times daily before a meal.  06/14/16  Yes [provider]  insulin detemir (LEVEMIR) 100 UNIT/ML injection Inject 12-14 Units into the skin at bedtime.    Yes [provider]  insulin lispro (HUMALOG) 100 UNIT/ML injection Inject 2 Units into the skin daily as needed. Use  2 units for every 50 glucometer points over 200 with your after meal glucose checks   Yes [provider]  levothyroxine (SYNTHROID, LEVOTHROID) 100 MCG tablet Take 100 mcg by mouth daily. 06/14/16  Yes [provider]  metoprolol succinate (TOPROL-XL) 100 MG 24 hr tablet Take 1 tablet (100 mg total) by mouth 2 (two) times daily. 07/22/16  Yes Gladstone Lighter, MD   Multiple Vitamin (MULTIVITAMIN WITH MINERALS) TABS tablet Take 1 tablet by mouth daily at 12 noon.   Yes [provider]  omega-3 acid ethyl esters (LOVAZA) 1 g capsule Take 1 g by mouth daily at 12 noon.   Yes [provider]  pravastatin (PRAVACHOL) 80 MG tablet Take 80 mg by mouth at bedtime. 11/08/15  Yes [provider]  ranitidine (ZANTAC) 150 MG tablet Take 150 mg by mouth at bedtime.   Yes [provider]  rOPINIRole (REQUIP) 0.5 MG tablet One to Two pills by mouth at bedtime. 11/08/15  Yes [provider]  triamcinolone cream (KENALOG) 0.1 % Apply 1 application topically 2 (two) times daily. 07/07/16  Yes [provider]  vitamin B-12 (CYANOCOBALAMIN) 1000 MCG tablet Take 1,000 mcg by mouth daily at 12 noon.   Yes [provider]  QUEtiapine (SEROQUEL) 25 MG tablet Take 25 mg by mouth at bedtime. 03/07/20   [provider]     Allergies Fluoxetine, Ace inhibitors, Codeine, and Sulfonamide derivatives   Family History  Problem Relation Age of Onset  . Breast cancer Mother 1    Social History Social History   Tobacco Use  . Smoking status: Never Smoker  . Smokeless tobacco: Never Used  Substance Use Topics  . Alcohol use: No  . Drug use: Not on file    Review of Systems Level 5 Caveat: Portions of the History and Physical including HPI and review of systems are unable to be completely obtained due to patient being a poor historian   Constitutional:   No known fever.  ENT:   No rhinorrhea. Cardiovascular:   No chest pain or syncope. Respiratory:   Positive shortness of breath and productive cough. Gastrointestinal:   Negative for abdominal pain, vomiting and diarrhea.  Musculoskeletal:   Negative for focal pain or swelling ____________________________________________   PHYSICAL EXAM:  VITAL SIGNS: ED Triage Vitals  Enc Vitals Group     BP 03/20/20 1043 (!) 154/71     Pulse Rate 03/20/20 1043  62     Resp 03/20/20 1043 (!) 31     Temp 03/20/20 1051 (!) 95.8 F (35.4 C)     Temp Source 03/20/20 1043 Axillary     SpO2 03/20/20 1043 100 %     Weight 03/20/20 1051 140 lb (63.5 kg)     Height 03/20/20 1051 5\' 2"  (1.575 m)     Head Circumference --      Peak Flow --      Pain Score --      Pain Loc --      Pain Edu? --      Excl. in [place]? --     Vital signs reviewed, nursing assessments reviewed.   Constitutional: Awake and alert, not oriented.  Ill-appearing. Eyes:   Conjunctivae are normal. EOMI. PERRL. ENT      Head:   Normocephalic and atraumatic.      Nose:   No congestion/rhinnorhea.       Mouth/Throat:   Dry mucous membranes, no pharyngeal erythema. No peritonsillar mass.  Neck:   No meningismus. Full ROM. Hematological/Lymphatic/Immunilogical:   No cervical lymphadenopathy. Cardiovascular:   Bradycardia heart rate 55. Symmetric bilateral radial and DP pulses.  No murmurs. Cap refill less than 2 seconds. Respiratory: Tachypnea, diffuse inspiratory crackles.  Symmetric breath sounds in all lung fields. Gastrointestinal:   Soft and nontender. Non distended. There is no CVA tenderness.  No rebound, rigidity, or guarding. Musculoskeletal:   Normal range of motion in all extremities. No joint effusions.  No lower extremity tenderness.  No edema. Neurologic:   Normal speech but nonlinear and incoherent language..  Motor grossly intact. No acute focal neurologic deficits are appreciated.  Skin:    Skin is warm, dry and intact. No rash noted.  No petechiae, purpura, or bullae.  ____________________________________________    LABS (pertinent positives/negatives) (all labs ordered are listed, but only abnormal results are displayed) Labs Reviewed  COMPREHENSIVE METABOLIC PANEL - Abnormal; Notable for the following components:      Result Value   Sodium 133 (*)    Chloride 97 (*)    Glucose, Bld 47 (*)    BUN 42 (*)    Creatinine, Ser 1.76 (*)    Total Protein  8.4 (*)    Albumin 2.9 (*)    GFR calc non Af Amer 25 (*)    GFR calc Af Amer 29 (*)    All other components within normal limits  CBC WITH DIFFERENTIAL/PLATELET - Abnormal; Notable for the following components:   RBC 3.66 (*)    Hemoglobin 11.1 (*)    HCT 35.6 (*)    RDW 17.3 (*)    Platelets 147 (*)    All other components within normal limits  URINALYSIS, ROUTINE W REFLEX MICROSCOPIC - Abnormal; Notable for the following components:   Color, Urine YELLOW (*)    APPearance CLEAR (*)    All other components within normal limits  SARS CORONAVIRUS 2 BY RT PCR (HOSPITAL ORDER, [place] [facility])  CULTURE, BLOOD (ROUTINE X 2)  CULTURE, BLOOD (ROUTINE X 2)  URINE CULTURE  LACTIC ACID, PLASMA  APTT  PROTIME-INR  PROCALCITONIN  LIPASE, BLOOD  LACTIC ACID, PLASMA   ____________________________________________   EKG  Interpreted by me Sinus rhythm rate of 61, left axis, first-degree AV block.  Right bundle branch block.  No acute ischemic changes.  Normal ST segments and T waves  ____________________________________________    RADIOLOGY  DG Chest Port 1 View  Result Date: 03/20/2020 CLINICAL DATA:  Diffuse crackles with shortness of breath EXAM: PORTABLE CHEST 1 VIEW COMPARISON:  12/11/2019 FINDINGS: Cardiomediastinal contours are stable, partially obscured by diffuse interstitial and airspace opacities. Increasing interstitial marking since the prior study. No areas of dense consolidative change. The no sign of pleural effusion. Signs of LEFT shoulder arthroplasty. Postoperative changes over the chest related to prior valvular replacement. IMPRESSION: Increasing interstitial marking since the prior study may represent superimposed edema or atypical infection, on a background of interstitial lung disease. Electronically Signed   By: Zetta Bills M.D.   On: 03/20/2020 11:17    ____________________________________________   PROCEDURES .Critical  Care Performed by: Carrie Mew, MD Authorized by: Carrie Mew, MD   Critical care provider statement:    Critical care time (minutes):  35   Critical care time was exclusive of:  Separately billable procedures and treating other patients   Critical care was necessary to treat or prevent imminent or life-threatening deterioration of the following conditions:  Sepsis, respiratory failure and CNS failure  or compromise   Critical care was time spent personally by me on the following activities:  Development of treatment plan with patient or surrogate, discussions with consultants, evaluation of patient's response to treatment, examination of patient, obtaining history from patient or surrogate, ordering and performing treatments and interventions, ordering and review of laboratory studies, ordering and review of radiographic studies, pulse oximetry, re-evaluation of patient's condition and review of old charts    ____________________________________________  DIFFERENTIAL DIAGNOSIS   Bilateral pneumonia, Covid, pulmonary edema, COPD exacerbation, dehydration  CLINICAL IMPRESSION / ASSESSMENT AND PLAN / ED COURSE  Medications ordered in the ED: Medications  ondansetron (ZOFRAN) 4 MG/2ML injection (  Not Given 03/20/20 1129)  cefTRIAXone (ROCEPHIN) 2 g in sodium chloride 0.9 % 100 mL IVPB (0 g Intravenous Stopped 03/20/20 1159)  azithromycin (ZITHROMAX) 500 mg in sodium chloride 0.9 % 250 mL IVPB (500 mg Intravenous New Bag/Given 03/20/20 1201)  sodium chloride 0.9 % bolus 1,000 mL (1,000 mLs Intravenous New Bag/Given 03/20/20 1128)    Pertinent labs & imaging results that were available during my care of the patient were reviewed by me and considered in my medical decision making (see chart for details).   [name] [name] was evaluated in Emergency Department on 03/20/2020 for the symptoms described in the history of present illness. She was evaluated in the context of the global  COVID-19 pandemic, which necessitated consideration that the patient might be at risk for infection with the SARS-CoV-2 virus that causes COVID-19. Institutional protocols and algorithms that pertain to the evaluation of patients at risk for COVID-19 are in a state of rapid change based on information released by regulatory bodies including the CDC and federal and state organizations. These policies and algorithms were followed during the patient's care in the ED.     Clinical Course as of Mar 20 1234  Wed Mar 20, 2020  1057 Patient presents with hypothermia, tachypnea.  Oxygenation stable on her chronic 2 L nasal cannula.  No signs of shock.  Worrisome for developing sepsis with multifocal pneumonia.  Will check labs, chest x-ray, start ceftriaxone and azithromycin.  She does appear dehydrated so I will give her 1 L fluid bolus for hydration.   [PS]  52 Cxr interpreted by me - shows acute bilateral infiltrates superimposed on chronic emphysematous disease. C/w bilateral pneumonia. Will f/u covid swab as well. Cont. Abx. Plan to admit.    [PS]  89 Rad report on cxr confirms acute diffuse infiltrates on CLD   [PS]  1200 Ordered Hospitalist consult for admission   [PS]  1226 D/w hospitalist for admit.   [PS]    Clinical Course User Index [PS] Carrie Mew, MD     ____________________________________________   FINAL CLINICAL IMPRESSION(S) / ED DIAGNOSES    Final diagnoses:  Pneumonia of both lungs due to infectious organism, unspecified part of lung  Advanced dementia ([place])  Chronic respiratory failure with hypoxia ([place])  Stage 4 chronic kidney disease [facility])     ED Discharge Orders    None      Portions of this note were generated with dragon dictation software. Dictation errors may occur despite best attempts at proofreading.   Carrie Mew, MD 03/20/20 1239

## 2020-03-20 NOTE — Consult Note (Signed)
CODE SEPSIS - PHARMACY COMMUNICATION  **Broad Spectrum Antibiotics should be administered within 1 hour of Sepsis diagnosis**  Time Code Sepsis Called/Page Received: 1056  Antibiotics Ordered: ceftriaxone and azithromycin  Time of 1st antibiotic administration: 1128  Additional action taken by pharmacy: none required  If necessary, Name of Provider/Nurse Contacted: N/A    Dallie Piles ,PharmD Clinical Pharmacist  03/20/2020  11:31 AM

## 2020-03-20 NOTE — ED Notes (Addendum)
MD Agbata notified d/t patient becoming increasingly upset and pt being unable to be redirected d/t dementia status. See order 1mg  haldol.

## 2020-03-20 NOTE — ED Notes (Signed)
Pt taken to CT.

## 2020-03-20 NOTE — ED Notes (Signed)
Pt changed and placed in clean brief and clean chux pads. Bear hugger removed d/t oral temp of 98.6.

## 2020-03-20 NOTE — ED Triage Notes (Addendum)
Per EMS AMS x 2 days, cough with brown sputum, sepsis alert called PTA. VSS. DNR present with patient. Pt with noted cough upon arrival to ED.   98.5 60 1st degree with RBB 98% 2L chronic 154/65  Pt's son reports that pt woke up this morning and recognized him which is not baseline and then had period where she was unable to take medications and unable to recongize him. Pt's son reports hx of dementia which is baseline for patient and she is only oriented to self at baseline.

## 2020-03-20 NOTE — ED Notes (Signed)
Pt brief changed.  

## 2020-03-20 NOTE — ED Notes (Signed)
Bear hugger remains on patient set at [facility].

## 2020-03-21 DIAGNOSIS — R404 Transient alteration of awareness: Secondary | ICD-10-CM

## 2020-03-21 LAB — CBC
HCT: 34.1 % — ABNORMAL LOW (ref 36.0–46.0)
Hemoglobin: 10.8 g/dL — ABNORMAL LOW (ref 12.0–15.0)
MCH: 30.7 pg (ref 26.0–34.0)
MCHC: 31.7 g/dL (ref 30.0–36.0)
MCV: 96.9 fL (ref 80.0–100.0)
Platelets: 170 10*3/uL (ref 150–400)
RBC: 3.52 MIL/uL — ABNORMAL LOW (ref 3.87–5.11)
RDW: 17.5 % — ABNORMAL HIGH (ref 11.5–15.5)
WBC: 9.2 10*3/uL (ref 4.0–10.5)
nRBC: 0 % (ref 0.0–0.2)

## 2020-03-21 LAB — GLUCOSE, CAPILLARY
Glucose-Capillary: 159 mg/dL — ABNORMAL HIGH (ref 70–99)
Glucose-Capillary: 185 mg/dL — ABNORMAL HIGH (ref 70–99)

## 2020-03-21 LAB — BASIC METABOLIC PANEL
Anion gap: 12 (ref 5–15)
BUN: 28 mg/dL — ABNORMAL HIGH (ref 8–23)
CO2: 22 mmol/L (ref 22–32)
Calcium: 9.2 mg/dL (ref 8.9–10.3)
Chloride: 100 mmol/L (ref 98–111)
Creatinine, Ser: 1.27 mg/dL — ABNORMAL HIGH (ref 0.44–1.00)
GFR calc Af Amer: 44 mL/min — ABNORMAL LOW (ref >60)
GFR calc non Af Amer: 38 mL/min — ABNORMAL LOW (ref >60)
Glucose, Bld: 111 mg/dL — ABNORMAL HIGH (ref 70–99)
Potassium: 5.1 mmol/L (ref 3.5–5.1)
Sodium: 134 mmol/L — ABNORMAL LOW (ref 135–145)

## 2020-03-21 LAB — BLOOD CULTURE ID PANEL (REFLEXED)

## 2020-03-21 LAB — HEMOGLOBIN A1C
Hgb A1c MFr Bld: 6.9 % — ABNORMAL HIGH (ref 4.8–5.6)
Mean Plasma Glucose: 151.33 mg/dL

## 2020-03-21 LAB — URINE CULTURE: Culture: NO GROWTH

## 2020-03-21 MED ORDER — ONDANSETRON 4 MG PO TBDP: 4.0000 mg | ORAL_TABLET | Freq: Three times a day (TID) | ORAL | Status: DC | PRN

## 2020-03-21 MED ORDER — GUAIFENESIN-DM 100-10 MG/5ML PO SYRP
10.0000 mL | ORAL_SOLUTION | Freq: Four times a day (QID) | ORAL | Status: DC | PRN
  Filled 2020-03-21: qty 10

## 2020-03-21 MED ORDER — NYSTATIN 100000 UNIT/GM EX POWD
Freq: Three times a day (TID) | CUTANEOUS | Status: DC | PRN
  Filled 2020-03-21: qty 15

## 2020-03-21 MED ORDER — ACETAMINOPHEN 325 MG PO TABS
650.0000 mg | ORAL_TABLET | Freq: Once | ORAL | Status: AC
  Administered 2020-03-21: 650 mg via ORAL
  Filled 2020-03-21: qty 2

## 2020-03-21 MED ORDER — ONDANSETRON HCL 4 MG/2ML IJ SOLN: 4.0000 mg | Freq: Four times a day (QID) | INTRAMUSCULAR | Status: DC | PRN

## 2020-03-21 MED ORDER — POLYETHYLENE GLYCOL 3350 17 G PO PACK: 17.0000 g | PACK | Freq: Two times a day (BID) | ORAL | Status: DC | PRN

## 2020-03-21 MED ORDER — ACETAMINOPHEN 500 MG PO TABS: 1000.0000 mg | ORAL_TABLET | Freq: Three times a day (TID) | ORAL | Status: DC | PRN

## 2020-03-21 MED ORDER — DOCUSATE SODIUM 100 MG PO CAPS: 100.0000 mg | ORAL_CAPSULE | Freq: Two times a day (BID) | ORAL | Status: DC | PRN

## 2020-03-21 MED ORDER — DEXTROSE-NACL 5-0.9 % IV SOLN: INTRAVENOUS | Status: DC

## 2020-03-21 MED ORDER — CALCIUM CARBONATE ANTACID 500 MG PO CHEW: 1.0000 | CHEWABLE_TABLET | Freq: Three times a day (TID) | ORAL | Status: DC | PRN

## 2020-03-21 MED ORDER — ALUM & MAG HYDROXIDE-SIMETH 200-200-20 MG/5ML PO SUSP: 15.0000 mL | Freq: Four times a day (QID) | ORAL | Status: DC | PRN

## 2020-03-21 NOTE — Progress Notes (Signed)
Inpatient Diabetes Program Recommendations  AACE/ADA: New Consensus Statement on Inpatient Glycemic Control   Target Ranges:  Prepandial:   less than 140 mg/dL      Peak postprandial:   less than 180 mg/dL (1-2 hours)      Critically ill patients:  140 - 180 mg/dL   Results for GREER, WAINRIGHT (MRN [id number]) as of 03/21/2020 09:10  Ref. Range 03/20/2020 10:42 03/21/2020 03:38  Glucose Latest Ref Range: 70 - 99 mg/dL 47 (L) 111 (H)   Review of Glycemic Control  Diabetes history: DM2 Outpatient Diabetes medications: Glipizide 10 mg BID, Levemir 12-14 units QHS, Humalog 2 units for every 50 mg/dl over 200 mg/dl after meals Current orders for Inpatient glycemic control: None  Inpatient Diabetes Program Recommendations:   Insulin-Correction: Please consider ordering CBGs Q4H with Novolog 0-6 units Q4H.  Thanks, Barnie Alderman, RN, MSN, CDE Diabetes Coordinator Inpatient Diabetes Program [phone] (Team Pager from 8am to 5pm)

## 2020-03-21 NOTE — ED Notes (Signed)
200c of urine noted in purewick canister.

## 2020-03-21 NOTE — Progress Notes (Signed)
PROGRESS NOTE    [name] [name]  HBZ:[id number] DOB: August 18, 1929 DOA: 03/20/2020 PCP: Gayland Curry, MD    Assessment & Plan:   Principal Problem:   CAP (community acquired pneumonia) Active Problems:   Diabetes ([place])   Essential hypertension   INTERSTITIAL LUNG DISEASE   CKD (chronic kidney disease), stage IV ([place])    [name] [name] is a 86 y.o. female with medical history significant for Lewy body dementia, interstitial lung disease with chronic respiratory failure on 2 L of oxygen and Parkinson's disease who was brought into the hospital by EMS for evaluation of mental status changes.   AMS  Lewy body dementia with Parkinson features --Pt has been having rapid decline, and stopped eating and drinking for several days prior to presentation PLAN: --D5 NS@50  for 20 hours --Continue Sinemet --Continue Seroquel at bedtime --palliative consult  Hypoglycemia, POA, resolved --BG 47 on presentation.  likely due to poor PO intake. --D5 NS@50  for 20 hours  Hypothermia, POA, resolved --likely due to hypoglycemia  Community-acquired pneumonia, ruled out Patient was hypothermic when she arrived, and was placed empirically on Rocephin and Zithromax, however, CT chest showed no consolidation or signs of infection.  Procal 0.14.  Cough with brown sputum production is baseline, per son. PLAN: --d/c abx  History of interstitial lung disease with chronic respiratory failure --O2 requirement at baseline. --Continue oxygen supplementation at 2 L to maintain pulse oximetry greater than 92%  Diabetes mellitus with complications of stage IV chronic kidney disease Maintain consistent carbohydrate diet Place patient on sliding scale coverage Close monitoring of renal function  Hypertension Blood pressure is stable on multiple antihypertensive medication Continue clonidine, metoprolol and amlodipine Monitor closely for bradycardia  Hypothyroidism Continue Synthroid    DVT prophylaxis: Heparin SQ Code Status: DNR  Family Communication: sons updated at bedside today Status is: inpatient Dispo:   The patient is from: home Anticipated d/c is to: undetermined Anticipated d/c date is: undetermined Patient currently is not medically stable to d/c due to: approaching end of life, need palliative care consult and proper disposition, likely hospice.   Subjective and Interval History:  Pt answered questions when directly asked.  Pt denied pain, but did admitted to dyspnea.  No fever, N/V/D.  Pt said she doesn't want to eat or drink.    Palliative consulted today.   Objective: Vitals:   03/21/20 1630 03/21/20 1639 03/21/20 1753 03/21/20 1932  BP:   140/75   Pulse: 86 86 88   Resp:  (!) 21 16   Temp:      TempSrc:      SpO2: 97% 100% 91% 95%  Weight:      Height:        Intake/Output Summary (Last 24 hours) at 03/21/2020 2125 Last data filed at 03/21/2020 1913 Gross per 24 hour  Intake 100 ml  Output 1650 ml  Net -1550 ml   Filed Weights   03/20/20 1051  Weight: 63.5 kg    Examination:   Constitutional: NAD, alert, oriented to self HEENT: conjunctivae and lids normal, EOMI CV: RRR no M,R,G. Distal pulses +2.  No cyanosis.   RESP: loud Velcro-like crackles, normal respiratory effort, on 2L GI: +BS, NTND Extremities: No effusions, edema, or tenderness in BLE SKIN: warm.  Erythema under the groin skin fold Neuro: II - XII grossly intact.  Sensation intact   Data Reviewed: I have personally reviewed following labs and imaging studies  CBC: Recent Labs  Lab 03/20/20 1042 03/21/20 1142  WBC 9.0 9.2  NEUTROABS 4.6  --   HGB 11.1* 10.8*  HCT 35.6* 34.1*  MCV 97.3 96.9  PLT 147* 119   Basic Metabolic Panel: Recent Labs  Lab 03/20/20 1042 03/21/20 0338  NA 133* 134*  K 4.2 5.1  CL 97* 100  CO2 26 22  GLUCOSE 47* 111*  BUN 42* 28*  CREATININE 1.76* 1.27*  CALCIUM 9.9 9.2   GFR: Estimated Creatinine Clearance: 26.8 mL/min  (A) (by C-G formula based on SCr of 1.27 mg/dL (H)). Liver Function Tests: Recent Labs  Lab 03/20/20 1042  AST 28  ALT <5  ALKPHOS 52  BILITOT 0.7  PROT 8.4*  ALBUMIN 2.9*   Recent Labs  Lab 03/20/20 1042  LIPASE 25   No results for input(s): AMMONIA in the last 168 hours. Coagulation Profile: Recent Labs  Lab 03/20/20 1042  INR 1.1   Cardiac Enzymes: No results for input(s): CKTOTAL, CKMB, CKMBINDEX, TROPONINI in the last 168 hours. BNP (last 3 results) No results for input(s): PROBNP in the last 8760 hours. HbA1C: Recent Labs    03/20/20 1042  HGBA1C 6.9*   CBG: Recent Labs  Lab 03/21/20 1235  GLUCAP 185*   Lipid Profile: No results for input(s): CHOL, HDL, LDLCALC, TRIG, CHOLHDL, LDLDIRECT in the last 72 hours. Thyroid Function Tests: No results for input(s): TSH, T4TOTAL, FREET4, T3FREE, THYROIDAB in the last 72 hours. Anemia Panel: No results for input(s): VITAMINB12, FOLATE, FERRITIN, TIBC, IRON, RETICCTPCT in the last 72 hours. Sepsis Labs: Recent Labs  Lab 03/20/20 1042  PROCALCITON 0.14  LATICACIDVEN 0.7    Recent Results (from the past 240 hour(s))  Blood Culture (routine x 2)     Status: None (Preliminary result)   Collection Time: 03/20/20 10:42 AM   Specimen: BLOOD  Result Value Ref Range Status   Specimen Description BLOOD RIGHT HAND  Final   Special Requests   Final    BOTTLES DRAWN AEROBIC AND ANAEROBIC Blood Culture adequate volume   Culture   Final    NO GROWTH < 24 HOURS Performed at [facility], [address] [place], [place] [address]    Report Status PENDING  Incomplete  Blood Culture (routine x 2)     Status: None (Preliminary result)   Collection Time: 03/20/20 10:42 AM   Specimen: BLOOD  Result Value Ref Range Status   Specimen Description BLOOD RIGHT FAS  Final   Special Requests   Final    BOTTLES DRAWN AEROBIC AND ANAEROBIC Blood Culture adequate volume   Culture  Setup Time   Final    GRAM POSITIVE  COCCI Organism ID to follow AEROBIC BOTTLE ONLY CRITICAL RESULT CALLED TO, READ BACK BY AND VERIFIED WITH: SCOTT HALL AT [place] 03/21/20 [place] Performed at [facility], [address] [place], [place] [address]    Culture GRAM POSITIVE COCCI  Final   Report Status PENDING  Incomplete  Urine culture     Status: None   Collection Time: 03/20/20 10:42 AM   Specimen: In/Out Cath Urine  Result Value Ref Range Status   Specimen Description   Final    IN/OUT CATH URINE Performed at [facility], [address] [place], [place] [address]    Special Requests   Final    NONE Performed at [facility], [address] [place], [place] [address]    Culture   Final    NO GROWTH Performed at [facility], [place] [address],  [place], [place] [address]    Report Status 03/21/2020 FINAL  Final  Blood Culture ID Panel (Reflexed)     Status: Abnormal   Collection Time: 03/20/20 10:42 AM  Result Value Ref Range Status   Enterococcus species NOT DETECTED NOT DETECTED Final   Listeria monocytogenes NOT DETECTED NOT DETECTED Final   Staphylococcus species DETECTED (A) NOT DETECTED Final    Comment: Methicillin (oxacillin) susceptible coagulase negative staphylococcus. Possible blood culture contaminant (unless isolated from more than one blood culture draw or clinical case suggests pathogenicity). No antibiotic treatment is indicated for blood  culture contaminants. CRITICAL RESULT CALLED TO, READ BACK BY AND VERIFIED WITH: SCOTT HALL AT 0505 03/21/20 S[place]    Staphylococcus aureus (BCID) NOT DETECTED NOT DETECTED Final   Methicillin resistance NOT DETECTED NOT DETECTED Final   Streptococcus species NOT DETECTED NOT DETECTED Final   Streptococcus agalactiae NOT DETECTED NOT DETECTED Final   Streptococcus pneumoniae NOT DETECTED NOT DETECTED Final   Streptococcus pyogenes NOT DETECTED NOT DETECTED Final   Acinetobacter baumannii NOT DETECTED NOT DETECTED Final    Enterobacteriaceae species NOT DETECTED NOT DETECTED Final   Enterobacter cloacae complex NOT DETECTED NOT DETECTED Final   Escherichia coli NOT DETECTED NOT DETECTED Final   Klebsiella oxytoca NOT DETECTED NOT DETECTED Final   Klebsiella pneumoniae NOT DETECTED NOT DETECTED Final   Proteus species NOT DETECTED NOT DETECTED Final   Serratia marcescens NOT DETECTED NOT DETECTED Final   Haemophilus influenzae NOT DETECTED NOT DETECTED Final   Neisseria meningitidis NOT DETECTED NOT DETECTED Final   Pseudomonas aeruginosa NOT DETECTED NOT DETECTED Final   Candida albicans NOT DETECTED NOT DETECTED Final   Candida glabrata NOT DETECTED NOT DETECTED Final   Candida krusei NOT DETECTED NOT DETECTED Final   Candida parapsilosis NOT DETECTED NOT DETECTED Final   Candida tropicalis NOT DETECTED NOT DETECTED Final    Comment: Performed at [facility], [address] [place], [place] [address]  SARS Coronavirus 2 by RT PCR (hospital order, performed in [place] hospital lab) Nasopharyngeal Nasopharyngeal Swab     Status: None   Collection Time: 03/20/20 11:29 AM   Specimen: Nasopharyngeal Swab  Result Value Ref Range Status   SARS Coronavirus 2 NEGATIVE NEGATIVE Final    Comment: (NOTE) SARS-CoV-2 target nucleic acids are NOT DETECTED. The SARS-CoV-2 RNA is generally detectable in upper and lower respiratory specimens during the acute phase of infection. The lowest concentration of SARS-CoV-2 viral copies this assay can detect is 250 copies / mL. A negative result does not preclude SARS-CoV-2 infection and should not be used as the sole basis for treatment or other patient management decisions.  A negative result may occur with improper specimen collection / handling, submission of specimen other than nasopharyngeal swab, presence of viral mutation(s) within the areas targeted by this assay, and inadequate number of viral copies (<250 copies / mL). A negative result must be  combined with clinical observations, patient history, and epidemiological information. Fact Sheet for Patients:   StrictlyIdeas.no Fact Sheet for Healthcare Providers: BankingDealers.co.za This test is not yet approved or cleared  by the [place] FDA and has been authorized for detection and/or diagnosis of SARS-CoV-2 by FDA under an Emergency Use Authorization (EUA).  This EUA will remain in effect (meaning this test can be used) for the duration of the COVID-19 declaration under Section 564(b)(1) of the Act, 21 U.S.C. section 360bbb-3(b)(1), unless the authorization is terminated or revoked sooner. Performed at [facility], [phone] [place], [place] [address]       Radiology Studies: CT CHEST WO CONTRAST  Result Date: 03/20/2020 CLINICAL DATA:  Altered level of consciousness for 2 days, productive cough, sepsis EXAM: CT CHEST WITHOUT CONTRAST TECHNIQUE: Multidetector CT imaging of the chest was performed following the standard protocol without IV contrast. COMPARISON:  03/20/2020, 01/29/2007 FINDINGS: Cardiovascular: The heart is not enlarged. There is extensive calcification of the mitral annulus. Aortic valve prosthesis is identified. Extensive atherosclerosis of the coronary vessels. Abated epicardial pacing wires are noted. The aorta is normal in caliber, with extensive atherosclerosis identified. There is dilatation of the main pulmonary artery compatible with pulmonary arterial hypertension. Mediastinum/Nodes: There is progressive mediastinal lymphadenopathy. Largest lymph node in the precarinal region measures 2.7 cm in short axis, previously having measured 2.1 cm. Thyroid is unremarkable. Trachea and esophagus are normal. Lungs/Pleura: There is extensive parenchymal scarring and fibrosis throughout the lungs. Mild diffuse interlobular septal thickening suggest an element of interstitial edema. There is no focal  consolidation to suggest pneumonia or edema. No effusion or pneumothorax. Central airways are patent. Upper Abdomen: No acute abnormality. Musculoskeletal: No acute or destructive bony lesions. Reconstructed images demonstrate no additional findings. Stable left shoulder arthroplasty. IMPRESSION: 1. Progressive lung scarring and fibrosis. Superimposed interlobular septal thickening consistent with interstitial edema. 2. Progressive mediastinal lymphadenopathy, nonspecific. 3. Aortic Atherosclerosis (ICD10-I70.0) and Emphysema (ICD10-J43.9). 4. Extensive coronary artery atherosclerosis and mitral annular calcification. 5. Enlarged pulmonary artery consistent with pulmonary arterial hypertension. Electronically Signed   By: Randa Ngo M.D.   On: 03/20/2020 15:30   DG Chest Port 1 View  Result Date: 03/20/2020 CLINICAL DATA:  Diffuse crackles with shortness of breath EXAM: PORTABLE CHEST 1 VIEW COMPARISON:  12/11/2019 FINDINGS: Cardiomediastinal contours are stable, partially obscured by diffuse interstitial and airspace opacities. Increasing interstitial marking since the prior study. No areas of dense consolidative change. The no sign of pleural effusion. Signs of LEFT shoulder arthroplasty. Postoperative changes over the chest related to prior valvular replacement. IMPRESSION: Increasing interstitial marking since the prior study may represent superimposed edema or atypical infection, on a background of interstitial lung disease. Electronically Signed   By: Zetta Bills M.D.   On: 03/20/2020 11:17     Scheduled Meds: . allopurinol  300 mg Oral Daily  . amLODipine  2.5 mg Oral Daily  . aspirin EC  81 mg Oral Daily  . carbidopa-levodopa  1 tablet Oral TID  . Carbidopa-Levodopa ER  1 tablet Oral BID  . cholecalciferol  1,000 Units Oral Q1200  . famotidine  20 mg Oral Daily  . furosemide  40 mg Oral Daily  . gabapentin  100 mg Oral TID  . heparin  5,000 Units Subcutaneous Q8H  . levothyroxine   100 mcg Oral Q0600  . metoprolol succinate  100 mg Oral BID  . multivitamin with minerals  1 tablet Oral Q1200  . omega-3 acid ethyl esters  1 g Oral Q1200  . pravastatin  80 mg Oral QHS  . QUEtiapine  25 mg Oral QHS  . rOPINIRole  1 mg Oral TID  . triamcinolone cream  1 application Topical BID  . vitamin B-12  1,000 mcg Oral Q1200   Continuous Infusions: . azithromycin Stopped (03/21/20 1440)  . cefTRIAXone (ROCEPHIN)  IV Stopped (03/21/20 1405)  . dextrose 5 % and 0.9% NaCl 50 mL/hr at 03/21/20 1623     LOS: 1 day     Enzo Bi, MD [facility] If 7PM-7AM, please contact night-coverage  03/21/2020, 9:25 PM

## 2020-03-21 NOTE — ED Notes (Signed)
Pt meds delivered by pharmacy - per reports pt is NPO until speech therapy can clear d/t aspiration

## 2020-03-21 NOTE — ED Notes (Signed)
Pt son has been with pt all night d/t dementia and trying to get out of bed - Pt son needs a break and reports that other brother can relieve if approved - swap approved and charge RN in agreement

## 2020-03-21 NOTE — Evaluation (Signed)
Clinical/Bedside Swallow Evaluation Patient Details  Name: [name] [name] MRN: [id number] Date of Birth: 06/14/1929  Today's Date: 03/21/2020 Time: SLP Start Time (ACUTE ONLY): 0943 SLP Stop Time (ACUTE ONLY): 1004 SLP Time Calculation (min) (ACUTE ONLY): 21 min  Past Medical History:  Past Medical History:  Diagnosis Date  . Diabetes mellitus without complication ([place])   . Hypertension   . Thyroid disease    Past Surgical History:  Past Surgical History:  Procedure Laterality Date  . ABDOMINAL HYSTERECTOMY    . BREAST BIOPSY Right 20+yrs ago   negative   HPI:  [name] [name] is a 86 y.o. female with medical history significant for Lewy body dementia, interstitial lung disease with chronic respiratory failure on 2 L of oxygen and Parkinson's disease who was brought into the hospital by EMS for evaluation of mental status changes associated with a cough productive of brown phlegm.  She was hypothermic upon arrival to the ER and on 2 L had a pulse oximetry of 98%.   Assessment / Plan / Recommendation Clinical Impression  Pt presents with adequate oropharyngeal abilities when consuming regular trials (saltine with peanut butter) and thin liquids via cup or straw. Pt is however pleasantly confused d/t underlying dementia. As such, despite being NPO for > 24 hours pt consumed minimal trials. As such pt is at risk for malnutrition and dehydration as her diease progress. In an effort to help with ease of consumption recommend dysphagia 3 diet with thin liquids via straw. To further compensate for pt's confusion, pilled crushed in puree is likely the best method for medication administration. Education provided to pt's son with all questions answered to his satisfaction. At this time, pt doesn't demonstrate any evidence of oropharyngeal dysphagia. ST to sign off and can be re-consulted should pt's condition change.  SLP Visit Diagnosis: Dysphagia, unspecified (R13.10)    Aspiration Risk   Mild aspiration risk    Diet Recommendation   Dysphagia 3 with thin liquids via cup or straw  Medication Administration: Crushed with puree(d/t cognitive deficits)    Other  Recommendations Oral Care Recommendations: Oral care BID   Follow up Recommendations None      Frequency and Duration  N/A          Prognosis   N/A     Swallow Study   General Date of Onset: 03/20/20 HPI: [name] [name] is a 86 y.o. female with medical history significant for Lewy body dementia, interstitial lung disease with chronic respiratory failure on 2 L of oxygen and Parkinson's disease who was brought into the hospital by EMS for evaluation of mental status changes associated with a cough productive of brown phlegm.  She was hypothermic upon arrival to the ER and on 2 L had a pulse oximetry of 98%. Type of Study: Bedside Swallow Evaluation Previous Swallow Assessment: none in chart Diet Prior to this Study: NPO Temperature Spikes Noted: No Respiratory Status: Room air History of Recent Intubation: No Behavior/Cognition: Alert;Pleasant mood;Confused;Distractible;Requires cueing;Doesn't follow directions Oral Cavity Assessment: Within Functional Limits Oral Care Completed by SLP: No Oral Cavity - Dentition: Adequate natural dentition Self-Feeding Abilities: Total assist(d/t cognitive deficits) Patient Positioning: Upright in bed Baseline Vocal Quality: Normal Volitional Cough: Cognitively unable to elicit Volitional Swallow: Unable to elicit    Oral/Motor/Sensory Function Overall Oral Motor/Sensory Function: Within functional limits(assessed within functional context of PO intake)   Ice Chips Ice chips: Not tested   Thin Liquid Thin Liquid: Within functional limits Presentation: Cup;Straw(SLP administered)  Nectar Thick Nectar Thick Liquid: Not tested   Honey Thick Honey Thick Liquid: Not tested   Puree Puree: Not tested   Solid    [name] [name] B. Rutherford Nail M.S., CCC-SLP,  CBIS Speech-Language Pathologist Rehabilitation Services Office [phone]  Solid: Within functional limits Presentation: (SLP fed saltine with peanut butter)      [name] [name] 03/21/2020,3:12 PM

## 2020-03-21 NOTE — ED Notes (Signed)
Son remains at bedside with pt. Bed locked low. Rails up. Call bell within reach.

## 2020-03-21 NOTE — ED Notes (Signed)
All PO meds held until ST can clear

## 2020-03-21 NOTE — Progress Notes (Signed)
PHARMACY - PHYSICIAN COMMUNICATION CRITICAL VALUE ALERT - BLOOD CULTURE IDENTIFICATION (BCID)  [name] [name] is an 86 y.o. female who presented to [facility] on 03/20/2020 with a chief complaint of sepsis  Assessment:  Lab reports 1 of 4 bottles + for GPC, Staph, MecA not detected  Name of physician (or Provider) Contacted: Dr Sidney Ace  Current antibiotics: Rocephin 2gm IV q24hrs  Changes to prescribed antibiotics recommended: consider Ancef per protocol Continue current Rx for now and monitor  Results for orders placed or performed during the hospital encounter of 03/20/20  Blood Culture ID Panel (Reflexed) (Collected: 03/20/2020 10:42 AM)  Result Value Ref Range   Enterococcus species NOT DETECTED NOT DETECTED   Listeria monocytogenes NOT DETECTED NOT DETECTED   Staphylococcus species DETECTED (A) NOT DETECTED   Staphylococcus aureus (BCID) NOT DETECTED NOT DETECTED   Methicillin resistance NOT DETECTED NOT DETECTED   Streptococcus species NOT DETECTED NOT DETECTED   Streptococcus agalactiae NOT DETECTED NOT DETECTED   Streptococcus pneumoniae NOT DETECTED NOT DETECTED   Streptococcus pyogenes NOT DETECTED NOT DETECTED   Acinetobacter baumannii NOT DETECTED NOT DETECTED   Enterobacteriaceae species NOT DETECTED NOT DETECTED   Enterobacter cloacae complex NOT DETECTED NOT DETECTED   Escherichia coli NOT DETECTED NOT DETECTED   Klebsiella oxytoca NOT DETECTED NOT DETECTED   Klebsiella pneumoniae NOT DETECTED NOT DETECTED   Proteus species NOT DETECTED NOT DETECTED   Serratia marcescens NOT DETECTED NOT DETECTED   Haemophilus influenzae NOT DETECTED NOT DETECTED   Neisseria meningitidis NOT DETECTED NOT DETECTED   Pseudomonas aeruginosa NOT DETECTED NOT DETECTED   Candida albicans NOT DETECTED NOT DETECTED   Candida glabrata NOT DETECTED NOT DETECTED   Candida krusei NOT DETECTED NOT DETECTED   Candida parapsilosis NOT DETECTED NOT DETECTED   Candida tropicalis NOT DETECTED  NOT DETECTED    Hart Robinsons A 03/21/2020  5:25 AM

## 2020-03-22 DIAGNOSIS — J8410 Pulmonary fibrosis, unspecified: Secondary | ICD-10-CM

## 2020-03-22 DIAGNOSIS — Z66 Do not resuscitate: Secondary | ICD-10-CM

## 2020-03-22 DIAGNOSIS — F03C0 Unspecified dementia, severe, without behavioral disturbance, psychotic disturbance, mood disturbance, and anxiety: Secondary | ICD-10-CM | POA: Diagnosis present

## 2020-03-22 DIAGNOSIS — G20 Parkinson's disease: Secondary | ICD-10-CM

## 2020-03-22 DIAGNOSIS — Z515 Encounter for palliative care: Secondary | ICD-10-CM

## 2020-03-22 DIAGNOSIS — F0390 Unspecified dementia without behavioral disturbance: Secondary | ICD-10-CM

## 2020-03-22 DIAGNOSIS — J9611 Chronic respiratory failure with hypoxia: Secondary | ICD-10-CM

## 2020-03-22 LAB — BASIC METABOLIC PANEL
Anion gap: 10 (ref 5–15)
BUN: 28 mg/dL — ABNORMAL HIGH (ref 8–23)
CO2: 27 mmol/L (ref 22–32)
Calcium: 9.4 mg/dL (ref 8.9–10.3)
Chloride: 99 mmol/L (ref 98–111)
Creatinine, Ser: 1.14 mg/dL — ABNORMAL HIGH (ref 0.44–1.00)
GFR calc Af Amer: 50 mL/min — ABNORMAL LOW (ref >60)
GFR calc non Af Amer: 43 mL/min — ABNORMAL LOW (ref >60)
Glucose, Bld: 195 mg/dL — ABNORMAL HIGH (ref 70–99)
Potassium: 4.5 mmol/L (ref 3.5–5.1)
Sodium: 136 mmol/L (ref 135–145)

## 2020-03-22 LAB — CBC
HCT: 33.4 % — ABNORMAL LOW (ref 36.0–46.0)
Hemoglobin: 10.9 g/dL — ABNORMAL LOW (ref 12.0–15.0)
MCH: 30.5 pg (ref 26.0–34.0)
MCHC: 32.6 g/dL (ref 30.0–36.0)
MCV: 93.6 fL (ref 80.0–100.0)
Platelets: 142 10*3/uL — ABNORMAL LOW (ref 150–400)
RBC: 3.57 MIL/uL — ABNORMAL LOW (ref 3.87–5.11)
RDW: 17.5 % — ABNORMAL HIGH (ref 11.5–15.5)
WBC: 8.5 10*3/uL (ref 4.0–10.5)
nRBC: 0 % (ref 0.0–0.2)

## 2020-03-22 LAB — CULTURE, BLOOD (ROUTINE X 2): Special Requests: ADEQUATE

## 2020-03-22 LAB — GLUCOSE, CAPILLARY
Glucose-Capillary: 190 mg/dL — ABNORMAL HIGH (ref 70–99)
Glucose-Capillary: 182 mg/dL — ABNORMAL HIGH (ref 70–99)

## 2020-03-22 LAB — PROCALCITONIN: Procalcitonin: 0.16 ng/mL

## 2020-03-22 LAB — MAGNESIUM: Magnesium: 1.7 mg/dL (ref 1.7–2.4)

## 2020-03-22 MED ORDER — GLYCOPYRROLATE 0.2 MG/ML IJ SOLN
0.2000 mg | INTRAMUSCULAR | Status: DC | PRN
  Filled 2020-03-22: qty 1

## 2020-03-22 MED ORDER — MORPHINE SULFATE (PF) 2 MG/ML IV SOLN
0.5000 mg | INTRAVENOUS | Status: DC | PRN
  Administered 2020-03-26: 0.5 mg via INTRAVENOUS
  Filled 2020-03-22: qty 1

## 2020-03-22 MED ORDER — HALOPERIDOL LACTATE 5 MG/ML IJ SOLN: 2.0000 mg | Freq: Four times a day (QID) | INTRAMUSCULAR | Status: DC | PRN

## 2020-03-22 MED ORDER — LORAZEPAM 2 MG/ML IJ SOLN
0.5000 mg | Freq: Four times a day (QID) | INTRAMUSCULAR | Status: DC | PRN
  Administered 2020-03-26 (×2): 0.5 mg via INTRAVENOUS
  Filled 2020-03-22 (×2): qty 1

## 2020-03-22 MED ORDER — BISACODYL 10 MG RE SUPP: 10.0000 mg | Freq: Every day | RECTAL | Status: DC | PRN

## 2020-03-22 MED ORDER — SODIUM CHLORIDE 0.9 % IV SOLN: INTRAVENOUS | Status: DC

## 2020-03-22 MED ORDER — FUROSEMIDE 10 MG/ML IJ SOLN
40.0000 mg | Freq: Once | INTRAMUSCULAR | Status: AC
  Administered 2020-03-22: 40 mg via INTRAVENOUS
  Filled 2020-03-22: qty 4

## 2020-03-22 MED ORDER — MORPHINE SULFATE (CONCENTRATE) 10 MG/0.5ML PO SOLN
5.0000 mg | ORAL | Status: DC | PRN
  Administered 2020-03-26 – 2020-03-28 (×4): 5 mg via SUBLINGUAL
  Filled 2020-03-22 (×4): qty 0.5

## 2020-03-22 MED ORDER — FUROSEMIDE 10 MG/ML IJ SOLN: 40.0000 mg | Freq: Once | INTRAMUSCULAR | Status: DC

## 2020-03-22 NOTE — Progress Notes (Signed)
Rapid response RN rounding on floor and checked on patient per charge RN and patient's RN request. Patient alert and calm at bedside, with two sons at bedside. Lungs with fine crackles but respirations unlabored. Patient did report shortness of breath but stated she always had shortness of breath. MD already ordered lasix. Patient's son reported that patient's lungs always have crackles ("her primary doctor teases her she always sounds like she has rice crispies in there") and that she has been coughing hard enough that she vomits for years. Currently patient's RN planning to give lasix soon and knows to call rapid response RN if needed.

## 2020-03-22 NOTE — Consult Note (Addendum)
Consultation Note Date: 03/22/2020   Patient Name: [name] [name]  DOB: 1929/06/19  MRN: [id number]  Age / Sex: 86 y.o., female  PCP: Gayland Curry, MD Referring Physician: Enzo Bi, MD  Reason for Consultation: Establishing goals of care  HPI/Patient Profile: 86 y.o. female  with past medical history of Parkinson's disease, Lewy body dementia, chronic respiratory failure from ILD on 2L nasal cannula, hypertension, DM, thyroid disease admitted on 03/20/2020 with altered mental status and poor oral intake for several days. Community-acquired pneumonia ruled out with CT chest revealing no consolidation or signs of infection. Antibiotics discontinued. DNR code status. IVF on hold and lasix 66m IV x1 ordered 5/28 AM with worsening crackles on auscultation. Palliative medicine consultation for goals of care.   Clinical Assessment and Goals of Care:  I have reviewed medical records, discussed with Dr. LBillie Ruddyand RN, and met with patient and sons. Patient resting during visit with baseline dementia. No s/s of pain or discomfort. Poor oral intake. Only accepting sips of water.   Met with three sons ([name] [name] [name] [name] and [name] [name] in family conference room to discuss goals of care.   I introduced Palliative Medicine as specialized medical care for people living with serious illness. It focuses on providing relief from the symptoms and stress of a serious illness.   We discussed a brief life review of the patient. Widowed. Three adult sons. [name] Landmarkhas lived with her since 2006. She was lonely following the loss of her husband a few years prior. She has been on oxygen for years. Parkinson's disease diagnosis approximately 2 years ago. Bill reports worsening symptoms of dementia since January but especially in the last two months.  Discussed events leading up to admission and course of hospitalization including diagnoses,  interventions, plan of care.  I attempted to elicit values and goals of care important to the patient and son. Advanced directives, concepts specific to code status, artifical feeding and hydration, and rehospitalization were considered and discussed. All three sons are very clear on their mother's previously stated wishes against life-prolonging intervention including resuscitation or feeding tube. Sons confirm DNR code status. [name] [name] [name] Landmarkeven mentioned feeding tube to his mother and he feels she was very lucid when she immediately said "NO!" Medically recommended against heroic measures including feeding tube with underlying irreversible, progressive conditions and this just unfortunately prolonging EOL.   [name] Comoshares that his mother has shared multiple times that she is "ready" and questioned how she has lived this long. Sons agree that she is ready and at peace with EOL.   The difference between aggressive medical intervention and comfort care was considered in light of the patient's goals of care.   Discussed hospice philosophy and options in detail, emphasizing focus on comfort, symptom management, and peace/dignity at EOL. Sons prefer hospice facility placement. Frankly and compassionately discussed poor prognosis weeks if not days with very poor oral intake, essentially nothing. Discussed transition to comfort measures only including discontinuation of interventions not aimed at comfort. Discussed comfort feeds and visitor  policy. Discussed symptom management medications. Sons are ready and agree with shift to comfort and hospice facility referral.   Answered all questions and concerns. PMT contact information and Hard Choices booklet given for review.   **Updated Dr. Billie Ruddy, RN, and SW on transition to comfort.     SUMMARY OF RECOMMENDATIONS    GOC discussion with patient's three sons [name] [name], [name] [name], and Legrand [name]).   Patient has previously expressed her wishes to sons. DNR/DNI, NO feeding  tube. Patient does not wish for life-prolonging interventions.   Sons are ready for transition to comfort measures only, understanding diagnoses and poor prognosis. Sons understand interventions not aimed at comfort will be discontinued.   Symptom management--see below  Comfort feeds per patient/family request. Continue dysphagia diet. Aspiration precautions.   Continue frequent repositioning and oral care for comfort.   Spiritual care as needed for support.  Unrestricted visitor access. Patient is nearing EOL.   TOC team notified. Sons request residential hospice placement. [facility].   Code Status/Advance Care Planning:  DNR  Symptom Management:   Roxanol 65m SL q4h prn moderate pain/dyspnea/air hunger/tachypnea  Morphine 0.515mIV q4h prn severe pain/dyspnea/air hunger/tachypnea (Per sons, patient is sensitive to opioids and may become nauseous. May need prn zofran if morphine needs to be given).   Zofran 63m78mV q6h prn nausea/vomiting  Ativan 0.5mg22m q6h prn anxiety  Haldol 2mg 263mq6h prn agitation  Robinul 0.2mg I59m4h prn secretions  Dulcolax suppository daily prn constipation  Palliative Prophylaxis:   Aspiration, Delirium Protocol, Frequent Pain Assessment, Oral Care and Turn Reposition  Psycho-social/Spiritual:   Desire for further Chaplaincy support: yes  Additional Recommendations: Caregiving  Support/Resources, Compassionate Wean Education and Education on Hospice  Prognosis:   Poor prognosis: <2 weeks if not days once life-prolonging interventions are discontinued with adult failure to thrive, underlying Parkinson's disease and worsening dementia with poor functional/cogntive status and essentially no oral intake. Chronic respiratory failure with ILD.   Discharge Planning: Hospice facility      Primary Diagnoses: Present on Admission: . INTERSTITIAL LUNG DISEASE . Essential hypertension . CAP (community acquired pneumonia) . CKD  (chronic kidney disease), stage IV (HCC) .ChapmanR (do not resuscitate)   I have reviewed the medical record, interviewed the patient and family, and examined the patient. The following aspects are pertinent.  Past Medical History:  Diagnosis Date  . Diabetes mellitus without complication (HCC)  QuentinHypertension   . Thyroid disease    Social History   Socioeconomic History  . Marital status: Married    Spouse name: Not on file  . Number of children: Not on file  . Years of education: Not on file  . Highest education level: Not on file  Occupational History  . Not on file  Tobacco Use  . Smoking status: Never Smoker  . Smokeless tobacco: Never Used  Substance and Sexual Activity  . Alcohol use: No  . Drug use: Not on file  . Sexual activity: Not on file  Other Topics Concern  . Not on file  Social History Narrative  . Not on file   Social Determinants of Health   Financial Resource Strain:   . Difficulty of Paying Living Expenses:   Food Insecurity:   . Worried About RunninCharity fundraisere Last Year:   . Ran OuArboriculturiste Last Year:   Transportation Needs:   . Lack oFilm/video editorcal):   . LackMarland Kitchenof Transportation (Non-Medical):  Physical Activity:   . Days of Exercise per Week:   . Minutes of Exercise per Session:   Stress:   . Feeling of Stress :   Social Connections:   . Frequency of Communication with Friends and Family:   . Frequency of Social Gatherings with Friends and Family:   . Attends Religious Services:   . Active Member of Clubs or Organizations:   . Attends Archivist Meetings:   Marland Kitchen Marital Status:    Family History  Problem Relation Age of Onset  . Breast cancer Mother 42   Scheduled Meds: . carbidopa-levodopa  1 tablet Oral TID  . Carbidopa-Levodopa ER  1 tablet Oral BID  . gabapentin  100 mg Oral TID  . QUEtiapine  25 mg Oral QHS  . rOPINIRole  1 mg Oral TID  . triamcinolone cream  1 application Topical BID    Continuous Infusions: PRN Meds:.acetaminophen, alum & mag hydroxide-simeth, bisacodyl, calcium carbonate, glycopyrrolate, guaiFENesin-dextromethorphan, haloperidol lactate, LORazepam, morphine injection, morphine CONCENTRATE, nystatin, ondansetron (ZOFRAN) IV, ondansetron Medications Prior to Admission:  Prior to Admission medications   Medication Sig Start Date End Date Taking? Authorizing Provider  allopurinol (ZYLOPRIM) 300 MG tablet Take 300 mg by mouth daily. 06/14/16  Yes [provider]  amLODipine (NORVASC) 2.5 MG tablet Take 2.5 mg by mouth daily. 02/18/20  Yes [provider]  aspirin EC 81 MG tablet Take 81 mg by mouth daily.   Yes [provider]  carbidopa-levodopa (SINEMET IR) 25-100 MG tablet Take 1 tablet by mouth in the morning and at bedtime.  03/07/20 06/05/20 Yes [provider]  Carbidopa-Levodopa ER (SINEMET CR) 25-100 MG tablet controlled release Take 1 tablet by mouth at bedtime.  02/15/20  Yes [provider]  cholecalciferol (VITAMIN D) 1000 units tablet Take 1,000 Units by mouth daily at 12 noon.   Yes [provider]  cloNIDine (CATAPRES) 0.1 MG tablet Take 0.1 mg by mouth 3 (three) times daily.  06/16/16  Yes [provider]  furosemide (LASIX) 40 MG tablet Take 1 tablet (40 mg total) by mouth daily. Patient taking differently: Take 20 mg by mouth 2 days. Sunday, Tuesday Thursday, Saturday 07/23/16  Yes Gladstone Lighter, MD  gabapentin (NEURONTIN) 100 MG capsule Take 100 mg by mouth 3 (three) times daily. 06/14/16  Yes [provider]  glipiZIDE (GLUCOTROL) 10 MG tablet Take 10 mg by mouth 2 (two) times daily before a meal.  06/14/16  Yes [provider]  insulin detemir (LEVEMIR) 100 UNIT/ML injection Inject 12-14 Units into the skin at bedtime.    Yes [provider]  insulin lispro (HUMALOG) 100 UNIT/ML injection Inject 2 Units into the skin daily as needed. Use 2 units for every 50  glucometer points over 200 with your after meal glucose checks   Yes [provider]  levothyroxine (SYNTHROID, LEVOTHROID) 100 MCG tablet Take 100 mcg by mouth daily. 06/14/16  Yes [provider]  metoprolol succinate (TOPROL-XL) 100 MG 24 hr tablet Take 1 tablet (100 mg total) by mouth 2 (two) times daily. 07/22/16  Yes Gladstone Lighter, MD  Multiple Vitamin (MULTIVITAMIN WITH MINERALS) TABS tablet Take 1 tablet by mouth daily at 12 noon.   Yes [provider]  omega-3 acid ethyl esters (LOVAZA) 1 g capsule Take 1 g by mouth daily at 12 noon.   Yes [provider]  pravastatin (PRAVACHOL) 80 MG tablet Take 80 mg by mouth at bedtime. 11/08/15  Yes [provider]  QUEtiapine (SEROQUEL) 25 MG tablet Take 25 mg by mouth at bedtime. 03/07/20  Yes [provider]  ranitidine (ZANTAC) 150 MG tablet Take 150 mg by mouth at bedtime.   Yes [provider]  rOPINIRole (REQUIP) 0.5 MG tablet One to Two pills by mouth at bedtime. 11/08/15  Yes [provider]  triamcinolone cream (KENALOG) 0.1 % Apply 1 application topically 2 (two) times daily. 07/07/16  Yes [provider]  vitamin B-12 (CYANOCOBALAMIN) 1000 MCG tablet Take 1,000 mcg by mouth daily at 12 noon.   Yes [provider]   Allergies  Allergen Reactions  . Fluoxetine     Other reaction(s): Other (See Comments) Drowsy-wanted to sleep all day  . Ace Inhibitors Cough and Other (See Comments)     hyperkalemia  hyperkalemia   . Codeine Nausea Only  . Sulfonamide Derivatives Other (See Comments)    Reaction: unknown   Review of Systems  Unable to perform ROS: Dementia   Physical Exam Vitals and nursing note reviewed.  Constitutional:      Appearance: She is ill-appearing.  HENT:     Head: Normocephalic and atraumatic.  Cardiovascular:     Heart sounds: Normal heart sounds.  Pulmonary:     Effort: No tachypnea, accessory muscle usage or respiratory  distress.     Comments: Bilateral crackles, no dyspnea  Skin:    Findings: Ecchymosis present.  Neurological:     Mental Status: She is easily aroused.     Comments: Open eyes to voice. Baseline dementia  Psychiatric:        Attention and Perception: She is inattentive.        Cognition and Memory: Cognition is impaired.     Vital Signs: BP (!) 161/80 (BP Location: Right Leg)   Pulse 100   Temp 98.3 F (36.8 C) (Oral)   Resp (!) 24   Ht _0  (1.575 m)   Wt 63.5 kg   SpO2 91%   BMI 25.61 kg/m  Pain Scale: PAINAD POSS *See Group Information*: 1-Acceptable,Awake and alert Pain Score: 0-No pain   SpO2: SpO2: 91 % O2 Device:SpO2: 91 % O2 Flow Rate: .O2 Flow Rate (L/min): 4 L/min  IO: Intake/output summary:   Intake/Output Summary (Last 24 hours) at 03/22/2020 1506 Last data filed at 03/22/2020 1300 Gross per 24 hour  Intake 264.72 ml  Output 550 ml  Net -285.28 ml    LBM:   Baseline Weight: Weight: 63.5 kg Most recent weight: Weight: 63.5 kg     Palliative Assessment/Data: PPS 20%   Flowsheet Rows     Most Recent Value  Intake Tab  Referral Department  Hospitalist  Unit at Time of Referral  Cardiac/Telemetry Unit  Palliative Care Primary Diagnosis  Neurology  Palliative Care Type  New Palliative care  Reason for referral  Clarify Goals of Care  Date first seen by Palliative Care  03/22/20  Clinical Assessment  Palliative Performance Scale Score  20%  Psychosocial & Spiritual Assessment  Palliative Care Outcomes  Patient/Family meeting held?  Yes  Who was at the meeting?  three sons [name] [name], Coralee North)  Palliative Care Outcomes  Improved pain interventions, Improved non-pain symptom therapy, Clarified goals of care, Counseled regarding hospice, Provided end of life care assistance, Provided psychosocial or spiritual support, Changed to focus on comfort, Transitioned to hospice, ACP counseling assistance      Time In: 1200 Time Out: 1320 Time Total:  45mn Greater than 50%  of this time was  spent counseling and coordinating care related to the above assessment and plan.  Signed by:  Ihor Dow, DNP, FNP-C Palliative Medicine Team  Phone: [phone] Fax: [phone]   Please contact Palliative Medicine Team phone at [phone] for questions and concerns.  For individual provider: See Shea Evans

## 2020-03-22 NOTE — Progress Notes (Signed)
Pt resting comfortably. Pt's son at bedside.

## 2020-03-22 NOTE — Progress Notes (Signed)
Follow up call to [place] about patient's condition, patient is now comfort care.

## 2020-03-22 NOTE — Progress Notes (Addendum)
PROGRESS NOTE    [name] [name]  YSA:[id number] DOB: 11/28/1928 DOA: 03/20/2020 PCP: Gayland Curry, MD    Assessment & Plan:   Principal Problem:   CAP (community acquired pneumonia) Active Problems:   Diabetes ([place])   Essential hypertension   INTERSTITIAL LUNG DISEASE   CKD (chronic kidney disease), stage IV ([place])   Terminal care   Chronic respiratory failure with hypoxia ([place])   Advanced dementia ([place])   Parkinson's disease ([place])   Palliative care by specialist   DNR (do not resuscitate)    [name] [name] is a 86 y.o. female with medical history significant for Lewy body dementia, interstitial lung disease with chronic respiratory failure on 2 L of oxygen and Parkinson's disease who was brought into the hospital by EMS for evaluation of mental status changes.   AMS  Lewy body dementia with Parkinson features End of life --Pt has been having rapid decline, and stopped eating and drinking for several days prior to presentation PLAN: --d/c MIVF --Continue Sinemet --Continue Seroquel at bedtime --palliative consult today, pt made comfort care  Hypoglycemia, POA, resolved --BG 47 on presentation.  likely due to poor PO intake. --s/p D5 NS@50   Hypothermia, POA, resolved --likely due to hypoglycemia  Community-acquired pneumonia, ruled out Patient was hypothermic when she arrived, and was placed empirically on Rocephin and Zithromax, however, CT chest showed no consolidation or signs of infection.  Procal 0.14.  Cough with brown sputum production is baseline, per son. PLAN: --d/c abx  History of interstitial lung disease with chronic respiratory failure --O2 requirement at baseline. --Continue oxygen supplementation at 2 L to maintain pulse oximetry greater than 92%  Diabetes mellitus with complications of stage IV chronic kidney disease  Hypertension D/c BP meds now that pt is comfort care  Hypothyroidism D/c Synthroid now that pt is comfrot  care.   DVT prophylaxis: None:Comfort Care Code Status: DNR comfort care Family Communication: sons updated at bedside today Status is: inpatient Dispo:   The patient is from: home Anticipated d/c is to: hospice facility Anticipated d/c date is: when bed available Patient currently is not medically stable to d/c due to: approaching end of life, need to be discharged to hospice facility (family not able to provide care at home).   Subjective and Interval History:  Pt denied pain.  Dyspnea improved.  Still not wanting to eat or drink.  No fever.  Noted to be "spitting up" brown vomitus with cough.   Palliative care consulted, and pt was made comfort care.   Objective: Vitals:   03/22/20 0422 03/22/20 0802 03/22/20 0827 03/22/20 1140  BP: (!) 152/66 (!) 164/75 (!) 158/74 (!) 161/80  Pulse: (!) 110 (!) 102 (!) 102 100  Resp: 20 (!) 26  (!) 24  Temp: 98.5 F (36.9 C) 98.1 F (36.7 C)  98.3 F (36.8 C)  TempSrc: Oral Oral  Oral  SpO2: 91% 91% 94% 91%  Weight:      Height:        Intake/Output Summary (Last 24 hours) at 03/22/2020 1653 Last data filed at 03/22/2020 1300 Gross per 24 hour  Intake 264.72 ml  Output 550 ml  Net -285.28 ml   Filed Weights   03/20/20 1051  Weight: 63.5 kg    Examination:   Constitutional: NAD, alert, oriented to self, responsive to simple questions, sitting up in bed HEENT: conjunctivae and lids normal, EOMI CV: RRR 3+ systolic murmur at upper sternal boarder. Distal pulses +2.  No cyanosis.  RESP: loud Velcro-like crackles, normal respiratory effort, on 2L GI: +BS, NTND Extremities: No effusions, edema, or tenderness in BLE SKIN: warm.  Erythema under the groin skin fold.  Extensive bruising, thin skin.   Data Reviewed: I have personally reviewed following labs and imaging studies  CBC: Recent Labs  Lab 03/20/20 1042 03/21/20 1142 03/22/20 0513  WBC 9.0 9.2 8.5  NEUTROABS 4.6  --   --   HGB 11.1* 10.8* 10.9*  HCT 35.6* 34.1*  33.4*  MCV 97.3 96.9 93.6  PLT 147* 170 742*   Basic Metabolic Panel: Recent Labs  Lab 03/20/20 1042 03/21/20 0338 03/22/20 0513  NA 133* 134* 136  K 4.2 5.1 4.5  CL 97* 100 99  CO2 26 22 27   GLUCOSE 47* 111* 195*  BUN 42* 28* 28*  CREATININE 1.76* 1.27* 1.14*  CALCIUM 9.9 9.2 9.4  MG  --   --  1.7   GFR: Estimated Creatinine Clearance: 29.9 mL/min (A) (by C-G formula based on SCr of 1.14 mg/dL (H)). Liver Function Tests: Recent Labs  Lab 03/20/20 1042  AST 28  ALT <5  ALKPHOS 52  BILITOT 0.7  PROT 8.4*  ALBUMIN 2.9*   Recent Labs  Lab 03/20/20 1042  LIPASE 25   No results for input(s): AMMONIA in the last 168 hours. Coagulation Profile: Recent Labs  Lab 03/20/20 1042  INR 1.1   Cardiac Enzymes: No results for input(s): CKTOTAL, CKMB, CKMBINDEX, TROPONINI in the last 168 hours. BNP (last 3 results) No results for input(s): PROBNP in the last 8760 hours. HbA1C: Recent Labs    03/20/20 1042  HGBA1C 6.9*   CBG: Recent Labs  Lab 03/21/20 1235 03/21/20 2335 03/22/20 0545 03/22/20 1137  GLUCAP 185* 159* 190* 182*   Lipid Profile: No results for input(s): CHOL, HDL, LDLCALC, TRIG, CHOLHDL, LDLDIRECT in the last 72 hours. Thyroid Function Tests: No results for input(s): TSH, T4TOTAL, FREET4, T3FREE, THYROIDAB in the last 72 hours. Anemia Panel: No results for input(s): VITAMINB12, FOLATE, FERRITIN, TIBC, IRON, RETICCTPCT in the last 72 hours. Sepsis Labs: Recent Labs  Lab 03/20/20 1042 03/22/20 0513  PROCALCITON 0.14 0.16  LATICACIDVEN 0.7  --     Recent Results (from the past 240 hour(s))  Blood Culture (routine x 2)     Status: None (Preliminary result)   Collection Time: 03/20/20 10:42 AM   Specimen: BLOOD  Result Value Ref Range Status   Specimen Description BLOOD RIGHT HAND  Final   Special Requests   Final    BOTTLES DRAWN AEROBIC AND ANAEROBIC Blood Culture adequate volume   Culture   Final    NO GROWTH 2 DAYS Performed at  [facility], [address], [place], [place] [address]    Report Status PENDING  Incomplete  Blood Culture (routine x 2)     Status: Abnormal   Collection Time: 03/20/20 10:42 AM   Specimen: BLOOD  Result Value Ref Range Status   Specimen Description   Final    BLOOD RIGHT FAS Performed at [facility], [address] [place], [place] [address]    Special Requests   Final    BOTTLES DRAWN AEROBIC AND ANAEROBIC Blood Culture adequate volume Performed at [facility], [address], [place], [place] [address]    Culture  Setup Time   Final    GRAM POSITIVE COCCI Organism ID to follow AEROBIC BOTTLE ONLY CRITICAL RESULT CALLED TO, READ BACK BY AND VERIFIED WITH: [place]  03/21/20 SDR Performed at [facility], [address] [place], [place] [address]    Culture (A)  Final    STAPHYLOCOCCUS SPECIES (COAGULASE NEGATIVE) THE SIGNIFICANCE OF ISOLATING THIS ORGANISM FROM A SINGLE SET OF BLOOD CULTURES WHEN MULTIPLE SETS ARE DRAWN IS UNCERTAIN. PLEASE NOTIFY THE MICROBIOLOGY DEPARTMENT WITHIN ONE WEEK IF SPECIATION AND SENSITIVITIES ARE REQUIRED. Performed at [facility], [place] [address] [place], [place] [address]    Report Status 03/22/2020 FINAL  Final  Urine culture     Status: None   Collection Time: 03/20/20 10:42 AM   Specimen: In/Out Cath Urine  Result Value Ref Range Status   Specimen Description   Final    IN/OUT CATH URINE Performed at [facility], [address], [place], [place] [address]    Special Requests   Final    NONE Performed at [facility], [address], [place], [place] [address]    Culture   Final    NO GROWTH Performed at [facility], [place] [address], [place], [place] [address]    Report Status 03/21/2020 FINAL  Final  Blood Culture ID Panel (Reflexed)     Status: Abnormal   Collection Time: 03/20/20 10:42 AM  Result Value Ref Range Status   Enterococcus species  NOT DETECTED NOT DETECTED Final   Listeria monocytogenes NOT DETECTED NOT DETECTED Final   Staphylococcus species DETECTED (A) NOT DETECTED Final    Comment: Methicillin (oxacillin) susceptible coagulase negative staphylococcus. Possible blood culture contaminant (unless isolated from more than one blood culture draw or clinical case suggests pathogenicity). No antibiotic treatment is indicated for blood  culture contaminants. CRITICAL RESULT CALLED TO, READ BACK BY AND VERIFIED WITH: SCOTT HALL AT 0505 03/21/20 SDR    Staphylococcus aureus (BCID) NOT DETECTED NOT DETECTED Final   Methicillin resistance NOT DETECTED NOT DETECTED Final   Streptococcus species NOT DETECTED NOT DETECTED Final   Streptococcus agalactiae NOT DETECTED NOT DETECTED Final   Streptococcus pneumoniae NOT DETECTED NOT DETECTED Final   Streptococcus pyogenes NOT DETECTED NOT DETECTED Final   Acinetobacter baumannii NOT DETECTED NOT DETECTED Final   Enterobacteriaceae species NOT DETECTED NOT DETECTED Final   Enterobacter cloacae complex NOT DETECTED NOT DETECTED Final   Escherichia coli NOT DETECTED NOT DETECTED Final   Klebsiella oxytoca NOT DETECTED NOT DETECTED Final   Klebsiella pneumoniae NOT DETECTED NOT DETECTED Final   Proteus species NOT DETECTED NOT DETECTED Final   Serratia marcescens NOT DETECTED NOT DETECTED Final   Haemophilus influenzae NOT DETECTED NOT DETECTED Final   Neisseria meningitidis NOT DETECTED NOT DETECTED Final   Pseudomonas aeruginosa NOT DETECTED NOT DETECTED Final   Candida albicans NOT DETECTED NOT DETECTED Final   Candida glabrata NOT DETECTED NOT DETECTED Final   Candida krusei NOT DETECTED NOT DETECTED Final   Candida parapsilosis NOT DETECTED NOT DETECTED Final   Candida tropicalis NOT DETECTED NOT DETECTED Final    Comment: Performed at [facility], [address] [place], [place] [address]  SARS Coronavirus 2 by RT PCR (hospital order, performed in [place]  hospital lab) Nasopharyngeal Nasopharyngeal Swab     Status: None   Collection Time: 03/20/20 11:29 AM   Specimen: Nasopharyngeal Swab  Result Value Ref Range Status   SARS Coronavirus 2 NEGATIVE NEGATIVE Final    Comment: (NOTE) SARS-CoV-2 target nucleic acids are NOT DETECTED. The SARS-CoV-2 RNA is generally detectable in upper and lower respiratory specimens during the acute phase of infection.  The lowest concentration of SARS-CoV-2 viral copies this assay can detect is 250 copies / mL. A negative result does not preclude SARS-CoV-2 infection and should not be used as the sole basis for treatment or other patient management decisions.  A negative result may occur with improper specimen collection / handling, submission of specimen other than nasopharyngeal swab, presence of viral mutation(s) within the areas targeted by this assay, and inadequate number of viral copies (<250 copies / mL). A negative result must be combined with clinical observations, patient history, and epidemiological information. Fact Sheet for Patients:   StrictlyIdeas.no Fact Sheet for Healthcare Providers: BankingDealers.co.za This test is not yet approved or cleared  by the [place] FDA and has been authorized for detection and/or diagnosis of SARS-CoV-2 by FDA under an Emergency Use Authorization (EUA).  This EUA will remain in effect (meaning this test can be used) for the duration of the COVID-19 declaration under Section 564(b)(1) of the Act, 21 U.S.C. section 360bbb-3(b)(1), unless the authorization is terminated or revoked sooner. Performed at [facility], [address] [place], [place] [address]       Radiology Studies: No results found.   Scheduled Meds: . carbidopa-levodopa  1 tablet Oral TID  . Carbidopa-Levodopa ER  1 tablet Oral BID  . gabapentin  100 mg Oral TID  . QUEtiapine  25 mg Oral QHS  . rOPINIRole  1 mg Oral TID   . triamcinolone cream  1 application Topical BID   Continuous Infusions:    LOS: 2 days     Enzo Bi, MD [facility] If 7PM-7AM, please contact night-coverage 03/22/2020, 4:53 PM

## 2020-03-22 NOTE — Progress Notes (Signed)
Pt had two large episodes of thick, brown sputum/vomiting occur. Notified Rufina Falco, NP.

## 2020-03-22 NOTE — Care Management Important Message (Signed)
Important Message  Patient Details  Name: [name] [name] MRN: [id number] Date of Birth: 1929-01-17   Medicare Important Message Given:  Yes  Initial Medicare IM given by Patient Access Associate 03/22/2020 at 11:38am.   [name] 03/22/2020, 12:23 PM

## 2020-03-23 ENCOUNTER — Ambulatory Visit: Payer: Medicare HMO

## 2020-03-23 NOTE — TOC Progression Note (Signed)
Transition of Care [facility] - Progression Note    Patient Details  Name: Tayra Dawe MRN: [id number] Date of Birth: [birth year]  [facility] CM/SW Contact  Meriel Flavors, LCSW Phone Number: 03/23/2020, 4:23 PM  Clinical Narrative:    5/29: Doctor requested update on hospice bed. I assumed it must be [facility]? Called them, they did not have a referral for this patient and she was not on their bed waiting list. CSW faxed all referral docs and request to Jackelyn Poling, states it will more than likely be Monday before they have a bed, but Pt is on the list. If the hospice provider was someone other than [facility], please let Debbie know.          Expected Discharge Plan and Services                                                 Social Determinants of Health (SDOH) Interventions    Readmission Risk Interventions No flowsheet data found.

## 2020-03-23 NOTE — Progress Notes (Signed)
PROGRESS NOTE    [name] [name]  OQH:[id number] DOB: 1928/11/10 DOA: 03/20/2020 PCP: Gayland Curry, MD    Assessment & Plan:   Principal Problem:   CAP (community acquired pneumonia) Active Problems:   Diabetes ([place])   Essential hypertension   INTERSTITIAL LUNG DISEASE   CKD (chronic kidney disease), stage IV ([place])   Terminal care   Chronic respiratory failure with hypoxia ([place])   Advanced dementia ([place])   Parkinson's disease ([place])   Palliative care by specialist   DNR (do not resuscitate)    [name] [name] is a 86 y.o. female with medical history significant for Lewy body dementia, interstitial lung disease with chronic respiratory failure on 2 L of oxygen and Parkinson's disease who was brought into the hospital by EMS for evaluation of mental status changes.   AMS  Lewy body dementia with Parkinson features End of life and comfort care --Pt has been having rapid decline, and stopped eating and drinking for several days prior to presentation --palliative consult 5/28, pt made comfort care PLAN: --Hold MIVF --d/c Sinemet  --d/c Seroquel  --IV morphine, IV ativan, and IV haldol PRN  Hypoglycemia, POA, resolved --BG 47 on presentation.  likely due to poor PO intake.  Hypothermia, POA, resolved --likely due to hypoglycemia  Community-acquired pneumonia, ruled out Patient was hypothermic when she arrived, and was placed empirically on Rocephin and Zithromax, however, CT chest showed no consolidation or signs of infection.  Procal 0.14.  Cough with brown sputum production is baseline, per son.  --abx d/c'ed  History of interstitial lung disease with chronic respiratory failure --O2 requirement at baseline. --Continue oxygen supplementation at 2 L for comfort  Diabetes mellitus with complications of stage IV chronic kidney disease  Hypertension D/c BP meds now that pt is comfort care  Hypothyroidism D/c Synthroid now that pt is comfrot care.    DVT prophylaxis: None:Comfort Care Code Status: DNR comfort care Family Communication: sons and sister updated at bedside today Status is: inpatient Dispo:   The patient is from: home Anticipated d/c is to: hospice facility Anticipated d/c date is: when bed available Patient currently is not medically stable to d/c due to: approaching end of life, need to be discharged to hospice facility (family not able to provide care at home).   Subjective and Interval History:  Pt confirmed she was comfortable.  Coughing/spitting some sputum about daily.  Still declined to eat or drink anything, and declined to take any oral meds.  No fever, N/V/D.    Family at bedside.   Objective: Vitals:   03/22/20 0802 03/22/20 0827 03/22/20 1140 03/22/20 2004  BP: (!) 164/75 (!) 158/74 (!) 161/80 (!) 179/83  Pulse: (!) 102 (!) 102 100 (!) 102  Resp: (!) 26  (!) 24   Temp: 98.1 F (36.7 C)  98.3 F (36.8 C)   TempSrc: Oral  Oral   SpO2: 91% 94% 91% (!) 89%  Weight:      Height:        Intake/Output Summary (Last 24 hours) at 03/23/2020 1448 Last data filed at 03/23/2020 0539 Gross per 24 hour  Intake 0 ml  Output 550 ml  Net -550 ml   Filed Weights   03/20/20 1051  Weight: 63.5 kg    Examination:   Constitutional: NAD, alert, oriented to self, responsive to simple questions, sitting up in bed, appeared comfortable HEENT: conjunctivae and lids normal, EOMI CV: RRR 3+ systolic murmur at upper sternal boarder. Distal pulses +2.  No cyanosis.   RESP: loud Velcro-like crackles, normal respiratory effort, on 2L GI: +BS, NTND Extremities: No effusions, edema, or tenderness in BLE SKIN: warm.  Erythema under the groin skin fold.  Extensive bruising, thin skin.   Data Reviewed: I have personally reviewed following labs and imaging studies  CBC: Recent Labs  Lab 03/20/20 1042 03/21/20 1142 03/22/20 0513  WBC 9.0 9.2 8.5  NEUTROABS 4.6  --   --   HGB 11.1* 10.8* 10.9*  HCT 35.6* 34.1*  33.4*  MCV 97.3 96.9 93.6  PLT 147* 170 280*   Basic Metabolic Panel: Recent Labs  Lab 03/20/20 1042 03/21/20 0338 03/22/20 0513  NA 133* 134* 136  K 4.2 5.1 4.5  CL 97* 100 99  CO2 26 22 27   GLUCOSE 47* 111* 195*  BUN 42* 28* 28*  CREATININE 1.76* 1.27* 1.14*  CALCIUM 9.9 9.2 9.4  MG  --   --  1.7   GFR: Estimated Creatinine Clearance: 29.9 mL/min (A) (by C-G formula based on SCr of 1.14 mg/dL (H)). Liver Function Tests: Recent Labs  Lab 03/20/20 1042  AST 28  ALT <5  ALKPHOS 52  BILITOT 0.7  PROT 8.4*  ALBUMIN 2.9*   Recent Labs  Lab 03/20/20 1042  LIPASE 25   No results for input(s): AMMONIA in the last 168 hours. Coagulation Profile: Recent Labs  Lab 03/20/20 1042  INR 1.1   Cardiac Enzymes: No results for input(s): CKTOTAL, CKMB, CKMBINDEX, TROPONINI in the last 168 hours. BNP (last 3 results) No results for input(s): PROBNP in the last 8760 hours. HbA1C: No results for input(s): HGBA1C in the last 72 hours. CBG: Recent Labs  Lab 03/21/20 1235 03/21/20 2335 03/22/20 0545 03/22/20 1137  GLUCAP 185* 159* 190* 182*   Lipid Profile: No results for input(s): CHOL, HDL, LDLCALC, TRIG, CHOLHDL, LDLDIRECT in the last 72 hours. Thyroid Function Tests: No results for input(s): TSH, T4TOTAL, FREET4, T3FREE, THYROIDAB in the last 72 hours. Anemia Panel: No results for input(s): VITAMINB12, FOLATE, FERRITIN, TIBC, IRON, RETICCTPCT in the last 72 hours. Sepsis Labs: Recent Labs  Lab 03/20/20 1042 03/22/20 0513  PROCALCITON 0.14 0.16  LATICACIDVEN 0.7  --     Recent Results (from the past 240 hour(s))  Blood Culture (routine x 2)     Status: None (Preliminary result)   Collection Time: 03/20/20 10:42 AM   Specimen: BLOOD  Result Value Ref Range Status   Specimen Description BLOOD RIGHT HAND  Final   Special Requests   Final    BOTTLES DRAWN AEROBIC AND ANAEROBIC Blood Culture adequate volume   Culture   Final    NO GROWTH 3 DAYS Performed at  [facility], [address] [place], [place] [address]    Report Status PENDING  Incomplete  Blood Culture (routine x 2)     Status: Abnormal   Collection Time: 03/20/20 10:42 AM   Specimen: BLOOD  Result Value Ref Range Status   Specimen Description   Final    BLOOD RIGHT FAS Performed at [facility], [address], [place], [place] [address]    Special Requests   Final    BOTTLES DRAWN AEROBIC AND ANAEROBIC Blood Culture adequate volume Performed at [facility], [address], [place], [place] [address]    Culture  Setup Time   Final    GRAM POSITIVE COCCI Organism ID to follow AEROBIC BOTTLE ONLY CRITICAL RESULT CALLED TO, READ BACK BY AND VERIFIED WITH: [place]  AT 0505 03/21/20 SDR Performed at [facility], [address] [place], [place] [address]    Culture (A)  Final    STAPHYLOCOCCUS SPECIES (COAGULASE NEGATIVE) THE SIGNIFICANCE OF ISOLATING THIS ORGANISM FROM A SINGLE SET OF BLOOD CULTURES WHEN MULTIPLE SETS ARE DRAWN IS UNCERTAIN. PLEASE NOTIFY THE MICROBIOLOGY DEPARTMENT WITHIN ONE WEEK IF SPECIATION AND SENSITIVITIES ARE REQUIRED. Performed at [facility], [place] [address] [place], [place] [address]    Report Status 03/22/2020 FINAL  Final  Urine culture     Status: None   Collection Time: 03/20/20 10:42 AM   Specimen: In/Out Cath Urine  Result Value Ref Range Status   Specimen Description   Final    IN/OUT CATH URINE Performed at [facility], [address], [place], [place] [address]    Special Requests   Final    NONE Performed at [facility], [address], [place], [place] [address]    Culture   Final    NO GROWTH Performed at [facility], [place] [address] [place], [place] [address]    Report Status 03/21/2020 FINAL  Final  Blood Culture ID Panel (Reflexed)     Status: Abnormal   Collection Time: 03/20/20 10:42 AM  Result Value Ref Range Status   Enterococcus species  NOT DETECTED NOT DETECTED Final   Listeria monocytogenes NOT DETECTED NOT DETECTED Final   Staphylococcus species DETECTED (A) NOT DETECTED Final    Comment: Methicillin (oxacillin) susceptible coagulase negative staphylococcus. Possible blood culture contaminant (unless isolated from more than one blood culture draw or clinical case suggests pathogenicity). No antibiotic treatment is indicated for blood  culture contaminants. CRITICAL RESULT CALLED TO, READ BACK BY AND VERIFIED WITH: SCOTT HALL AT 0505 03/21/20 SDR    Staphylococcus aureus (BCID) NOT DETECTED NOT DETECTED Final   Methicillin resistance NOT DETECTED NOT DETECTED Final   Streptococcus species NOT DETECTED NOT DETECTED Final   Streptococcus agalactiae NOT DETECTED NOT DETECTED Final   Streptococcus pneumoniae NOT DETECTED NOT DETECTED Final   Streptococcus pyogenes NOT DETECTED NOT DETECTED Final   Acinetobacter baumannii NOT DETECTED NOT DETECTED Final   Enterobacteriaceae species NOT DETECTED NOT DETECTED Final   Enterobacter cloacae complex NOT DETECTED NOT DETECTED Final   Escherichia coli NOT DETECTED NOT DETECTED Final   Klebsiella oxytoca NOT DETECTED NOT DETECTED Final   Klebsiella pneumoniae NOT DETECTED NOT DETECTED Final   Proteus species NOT DETECTED NOT DETECTED Final   Serratia marcescens NOT DETECTED NOT DETECTED Final   Haemophilus influenzae NOT DETECTED NOT DETECTED Final   Neisseria meningitidis NOT DETECTED NOT DETECTED Final   Pseudomonas aeruginosa NOT DETECTED NOT DETECTED Final   Candida albicans NOT DETECTED NOT DETECTED Final   Candida glabrata NOT DETECTED NOT DETECTED Final   Candida krusei NOT DETECTED NOT DETECTED Final   Candida parapsilosis NOT DETECTED NOT DETECTED Final   Candida tropicalis NOT DETECTED NOT DETECTED Final    Comment: Performed at [facility], [address] [place], [place] [address]  SARS Coronavirus 2 by RT PCR (hospital order, performed in [place]  hospital lab) Nasopharyngeal Nasopharyngeal Swab     Status: None   Collection Time: 03/20/20 11:29 AM   Specimen: Nasopharyngeal Swab  Result Value Ref Range Status   SARS Coronavirus 2 NEGATIVE NEGATIVE Final    Comment: (NOTE) SARS-CoV-2 target nucleic acids are NOT DETECTED. The SARS-CoV-2 RNA is generally detectable in upper and lower respiratory specimens during the acute phase  of infection. The lowest concentration of SARS-CoV-2 viral copies this assay can detect is 250 copies / mL. A negative result does not preclude SARS-CoV-2 infection and should not be used as the sole basis for treatment or other patient management decisions.  A negative result may occur with improper specimen collection / handling, submission of specimen other than nasopharyngeal swab, presence of viral mutation(s) within the areas targeted by this assay, and inadequate number of viral copies (<250 copies / mL). A negative result must be combined with clinical observations, patient history, and epidemiological information. Fact Sheet for Patients:   StrictlyIdeas.no Fact Sheet for Healthcare Providers: BankingDealers.co.za This test is not yet approved or cleared  by the [place] FDA and has been authorized for detection and/or diagnosis of SARS-CoV-2 by FDA under an Emergency Use Authorization (EUA).  This EUA will remain in effect (meaning this test can be used) for the duration of the COVID-19 declaration under Section 564(b)(1) of the Act, 21 U.S.C. section 360bbb-3(b)(1), unless the authorization is terminated or revoked sooner. Performed at [facility], [address] [place], [place] [address]       Radiology Studies: No results found.   Scheduled Meds: . carbidopa-levodopa  1 tablet Oral TID  . Carbidopa-Levodopa ER  1 tablet Oral BID  . gabapentin  100 mg Oral TID  . QUEtiapine  25 mg Oral QHS  . rOPINIRole  1 mg Oral TID   . triamcinolone cream  1 application Topical BID   Continuous Infusions:    LOS: 3 days     Enzo Bi, MD [facility] If 7PM-7AM, please contact night-coverage 03/23/2020, 2:48 PM

## 2020-03-24 NOTE — Progress Notes (Signed)
PROGRESS NOTE    [name] [name]  KGM:[id number] DOB: 1929-05-01 DOA: 03/20/2020 PCP: Gayland Curry, MD    Assessment & Plan:   Principal Problem:   CAP (community acquired pneumonia) Active Problems:   Diabetes ([place])   Essential hypertension   INTERSTITIAL LUNG DISEASE   CKD (chronic kidney disease), stage IV ([place])   Terminal care   Chronic respiratory failure with hypoxia ([place])   Advanced dementia ([place])   Parkinson's disease ([place])   Palliative care by specialist   DNR (do not resuscitate)    [name] [name] is a 86 y.o. Caucasian female with medical history significant for Lewy body dementia, interstitial lung disease with chronic respiratory failure on 2 L of oxygen and Parkinson's disease who was brought into the hospital by EMS for evaluation of mental status changes.   AMS  Lewy body dementia with Parkinson features End of life and comfort care --Pt has been having rapid decline, and stopped eating and drinking for several days prior to presentation --palliative consult 5/28, pt made comfort care --home Sinemet and Seroquel d/c'ed PLAN: --Hold MIVF --IV morphine, IV ativan, and IV haldol PRN  Hypoglycemia, POA, resolved --BG 47 on presentation.  likely due to poor PO intake.  Hypothermia, POA, resolved --likely due to hypoglycemia  Community-acquired pneumonia, ruled out Patient was hypothermic when she arrived, and was placed empirically on Rocephin and Zithromax, however, CT chest showed no consolidation or signs of infection.  Procal 0.14.  Cough with brown sputum production is baseline, per son.  --abx d/c'ed  History of interstitial lung disease with chronic respiratory failure --O2 requirement at baseline. --Continue oxygen supplementation at 2 L for comfort  Diabetes mellitus with complications of stage IV chronic kidney disease  Hypertension D/c BP meds now that pt is comfort care  Hypothyroidism D/c Synthroid now that pt is comfrot  care.   DVT prophylaxis: None:Comfort Care Code Status: DNR comfort care Family Communication: son updated at bedside today Status is: inpatient Dispo:   The patient is from: home Anticipated d/c is to: hospice facility Anticipated d/c date is: when bed available Patient currently is not medically stable to d/c due to: approaching end of life, need to be discharged to hospice facility (family not able to provide care at home).   Subjective and Interval History:  Pt had a black BM today.  Continued to refuse any food or drink, though still appeared comfortable.  No fever, N/V/D.  Family at bedside.   Objective: Vitals:   03/22/20 1140 03/22/20 2004 03/23/20 1856 03/24/20 1348  BP: (!) 161/80 (!) 179/83 (!) 166/83 130/81  Pulse: 100 (!) 102 (!) 112 (!) 129  Resp: (!) 24  20 20   Temp: 98.3 F (36.8 C)  98.6 F (37 C) 97.8 F (36.6 C)  TempSrc: Oral  Oral Axillary  SpO2: 91% (!) 89% 97% 100%  Weight:      Height:        Intake/Output Summary (Last 24 hours) at 03/24/2020 1526 Last data filed at 03/23/2020 1700 Gross per 24 hour  Intake --  Output 200 ml  Net -200 ml   Filed Weights   03/20/20 1051  Weight: 63.5 kg    Examination:   Constitutional: NAD, alert, oriented to self, responsive to simple questions, appeared comfortable HEENT: conjunctivae and lids normal, EOMI CV: RRR 3+ systolic murmur at upper sternal boarder. Distal pulses +2.  No cyanosis.   RESP: loud Velcro-like crackles, normal respiratory effort, on 2L GI: +BS, NTND  Extremities: No effusions, edema, or tenderness in BLE SKIN: warm.  Erythema under the groin skin fold.  Extensive bruising, thin skin.   Data Reviewed: I have personally reviewed following labs and imaging studies  CBC: Recent Labs  Lab 03/20/20 1042 03/21/20 1142 03/22/20 0513  WBC 9.0 9.2 8.5  NEUTROABS 4.6  --   --   HGB 11.1* 10.8* 10.9*  HCT 35.6* 34.1* 33.4*  MCV 97.3 96.9 93.6  PLT 147* 170 182*   Basic Metabolic  Panel: Recent Labs  Lab 03/20/20 1042 03/21/20 0338 03/22/20 0513  NA 133* 134* 136  K 4.2 5.1 4.5  CL 97* 100 99  CO2 26 22 27   GLUCOSE 47* 111* 195*  BUN 42* 28* 28*  CREATININE 1.76* 1.27* 1.14*  CALCIUM 9.9 9.2 9.4  MG  --   --  1.7   GFR: Estimated Creatinine Clearance: 29.9 mL/min (A) (by C-G formula based on SCr of 1.14 mg/dL (H)). Liver Function Tests: Recent Labs  Lab 03/20/20 1042  AST 28  ALT <5  ALKPHOS 52  BILITOT 0.7  PROT 8.4*  ALBUMIN 2.9*   Recent Labs  Lab 03/20/20 1042  LIPASE 25   No results for input(s): AMMONIA in the last 168 hours. Coagulation Profile: Recent Labs  Lab 03/20/20 1042  INR 1.1   Cardiac Enzymes: No results for input(s): CKTOTAL, CKMB, CKMBINDEX, TROPONINI in the last 168 hours. BNP (last 3 results) No results for input(s): PROBNP in the last 8760 hours. HbA1C: No results for input(s): HGBA1C in the last 72 hours. CBG: Recent Labs  Lab 03/21/20 1235 03/21/20 2335 03/22/20 0545 03/22/20 1137  GLUCAP 185* 159* 190* 182*   Lipid Profile: No results for input(s): CHOL, HDL, LDLCALC, TRIG, CHOLHDL, LDLDIRECT in the last 72 hours. Thyroid Function Tests: No results for input(s): TSH, T4TOTAL, FREET4, T3FREE, THYROIDAB in the last 72 hours. Anemia Panel: No results for input(s): VITAMINB12, FOLATE, FERRITIN, TIBC, IRON, RETICCTPCT in the last 72 hours. Sepsis Labs: Recent Labs  Lab 03/20/20 1042 03/22/20 0513  PROCALCITON 0.14 0.16  LATICACIDVEN 0.7  --     Recent Results (from the past 240 hour(s))  Blood Culture (routine x 2)     Status: None (Preliminary result)   Collection Time: 03/20/20 10:42 AM   Specimen: BLOOD  Result Value Ref Range Status   Specimen Description BLOOD RIGHT HAND  Final   Special Requests   Final    BOTTLES DRAWN AEROBIC AND ANAEROBIC Blood Culture adequate volume   Culture   Final    NO GROWTH 4 DAYS Performed at [facility], [address], [place], [place]  [address]    Report Status PENDING  Incomplete  Blood Culture (routine x 2)     Status: Abnormal   Collection Time: 03/20/20 10:42 AM   Specimen: BLOOD  Result Value Ref Range Status   Specimen Description   Final    BLOOD RIGHT FAS Performed at [facility], [address] [place], [place] [address]    Special Requests   Final    BOTTLES DRAWN AEROBIC AND ANAEROBIC Blood Culture adequate volume Performed at [facility], [address], [place], [place] [address]    Culture  Setup Time   Final    GRAM POSITIVE COCCI Organism ID to follow AEROBIC BOTTLE ONLY CRITICAL RESULT CALLED TO, READ BACK BY AND VERIFIED WITH: SCOTT HALL AT [place] 03/21/20 [place] Performed at [facility], [address] [place], [place] [address]  Culture (A)  Final    STAPHYLOCOCCUS SPECIES (COAGULASE NEGATIVE) THE SIGNIFICANCE OF ISOLATING THIS ORGANISM FROM A SINGLE SET OF BLOOD CULTURES WHEN MULTIPLE SETS ARE DRAWN IS UNCERTAIN. PLEASE NOTIFY THE MICROBIOLOGY DEPARTMENT WITHIN ONE WEEK IF SPECIATION AND SENSITIVITIES ARE REQUIRED. Performed at [facility], [place] [address] [place], [place] [address]    Report Status 03/22/2020 FINAL  Final  Urine culture     Status: None   Collection Time: 03/20/20 10:42 AM   Specimen: In/Out Cath Urine  Result Value Ref Range Status   Specimen Description   Final    IN/OUT CATH URINE Performed at [facility], [address] [place], [place] [address]    Special Requests   Final    NONE Performed at [facility], [address], [place], [place] [address]    Culture   Final    NO GROWTH Performed at [facility], [place] [address] [place], [place] [address]    Report Status 03/21/2020 FINAL  Final  Blood Culture ID Panel (Reflexed)     Status: Abnormal   Collection Time: 03/20/20 10:42 AM  Result Value Ref Range Status   Enterococcus species NOT DETECTED NOT DETECTED Final   Listeria monocytogenes NOT  DETECTED NOT DETECTED Final   Staphylococcus species DETECTED (A) NOT DETECTED Final    Comment: Methicillin (oxacillin) susceptible coagulase negative staphylococcus. Possible blood culture contaminant (unless isolated from more than one blood culture draw or clinical case suggests pathogenicity). No antibiotic treatment is indicated for blood  culture contaminants. CRITICAL RESULT CALLED TO, READ BACK BY AND VERIFIED WITH: SCOTT HALL AT 0505 03/21/20 SDR    Staphylococcus aureus (BCID) NOT DETECTED NOT DETECTED Final   Methicillin resistance NOT DETECTED NOT DETECTED Final   Streptococcus species NOT DETECTED NOT DETECTED Final   Streptococcus agalactiae NOT DETECTED NOT DETECTED Final   Streptococcus pneumoniae NOT DETECTED NOT DETECTED Final   Streptococcus pyogenes NOT DETECTED NOT DETECTED Final   Acinetobacter baumannii NOT DETECTED NOT DETECTED Final   Enterobacteriaceae species NOT DETECTED NOT DETECTED Final   Enterobacter cloacae complex NOT DETECTED NOT DETECTED Final   Escherichia coli NOT DETECTED NOT DETECTED Final   Klebsiella oxytoca NOT DETECTED NOT DETECTED Final   Klebsiella pneumoniae NOT DETECTED NOT DETECTED Final   Proteus species NOT DETECTED NOT DETECTED Final   Serratia marcescens NOT DETECTED NOT DETECTED Final   Haemophilus influenzae NOT DETECTED NOT DETECTED Final   Neisseria meningitidis NOT DETECTED NOT DETECTED Final   Pseudomonas aeruginosa NOT DETECTED NOT DETECTED Final   Candida albicans NOT DETECTED NOT DETECTED Final   Candida glabrata NOT DETECTED NOT DETECTED Final   Candida krusei NOT DETECTED NOT DETECTED Final   Candida parapsilosis NOT DETECTED NOT DETECTED Final   Candida tropicalis NOT DETECTED NOT DETECTED Final    Comment: Performed at [facility], [address] [place], [place] [address]  SARS Coronavirus 2 by RT PCR (hospital order, performed in [place] hospital lab) Nasopharyngeal Nasopharyngeal Swab     Status: None    Collection Time: 03/20/20 11:29 AM   Specimen: Nasopharyngeal Swab  Result Value Ref Range Status   SARS Coronavirus 2 NEGATIVE NEGATIVE Final    Comment: (NOTE) SARS-CoV-2 target nucleic acids are NOT DETECTED. The SARS-CoV-2 RNA is generally detectable in upper and lower respiratory specimens during the acute phase of infection. The lowest concentration of SARS-CoV-2 viral copies this assay can detect is 250 copies / mL. A  negative result does not preclude SARS-CoV-2 infection and should not be used as the sole basis for treatment or other patient management decisions.  A negative result may occur with improper specimen collection / handling, submission of specimen other than nasopharyngeal swab, presence of viral mutation(s) within the areas targeted by this assay, and inadequate number of viral copies (<250 copies / mL). A negative result must be combined with clinical observations, patient history, and epidemiological information. Fact Sheet for Patients:   StrictlyIdeas.no Fact Sheet for Healthcare Providers: BankingDealers.co.za This test is not yet approved or cleared  by the [place] FDA and has been authorized for detection and/or diagnosis of SARS-CoV-2 by FDA under an Emergency Use Authorization (EUA).  This EUA will remain in effect (meaning this test can be used) for the duration of the COVID-19 declaration under Section 564(b)(1) of the Act, 21 U.S.C. section 360bbb-3(b)(1), unless the authorization is terminated or revoked sooner. Performed at [facility], [address], [place], [place] [address]       Radiology Studies: No results found.   Scheduled Meds: . triamcinolone cream  1 application Topical BID   Continuous Infusions:    LOS: 4 days     Enzo Bi, MD [facility] If 7PM-7AM, please contact night-coverage 03/24/2020, 3:26 PM

## 2020-03-25 LAB — CULTURE, BLOOD (ROUTINE X 2)
Culture: NO GROWTH
Special Requests: ADEQUATE

## 2020-03-25 NOTE — Care Management Important Message (Signed)
Important Message  Patient Details  Name: [name] [name] MRN: [id number] Date of Birth: 12/25/1928   Medicare Important Message Given:  Yes     [name] 03/25/2020, 10:43 AM

## 2020-03-25 NOTE — TOC Progression Note (Signed)
Transition of Care [facility]) - Progression Note    Patient Details  Name: [name] [name] MRN: [id number] Date of Birth: September 17, 1929  Transition of Care [facility]) CM/SW [place], LCSW Phone Number: 03/25/2020, 9:46 AM  Clinical Narrative:   CSW called [facility] to follow up on bed availability. Spoke with [employer] who reported they do not have any beds and will not have any available today. CSW will continue to follow.          Expected Discharge Plan and Services                                                 Social Determinants of Health (SDOH) Interventions    Readmission Risk Interventions No flowsheet data found.

## 2020-03-25 NOTE — Progress Notes (Signed)
PROGRESS NOTE    [name] [name]  IHK:[id number] DOB: 1929-06-24 DOA: 03/20/2020 PCP: Gayland Curry, MD    Assessment & Plan:   Principal Problem:   CAP (community acquired pneumonia) Active Problems:   Diabetes ([place])   Essential hypertension   INTERSTITIAL LUNG DISEASE   CKD (chronic kidney disease), stage IV ([place])   Terminal care   Chronic respiratory failure with hypoxia ([place])   Advanced dementia ([place])   Parkinson's disease ([place])   Palliative care by specialist   DNR (do not resuscitate)    [name] [name] is a 86 y.o. Caucasian female with medical history significant for Lewy body dementia, interstitial lung disease with chronic respiratory failure on 2 L of oxygen and Parkinson's disease who was brought into the hospital by EMS for evaluation of mental status changes.   AMS  Lewy body dementia with Parkinson features End of life and comfort care --Pt has been having rapid decline, and stopped eating and drinking for several days prior to presentation --palliative consult 5/28, pt made comfort care --home Sinemet and Seroquel d/c'ed PLAN: --Hold MIVF --IV morphine, IV ativan, and IV haldol PRN  Hypoglycemia, POA, resolved --BG 47 on presentation.  likely due to poor PO intake.  Hypothermia, POA, resolved --likely due to hypoglycemia  Community-acquired pneumonia, ruled out Patient was hypothermic when she arrived, and was placed empirically on Rocephin and Zithromax, however, CT chest showed no consolidation or signs of infection.  Procal 0.14.  Cough with brown sputum production is baseline, per son.  --abx d/c'ed  History of interstitial lung disease with chronic respiratory failure --O2 requirement at baseline. --Continue oxygen supplementation at 2 L for comfort  Diabetes mellitus with complications of stage IV chronic kidney disease  Hypertension D/c BP meds now that pt is comfort care  Hypothyroidism D/c Synthroid now that pt is comfrot  care.   DVT prophylaxis: None:Comfort Care Code Status: DNR comfort care Family Communication: son updated at bedside today Status is: inpatient Dispo:   The patient is from: home Anticipated d/c is to: hospice facility Anticipated d/c date is: when bed available Patient currently is not medically stable to d/c due to: approaching end of life, need to be discharged to hospice facility (family not able to provide care at home).   Subjective and Interval History:  Pt noted by son to be more restless.  Pulling off her [place] and gown.  Still having BM's but urine output tapering off.  Continued to refuse oral intake.  No fever, N/V/D.   Objective: Vitals:   03/23/20 1856 03/24/20 1348 03/24/20 2011 03/25/20 1012  BP: (!) 166/83 130/81 105/79   Pulse: (!) 112 (!) 129 (!) 132 (!) 107  Resp: 20 20 18 20   Temp: 98.6 F (37 C) 97.8 F (36.6 C) 97.7 F (36.5 C)   TempSrc: Oral Axillary Oral   SpO2: 97% 100% 97% 93%  Weight:      Height:        Intake/Output Summary (Last 24 hours) at 03/25/2020 1447 Last data filed at 03/24/2020 2059 Gross per 24 hour  Intake 0 ml  Output 0 ml  Net 0 ml   Filed Weights   03/20/20 1051  Weight: 63.5 kg    Examination:   Constitutional: NAD, somnolent today, not responsive today HEENT: conjunctivae and lids normal, EOMI CV: RRR 3+ systolic murmur at upper sternal boarder. Distal pulses +2.  No cyanosis.   RESP: loud Velcro-like crackles, normal respiratory effort, on RA GI: +BS,  NTND Extremities: No effusions, edema, or tenderness in BLE SKIN: warm.  Erythema under the groin skin fold.  Extensive bruising, thin skin.   Data Reviewed: I have personally reviewed following labs and imaging studies  CBC: Recent Labs  Lab 03/20/20 1042 03/21/20 1142 03/22/20 0513  WBC 9.0 9.2 8.5  NEUTROABS 4.6  --   --   HGB 11.1* 10.8* 10.9*  HCT 35.6* 34.1* 33.4*  MCV 97.3 96.9 93.6  PLT 147* 170 811*   Basic Metabolic Panel: Recent Labs  Lab  03/20/20 1042 03/21/20 0338 03/22/20 0513  NA 133* 134* 136  K 4.2 5.1 4.5  CL 97* 100 99  CO2 26 22 27   GLUCOSE 47* 111* 195*  BUN 42* 28* 28*  CREATININE 1.76* 1.27* 1.14*  CALCIUM 9.9 9.2 9.4  MG  --   --  1.7   GFR: Estimated Creatinine Clearance: 29.9 mL/min (A) (by C-G formula based on SCr of 1.14 mg/dL (H)). Liver Function Tests: Recent Labs  Lab 03/20/20 1042  AST 28  ALT <5  ALKPHOS 52  BILITOT 0.7  PROT 8.4*  ALBUMIN 2.9*   Recent Labs  Lab 03/20/20 1042  LIPASE 25   No results for input(s): AMMONIA in the last 168 hours. Coagulation Profile: Recent Labs  Lab 03/20/20 1042  INR 1.1   Cardiac Enzymes: No results for input(s): CKTOTAL, CKMB, CKMBINDEX, TROPONINI in the last 168 hours. BNP (last 3 results) No results for input(s): PROBNP in the last 8760 hours. HbA1C: No results for input(s): HGBA1C in the last 72 hours. CBG: Recent Labs  Lab 03/21/20 1235 03/21/20 2335 03/22/20 0545 03/22/20 1137  GLUCAP 185* 159* 190* 182*   Lipid Profile: No results for input(s): CHOL, HDL, LDLCALC, TRIG, CHOLHDL, LDLDIRECT in the last 72 hours. Thyroid Function Tests: No results for input(s): TSH, T4TOTAL, FREET4, T3FREE, THYROIDAB in the last 72 hours. Anemia Panel: No results for input(s): VITAMINB12, FOLATE, FERRITIN, TIBC, IRON, RETICCTPCT in the last 72 hours. Sepsis Labs: Recent Labs  Lab 03/20/20 1042 03/22/20 0513  PROCALCITON 0.14 0.16  LATICACIDVEN 0.7  --     Recent Results (from the past 240 hour(s))  Blood Culture (routine x 2)     Status: None   Collection Time: 03/20/20 10:42 AM   Specimen: BLOOD  Result Value Ref Range Status   Specimen Description BLOOD RIGHT HAND  Final   Special Requests   Final    BOTTLES DRAWN AEROBIC AND ANAEROBIC Blood Culture adequate volume   Culture   Final    NO GROWTH 5 DAYS Performed at [facility], [address], [place], [place] [address]    Report Status 03/25/2020 FINAL  Final   Blood Culture (routine x 2)     Status: Abnormal   Collection Time: 03/20/20 10:42 AM   Specimen: BLOOD  Result Value Ref Range Status   Specimen Description   Final    BLOOD RIGHT FAS Performed at [facility], [address] [place], [place] [address]    Special Requests   Final    BOTTLES DRAWN AEROBIC AND ANAEROBIC Blood Culture adequate volume Performed at [facility], [address] [place], [place] [address]    Culture  Setup Time   Final    GRAM POSITIVE COCCI Organism ID to follow AEROBIC BOTTLE ONLY CRITICAL RESULT CALLED TO, READ BACK BY AND VERIFIED WITH: SCOTT HALL AT [place] 03/21/20 [place] Performed at [facility], [address], [place], [place] [address]  Culture (A)  Final    STAPHYLOCOCCUS SPECIES (COAGULASE NEGATIVE) THE SIGNIFICANCE OF ISOLATING THIS ORGANISM FROM A SINGLE SET OF BLOOD CULTURES WHEN MULTIPLE SETS ARE DRAWN IS UNCERTAIN. PLEASE NOTIFY THE MICROBIOLOGY DEPARTMENT WITHIN ONE WEEK IF SPECIATION AND SENSITIVITIES ARE REQUIRED. Performed at [facility], [place] [address] [place], [place] [address]    Report Status 03/22/2020 FINAL  Final  Urine culture     Status: None   Collection Time: 03/20/20 10:42 AM   Specimen: In/Out Cath Urine  Result Value Ref Range Status   Specimen Description   Final    IN/OUT CATH URINE Performed at [facility], [address], [place], [place] [address]    Special Requests   Final    NONE Performed at [facility], [address], [place], [place] [address]    Culture   Final    NO GROWTH Performed at [facility], [place] [address], [place], [place] [address]    Report Status 03/21/2020 FINAL  Final  Blood Culture ID Panel (Reflexed)     Status: Abnormal   Collection Time: 03/20/20 10:42 AM  Result Value Ref Range Status   Enterococcus species NOT DETECTED NOT DETECTED Final   Listeria monocytogenes NOT DETECTED NOT DETECTED Final    Staphylococcus species DETECTED (A) NOT DETECTED Final    Comment: Methicillin (oxacillin) susceptible coagulase negative staphylococcus. Possible blood culture contaminant (unless isolated from more than one blood culture draw or clinical case suggests pathogenicity). No antibiotic treatment is indicated for blood  culture contaminants. CRITICAL RESULT CALLED TO, READ BACK BY AND VERIFIED WITH: SCOTT HALL AT 0505 03/21/20 [place]    Staphylococcus aureus (BCID) NOT DETECTED NOT DETECTED Final   Methicillin resistance NOT DETECTED NOT DETECTED Final   Streptococcus species NOT DETECTED NOT DETECTED Final   Streptococcus agalactiae NOT DETECTED NOT DETECTED Final   Streptococcus pneumoniae NOT DETECTED NOT DETECTED Final   Streptococcus pyogenes NOT DETECTED NOT DETECTED Final   Acinetobacter baumannii NOT DETECTED NOT DETECTED Final   Enterobacteriaceae species NOT DETECTED NOT DETECTED Final   Enterobacter cloacae complex NOT DETECTED NOT DETECTED Final   Escherichia coli NOT DETECTED NOT DETECTED Final   Klebsiella oxytoca NOT DETECTED NOT DETECTED Final   Klebsiella pneumoniae NOT DETECTED NOT DETECTED Final   Proteus species NOT DETECTED NOT DETECTED Final   Serratia marcescens NOT DETECTED NOT DETECTED Final   Haemophilus influenzae NOT DETECTED NOT DETECTED Final   Neisseria meningitidis NOT DETECTED NOT DETECTED Final   Pseudomonas aeruginosa NOT DETECTED NOT DETECTED Final   Candida albicans NOT DETECTED NOT DETECTED Final   Candida glabrata NOT DETECTED NOT DETECTED Final   Candida krusei NOT DETECTED NOT DETECTED Final   Candida parapsilosis NOT DETECTED NOT DETECTED Final   Candida tropicalis NOT DETECTED NOT DETECTED Final    Comment: Performed at [facility], [address] [place], [place] [address]  SARS Coronavirus 2 by RT PCR (hospital order, performed in [facility] hospital lab) Nasopharyngeal Nasopharyngeal Swab     Status: None   Collection Time: 03/20/20  11:29 AM   Specimen: Nasopharyngeal Swab  Result Value Ref Range Status   SARS Coronavirus 2 NEGATIVE NEGATIVE Final    Comment: (NOTE) SARS-CoV-2 target nucleic acids are NOT DETECTED. The SARS-CoV-2 RNA is generally detectable in upper and lower respiratory specimens during the acute phase of infection. The lowest concentration of SARS-CoV-2 viral copies this assay can detect is 250 copies / mL. A  negative result does not preclude SARS-CoV-2 infection and should not be used as the sole basis for treatment or other patient management decisions.  A negative result may occur with improper specimen collection / handling, submission of specimen other than nasopharyngeal swab, presence of viral mutation(s) within the areas targeted by this assay, and inadequate number of viral copies (<250 copies / mL). A negative result must be combined with clinical observations, patient history, and epidemiological information. Fact Sheet for Patients:   StrictlyIdeas.no Fact Sheet for Healthcare Providers: BankingDealers.co.za This test is not yet approved or cleared  by the [place] FDA and has been authorized for detection and/or diagnosis of SARS-CoV-2 by FDA under an Emergency Use Authorization (EUA).  This EUA will remain in effect (meaning this test can be used) for the duration of the COVID-19 declaration under Section 564(b)(1) of the Act, 21 U.S.C. section 360bbb-3(b)(1), unless the authorization is terminated or revoked sooner. Performed at [facility], [address], [place], [place] [address]       Radiology Studies: No results found.   Scheduled Meds: . triamcinolone cream  1 application Topical BID   Continuous Infusions:    LOS: 5 days     Enzo Bi, MD [facility] If 7PM-7AM, please contact night-coverage 03/25/2020, 2:47 PM

## 2020-03-25 NOTE — Progress Notes (Signed)
Pt resting comfortably. No signs of distress. Patient denies pain. Sons at bedside.

## 2020-03-26 DIAGNOSIS — R451 Restlessness and agitation: Secondary | ICD-10-CM

## 2020-03-26 DIAGNOSIS — Z515 Encounter for palliative care: Secondary | ICD-10-CM

## 2020-03-26 DIAGNOSIS — E039 Hypothyroidism, unspecified: Secondary | ICD-10-CM | POA: Diagnosis present

## 2020-03-26 DIAGNOSIS — F0280 Dementia in other diseases classified elsewhere without behavioral disturbance: Secondary | ICD-10-CM | POA: Diagnosis present

## 2020-03-26 DIAGNOSIS — E162 Hypoglycemia, unspecified: Secondary | ICD-10-CM | POA: Diagnosis present

## 2020-03-26 DIAGNOSIS — R52 Pain, unspecified: Secondary | ICD-10-CM | POA: Diagnosis present

## 2020-03-26 DIAGNOSIS — T68XXXA Hypothermia, initial encounter: Secondary | ICD-10-CM | POA: Diagnosis present

## 2020-03-26 MED ORDER — MORPHINE SULFATE (PF) 2 MG/ML IV SOLN
2.0000 mg | INTRAVENOUS | Status: DC | PRN
  Administered 2020-03-26: 2 mg via INTRAVENOUS
  Filled 2020-03-26: qty 1

## 2020-03-26 NOTE — Progress Notes (Signed)
Daily Progress Note   Patient Name: [name] [name]       Date: 03/26/2020 DOB: [birth year]  Age: 86 y.o. MRN#: [id number] Attending Physician: Enzo Bi, MD Primary Care Physician: Gayland Curry, MD Admit Date: 03/20/2020  Reason for Consultation/Follow-up: Terminal Care  Subjective: Son at bedside, patient awake, confused, rolling from side to side in bed, states she hurts all over  Length of Stay: 6  Current Medications: Scheduled Meds:  . triamcinolone cream  1 application Topical BID    Continuous Infusions:   PRN Meds: acetaminophen, alum & mag hydroxide-simeth, bisacodyl, calcium carbonate, glycopyrrolate, guaiFENesin-dextromethorphan, haloperidol lactate, LORazepam, morphine injection, morphine CONCENTRATE, nystatin, ondansetron (ZOFRAN) IV, ondansetron  Physical Exam Constitutional:      Comments: Appears restless/agitated  Pulmonary:     Effort: Pulmonary effort is normal.  Skin:    General: Skin is warm and dry.  Neurological:     Mental Status: She is alert. She is disoriented.  Psychiatric:        Behavior: Behavior is agitated.        Cognition and Memory: Cognition is impaired.        Judgment: Judgment is impulsive and inappropriate.             Vital Signs: BP 94/74 (BP Location: Left Leg)   Pulse 98   Temp (!) 97.5 F (36.4 C) (Oral)   Resp 20   Ht 5\' 2"  (1.575 m)   Wt 63.5 kg   SpO2 98%   BMI 25.61 kg/m  SpO2: SpO2: 98 % O2 Device: O2 Device: Room Air O2 Flow Rate: O2 Flow Rate (L/min): 3 L/min  Intake/output summary: No intake or output data in the 24 hours ending 03/26/20 1243 LBM: Last BM Date: 03/25/20 Baseline Weight: Weight: 63.5 kg Most recent weight: Weight: 63.5 kg       Palliative Assessment/Data: PPS 20%    Flowsheet Rows     Most  Recent Value  Intake Tab  Referral Department  Hospitalist  Unit at Time of Referral  Cardiac/Telemetry Unit  Palliative Care Primary Diagnosis  Neurology  Palliative Care Type  New Palliative care  Reason for referral  Clarify Goals of Care  Date first seen by Palliative Care  03/22/20  Clinical Assessment  Palliative Performance Scale Score  20%  Psychosocial & Spiritual Assessment  Palliative Care Outcomes  Patient/Family meeting held?  Yes  Who was at the meeting?  three sons [name], [place])  Palliative Care Outcomes  Improved pain interventions, Improved non-pain symptom therapy, Clarified goals of care, Counseled regarding hospice, Provided end of life care assistance, Provided psychosocial or spiritual support, Changed to focus on comfort, Transitioned to hospice, ACP counseling assistance      Patient Active Problem List   Diagnosis Date Noted  . Chronic respiratory failure with hypoxia ([place])   . Advanced dementia ([place])   . Parkinson's disease ([place])   . Palliative care by specialist   . DNR (do not resuscitate)   . Eosinophilia 01/06/2019  . Terminal care 01/06/2019  . IgM monoclonal gammopathy of uncertain significance 12/22/2018  . Macrocytic anemia 12/15/2018  . CHF (congestive heart failure) ([place]) 10/21/2017  . Sepsis ([place]) 07/20/2016  .  CAP (community acquired pneumonia) 07/20/2016  . Elevated troponin 07/20/2016  . CKD (chronic kidney disease), stage IV ([place]) 07/17/2008  . Diabetes ([place]) 06/12/2008  . OSTEOPENIA 06/12/2008  . Essential hypertension 10/12/2007  . INTERSTITIAL LUNG DISEASE 10/12/2007  . OTH SPEC ALVEOL&PARIETOALVEOL PNEUMONOPATHIES 10/12/2007  . HYPERGLYCEMIA 10/12/2007    Palliative Care Assessment & Plan   HPI: 86 y.o. female  with past medical history of Parkinson's disease, Lewy body dementia, chronic respiratory failure from ILD on 2L nasal cannula, hypertension, DM, thyroid disease admitted on 03/20/2020 with altered mental status  and poor oral intake for several days. Community-acquired pneumonia ruled out with CT chest revealing no consolidation or signs of infection. Antibiotics discontinued. DNR code status. IVF on hold and lasix 40mg  IV x1 ordered 5/28 AM with worsening crackles on auscultation. Palliative medicine consultation for goals of care.   Assessment: RN requested visit for symptom management.  Patient appears agitated, states she hurts all over. RN administered PO morphine about 2 hours prior to visit and 0.5 mg IV morphine 5 minutes prior to visit.   Discussed symptom management with son at bedside - he is agreeable to utilizing ordered ativan and increasing morphine dose to promote comfort. Discussed with RN.  Discussed hospice facility placement and type of care provided. Son is agreeable. All questions and concerns addressed.  Recommendations/Plan: IV morphine increased to 2 mg Requested ativan administration Hospice facility when bed available  Goals of Care and Additional Recommendations: Limitations on Scope of Treatment: Full Comfort Care  Code Status: DNR  Prognosis:  < 2 weeks  Discharge Planning: Hospice facility  Care plan was discussed with RN, Dr. Billie Ruddy, patient's son  Thank you for allowing the Palliative Medicine Team to assist in the care of this patient.   Total Time 15 minutes Prolonged Time Billed  no       Greater than 50%  of this time was spent counseling and coordinating care related to the above assessment and plan.  Juel Burrow, DNP, [facility] Palliative Medicine Team Team Phone # [phone]  Pager [phone]

## 2020-03-26 NOTE — Progress Notes (Addendum)
[place] [facility])  Hospital Liaison RN Note:   Received request from Ihor Dow, NP for family interest in [facility]. Spoke with son, Legrand Como in the room to acknowledge referral.   Chart reviewed and has been approved for Hospice Home.   Unfortunately, Hospice Home is not able to offer a room today. Legrand Como and [facility] [place] [facility] aware. [facility] Liaison will follow up tomorrow or sooner if room becomes available.  Please call for any hospice related questions or concerns.  Thank you for the opportunity to participate in this patients care.  Zandra Abts, RN [facility] Liaison  [phone]

## 2020-03-26 NOTE — Progress Notes (Signed)
PROGRESS NOTE    [name] [name]  RWE:[id number] DOB: [birth year] DOA: 03/20/2020 PCP: Gayland Curry, MD    Assessment & Plan:   Principal Problem:   CAP (community acquired pneumonia) Active Problems:   Diabetes ([place])   Essential hypertension   INTERSTITIAL LUNG DISEASE   CKD (chronic kidney disease), stage IV ([place])   Terminal care   Chronic respiratory failure with hypoxia ([place])   Advanced dementia ([place])   Parkinson's disease ([place])   Palliative care by specialist   DNR (do not resuscitate)   Comfort measures only status   Agitation   Pain, generalized    [name] [name] is a 86 y.o. Caucasian female with medical history significant for Lewy body dementia, interstitial lung disease with chronic respiratory failure on 2 L of oxygen and Parkinson's disease who was brought into the hospital by EMS for evaluation of mental status changes.   # AMS  # Lewy body dementia with Parkinson features # End of life and comfort care --Pt has been having rapid decline, and stopped eating and drinking for several days prior to presentation --palliative consult 5/28, pt made comfort care --home Sinemet and Seroquel d/c'ed PLAN: --Hold MIVF --IV morphine, IV ativan, and IV haldol PRN  Hypoglycemia, POA, resolved --BG 47 on presentation.  likely due to poor PO intake.  Hypothermia, POA, resolved --likely due to hypoglycemia  Community-acquired pneumonia, ruled out Patient was hypothermic when she arrived, and was placed empirically on Rocephin and Zithromax, however, CT chest showed no consolidation or signs of infection.  Procal 0.14.  Cough with brown sputum production is baseline, per son.  --abx d/c'ed  History of interstitial lung disease with chronic respiratory failure --O2 requirement at baseline. --Continue oxygen supplementation at 2 L for comfort  Diabetes mellitus with complications of stage IV chronic kidney disease  Hypertension D/c BP meds now that pt  is comfort care  Hypothyroidism D/c Synthroid now that pt is comfrot care.   DVT prophylaxis: None:Comfort Care Code Status: DNR comfort care Family Communication: son updated at bedside today Status is: inpatient Dispo:   The patient is from: home Anticipated d/c is to: hospice facility Anticipated d/c date is: when bed available Patient currently is medically stable to d/c due to: approaching end of life, need to be discharged to hospice facility (family not able to provide care at home).   Subjective and Interval History:  Pt noted by son to making moaning noises, so pain meds given.  No fever, N/V/D.     Objective: Vitals:   03/24/20 2011 03/25/20 1012 03/25/20 2301 03/25/20 2303  BP: 105/79  94/74   Pulse: (!) 132 (!) 107 98   Resp: 18 20 20    Temp: 97.7 F (36.5 C)   (!) 97.5 F (36.4 C)  TempSrc: Oral   Oral  SpO2: 97% 93% 98%   Weight:      Height:       No intake or output data in the 24 hours ending 03/26/20 1521 Filed Weights   03/20/20 1051  Weight: 63.5 kg    Examination:   Constitutional: NAD, somnolent today, not responsive today HEENT: conjunctivae and lids normal, EOMI CV: RRR 3+ systolic murmur at upper sternal boarder. Distal pulses +2.  No cyanosis.   RESP: loud Velcro-like crackles, normal respiratory effort, on RA GI: +BS, NTND Extremities: No effusions, edema, or tenderness in BLE SKIN: warm.  Erythema under the groin skin fold.  Extensive bruising, thin skin.   Data Reviewed:  I have personally reviewed following labs and imaging studies  CBC: Recent Labs  Lab 03/20/20 1042 03/21/20 1142 03/22/20 0513  WBC 9.0 9.2 8.5  NEUTROABS 4.6  --   --   HGB 11.1* 10.8* 10.9*  HCT 35.6* 34.1* 33.4*  MCV 97.3 96.9 93.6  PLT 147* 170 782*   Basic Metabolic Panel: Recent Labs  Lab 03/20/20 1042 03/21/20 0338 03/22/20 0513  NA 133* 134* 136  K 4.2 5.1 4.5  CL 97* 100 99  CO2 26 22 27   GLUCOSE 47* 111* 195*  BUN 42* 28* 28*   CREATININE 1.76* 1.27* 1.14*  CALCIUM 9.9 9.2 9.4  MG  --   --  1.7   GFR: Estimated Creatinine Clearance: 29.9 mL/min (A) (by C-G formula based on SCr of 1.14 mg/dL (H)). Liver Function Tests: Recent Labs  Lab 03/20/20 1042  AST 28  ALT <5  ALKPHOS 52  BILITOT 0.7  PROT 8.4*  ALBUMIN 2.9*   Recent Labs  Lab 03/20/20 1042  LIPASE 25   No results for input(s): AMMONIA in the last 168 hours. Coagulation Profile: Recent Labs  Lab 03/20/20 1042  INR 1.1   Cardiac Enzymes: No results for input(s): CKTOTAL, CKMB, CKMBINDEX, TROPONINI in the last 168 hours. BNP (last 3 results) No results for input(s): PROBNP in the last 8760 hours. HbA1C: No results for input(s): HGBA1C in the last 72 hours. CBG: Recent Labs  Lab 03/21/20 1235 03/21/20 2335 03/22/20 0545 03/22/20 1137  GLUCAP 185* 159* 190* 182*   Lipid Profile: No results for input(s): CHOL, HDL, LDLCALC, TRIG, CHOLHDL, LDLDIRECT in the last 72 hours. Thyroid Function Tests: No results for input(s): TSH, T4TOTAL, FREET4, T3FREE, THYROIDAB in the last 72 hours. Anemia Panel: No results for input(s): VITAMINB12, FOLATE, FERRITIN, TIBC, IRON, RETICCTPCT in the last 72 hours. Sepsis Labs: Recent Labs  Lab 03/20/20 1042 03/22/20 0513  PROCALCITON 0.14 0.16  LATICACIDVEN 0.7  --     Recent Results (from the past 240 hour(s))  Blood Culture (routine x 2)     Status: None   Collection Time: 03/20/20 10:42 AM   Specimen: BLOOD  Result Value Ref Range Status   Specimen Description BLOOD RIGHT HAND  Final   Special Requests   Final    BOTTLES DRAWN AEROBIC AND ANAEROBIC Blood Culture adequate volume   Culture   Final    NO GROWTH 5 DAYS Performed at [facility], [address], [place], [place] [address]    Report Status 03/25/2020 FINAL  Final  Blood Culture (routine x 2)     Status: Abnormal   Collection Time: 03/20/20 10:42 AM   Specimen: BLOOD  Result Value Ref Range Status   Specimen  Description   Final    BLOOD RIGHT FAS Performed at [facility], [address] [place], [place] [address]    Special Requests   Final    BOTTLES DRAWN AEROBIC AND ANAEROBIC Blood Culture adequate volume Performed at [facility], [address] [place], [place] [address]    Culture  Setup Time   Final    GRAM POSITIVE COCCI Organism ID to follow AEROBIC BOTTLE ONLY CRITICAL RESULT CALLED TO, READ BACK BY AND VERIFIED WITH: SCOTT HALL AT [place] 03/21/20 [place] Performed at [facility], [address] [place], [place] [address]    Culture (A)  Final    STAPHYLOCOCCUS SPECIES (COAGULASE NEGATIVE) THE SIGNIFICANCE OF ISOLATING THIS ORGANISM FROM A SINGLE SET OF BLOOD CULTURES  WHEN MULTIPLE SETS ARE DRAWN IS UNCERTAIN. PLEASE NOTIFY THE MICROBIOLOGY DEPARTMENT WITHIN ONE WEEK IF SPECIATION AND SENSITIVITIES ARE REQUIRED. Performed at [facility], [place] [address], [place], [place] [address]    Report Status 03/22/2020 FINAL  Final  Urine culture     Status: None   Collection Time: 03/20/20 10:42 AM   Specimen: In/Out Cath Urine  Result Value Ref Range Status   Specimen Description   Final    IN/OUT CATH URINE Performed at [facility], [address] [place], [place] [address]    Special Requests   Final    NONE Performed at [facility], [address], [place], [place] [address]    Culture   Final    NO GROWTH Performed at [facility], [place] [address], [place], [place] [address]    Report Status 03/21/2020 FINAL  Final  Blood Culture ID Panel (Reflexed)     Status: Abnormal   Collection Time: 03/20/20 10:42 AM  Result Value Ref Range Status   Enterococcus species NOT DETECTED NOT DETECTED Final   Listeria monocytogenes NOT DETECTED NOT DETECTED Final   Staphylococcus species DETECTED (A) NOT DETECTED Final    Comment: Methicillin (oxacillin) susceptible coagulase negative staphylococcus. Possible blood culture  contaminant (unless isolated from more than one blood culture draw or clinical case suggests pathogenicity). No antibiotic treatment is indicated for blood  culture contaminants. CRITICAL RESULT CALLED TO, READ BACK BY AND VERIFIED WITH: SCOTT HALL AT 0505 03/21/20 S[place]    Staphylococcus aureus (BCID) NOT DETECTED NOT DETECTED Final   Methicillin resistance NOT DETECTED NOT DETECTED Final   Streptococcus species NOT DETECTED NOT DETECTED Final   Streptococcus agalactiae NOT DETECTED NOT DETECTED Final   Streptococcus pneumoniae NOT DETECTED NOT DETECTED Final   Streptococcus pyogenes NOT DETECTED NOT DETECTED Final   Acinetobacter baumannii NOT DETECTED NOT DETECTED Final   Enterobacteriaceae species NOT DETECTED NOT DETECTED Final   Enterobacter cloacae complex NOT DETECTED NOT DETECTED Final   Escherichia coli NOT DETECTED NOT DETECTED Final   Klebsiella oxytoca NOT DETECTED NOT DETECTED Final   Klebsiella pneumoniae NOT DETECTED NOT DETECTED Final   Proteus species NOT DETECTED NOT DETECTED Final   Serratia marcescens NOT DETECTED NOT DETECTED Final   Haemophilus influenzae NOT DETECTED NOT DETECTED Final   Neisseria meningitidis NOT DETECTED NOT DETECTED Final   Pseudomonas aeruginosa NOT DETECTED NOT DETECTED Final   Candida albicans NOT DETECTED NOT DETECTED Final   Candida glabrata NOT DETECTED NOT DETECTED Final   Candida krusei NOT DETECTED NOT DETECTED Final   Candida parapsilosis NOT DETECTED NOT DETECTED Final   Candida tropicalis NOT DETECTED NOT DETECTED Final    Comment: Performed at [facility], [address] [place], [place] [address]  SARS Coronavirus 2 by RT PCR (hospital order, performed in [place] hospital lab) Nasopharyngeal Nasopharyngeal Swab     Status: None   Collection Time: 03/20/20 11:29 AM   Specimen: Nasopharyngeal Swab  Result Value Ref Range Status   SARS Coronavirus 2 NEGATIVE NEGATIVE Final    Comment: (NOTE) SARS-CoV-2 target  nucleic acids are NOT DETECTED. The SARS-CoV-2 RNA is generally detectable in upper and lower respiratory specimens during the acute phase of infection. The lowest concentration of SARS-CoV-2 viral copies this assay can detect is 250 copies / mL. A negative result does not preclude SARS-CoV-2 infection and should not be used as the sole basis for treatment or other patient management decisions.  A negative result may occur with improper specimen collection / handling, submission of specimen other than nasopharyngeal swab, presence of viral mutation(s) within the areas targeted by this assay, and inadequate number of viral copies (<250 copies / mL). A negative result must be combined with clinical observations, patient history, and epidemiological information. Fact Sheet for Patients:   StrictlyIdeas.no Fact Sheet for Healthcare Providers: BankingDealers.co.za This test is not yet approved or cleared  by the [place] FDA and has been authorized for detection and/or diagnosis of SARS-CoV-2 by FDA under an Emergency Use Authorization (EUA).  This EUA will remain in effect (meaning this test can be used) for the duration of the COVID-19 declaration under Section 564(b)(1) of the Act, 21 U.S.C. section 360bbb-3(b)(1), unless the authorization is terminated or revoked sooner. Performed at [facility], [address], [place], [place] [address]       Radiology Studies: No results found.   Scheduled Meds:  triamcinolone cream  1 application Topical BID   Continuous Infusions:    LOS: 6 days     Enzo Bi, MD [facility] If 7PM-7AM, please contact night-coverage 03/26/2020, 3:21 PM

## 2020-03-26 NOTE — TOC Progression Note (Signed)
Transition of Care [facility]) - Progression Note    Patient Details  Name: [name] [name] MRN: [id number] Date of Birth: 24-Apr-1929  Transition of Care [facility]) CM/SW Contact  Candie Chroman, LCSW Phone Number: 03/26/2020, 11:33 AM  Clinical Narrative: No bed at [place] today. CSW notified son, Ronalee Belts, at bedside. Discussed potential for facility in other county. He stated that all family is in [place]. Provided list of other hospice houses in surrounding counties in case there is no bed at [facility] for an extended period of time.    Expected Discharge Plan and Services                                                 Social Determinants of Health (SDOH) Interventions    Readmission Risk Interventions No flowsheet data found.

## 2020-03-27 MED ORDER — LORAZEPAM 2 MG/ML IJ SOLN
1.0000 mg | INTRAMUSCULAR | Status: DC | PRN
  Administered 2020-03-27 – 2020-03-28 (×4): 1 mg via SUBLINGUAL
  Filled 2020-03-27 (×5): qty 1

## 2020-03-27 MED ORDER — LORAZEPAM 2 MG/ML PO CONC: 1.0000 mg | ORAL | Status: DC | PRN

## 2020-03-27 MED ORDER — LORAZEPAM 2 MG/ML IJ SOLN
1.0000 mg | Freq: Four times a day (QID) | INTRAMUSCULAR | Status: DC | PRN
  Filled 2020-03-27: qty 1

## 2020-03-27 NOTE — Progress Notes (Signed)
[facility]) Hospital Liaison RN note:  Unfortunately, Hospice Home is not able to offer a room today. Mortimer Fries and TOC [place] Scrape aware. [facility] Liaison will follow up tomorrow or sooner if room becomes available.  Please call for any hospice related questions or concerns.  Thank you for the opportunity to participate in this patients care.  Thank you, Margaretmary Eddy, BSN, RN [facility] Liaison [phone]

## 2020-03-27 NOTE — TOC Progression Note (Signed)
Transition of Care [facility]) - Progression Note    Patient Details  Name: [name] [name] MRN: [id number] Date of Birth: 1929-07-13  Transition of Care [facility]) CM/SW Contact  Candie Chroman, LCSW Phone Number: 03/27/2020, 10:12 AM  Clinical Narrative:  No bed at the hospice house today.   Expected Discharge Plan and Services                                                 Social Determinants of Health (SDOH) Interventions    Readmission Risk Interventions No flowsheet data found.

## 2020-03-27 NOTE — Progress Notes (Signed)
Daily Progress Note   Patient Name: [name] [name]       Date: 03/27/2020 DOB: February 18, 1929  Age: 86 y.o. MRN#: [id number] Attending Physician: Sidney Ace, MD Primary Care Physician: Gayland Curry, MD Admit Date: 03/20/2020  Reason for Consultation/Follow-up: Terminal Care  Subjective: 2 sons at bedside Patient appears less agitated today, does open eyes but no verbal interaction - family reports she was moaning some early this morning but stopped without intervention and has appeared comfortable since  Length of Stay: 7  Current Medications: Scheduled Meds:  . triamcinolone cream  1 application Topical BID    Continuous Infusions:   PRN Meds: acetaminophen, alum & mag hydroxide-simeth, bisacodyl, calcium carbonate, glycopyrrolate, guaiFENesin-dextromethorphan, haloperidol lactate, LORazepam, LORazepam, morphine injection, morphine CONCENTRATE, nystatin, ondansetron (ZOFRAN) IV, ondansetron  Physical Exam Constitutional:      Comments: Opens eyes to voice, appears comfortable  Pulmonary:     Effort: Pulmonary effort is normal. No respiratory distress.  Skin:    General: Skin is warm and dry.  Neurological:     Mental Status: She is alert. She is disoriented.  Psychiatric:        Cognition and Memory: Cognition is impaired.        Judgment: Judgment is impulsive.             Vital Signs: BP 94/74 (BP Location: Left Leg)   Pulse 98   Temp (!) 97.5 F (36.4 C) (Oral)   Resp 20   Ht 5\' 2"  (1.575 m)   Wt 63.5 kg   SpO2 98%   BMI 25.61 kg/m  SpO2: SpO2: 98 % O2 Device: O2 Device: Room Air O2 Flow Rate: O2 Flow Rate (L/min): 3 L/min  Intake/output summary:   Intake/Output Summary (Last 24 hours) at 03/27/2020 1040 Last data filed at 03/26/2020 2115 Gross per 24 hour   Intake 0 ml  Output --  Net 0 ml   LBM: Last BM Date: 03/26/20 Baseline Weight: Weight: 63.5 kg Most recent weight: Weight: 63.5 kg       Palliative Assessment/Data: PPS 20%    Flowsheet Rows     Most Recent Value  Intake Tab  Referral Department  Hospitalist  Unit at Time of Referral  Cardiac/Telemetry Unit  Palliative Care Primary Diagnosis  Neurology  Palliative Care Type  New Palliative care  Reason for referral  Clarify Goals of Care  Date first seen by Palliative Care  03/22/20  Clinical Assessment  Palliative Performance Scale Score  20%  Psychosocial & Spiritual Assessment  Palliative Care Outcomes  Patient/Family meeting held?  Yes  Who was at the meeting?  three sons [name], Coralee [place])  Palliative Care Outcomes  Improved pain interventions, Improved non-pain symptom therapy, Clarified goals of care, Counseled regarding hospice, Provided end of life care assistance, Provided psychosocial or spiritual support, Changed to focus on comfort, Transitioned to hospice, ACP counseling assistance      Patient Active Problem List   Diagnosis Date Noted  . Hypoglycemia 03/26/2020  . Lewy body dementia ([place]) 03/26/2020  . Hypothermia 03/26/2020  . Hypothyroid 03/26/2020  . Comfort measures only status   . Agitation   . Pain, generalized   . Chronic  respiratory failure with hypoxia ([place])   . Advanced dementia ([place])   . Parkinson's disease ([place])   . Palliative care by specialist   . DNR (do not resuscitate)   . Eosinophilia 01/06/2019  . Terminal care 01/06/2019  . IgM monoclonal gammopathy of uncertain significance 12/22/2018  . Macrocytic anemia 12/15/2018  . CHF (congestive heart failure) ([place]) 10/21/2017  . Sepsis ([place]) 07/20/2016  . Elevated troponin 07/20/2016  . CKD (chronic kidney disease), stage IV ([place]) 07/17/2008  . Diabetes ([place]) 06/12/2008  . OSTEOPENIA 06/12/2008  . Essential hypertension 10/12/2007  . INTERSTITIAL LUNG DISEASE 10/12/2007  . OTH  SPEC ALVEOL&PARIETOALVEOL PNEUMONOPATHIES 10/12/2007  . HYPERGLYCEMIA 10/12/2007    Palliative Care Assessment & Plan   HPI: 86 y.o. female  with past medical history of Parkinson's disease, Lewy body dementia, chronic respiratory failure from ILD on 2L nasal cannula, hypertension, DM, thyroid disease admitted on 03/20/2020 with altered mental status and poor oral intake for several days. Community-acquired pneumonia ruled out with CT chest revealing no consolidation or signs of infection. Antibiotics discontinued. DNR code status. IVF on hold and lasix 40mg  IV x1 ordered 5/28 AM with worsening crackles on auscultation. Palliative medicine consultation for goals of care.   Assessment: Reviewed symptom management plan with family - they express understanding and agree. Discussed when to use morphine and ativan and how it will affect patient. Remain interested in hospice facility placement.  Recommendations/Plan: IV morphine increased to 2 mg Added sublingual ativan Hospice facility when bed available  Goals of Care and Additional Recommendations: Limitations on Scope of Treatment: Full Comfort Care  Code Status: DNR  Prognosis:  < 2 weeks  Discharge Planning: Hospice facility  Care plan was discussed with RN and patient's sons at bedside  Thank you for allowing the Palliative Medicine Team to assist in the care of this patient.   Total Time 15 minutes Prolonged Time Billed  no       Greater than 50%  of this time was spent counseling and coordinating care related to the above assessment and plan.  Juel Burrow, DNP, [facility] Palliative Medicine Team Team Phone # [phone]  Pager [phone]

## 2020-03-27 NOTE — Progress Notes (Signed)
PROGRESS NOTE    [name] [name]  ZOX:[id number] DOB: 18-May-1929 DOA: 03/20/2020 PCP: Gayland Curry, MD   Brief Narrative:  [name] [name] a 86 y.o. Caucasian femalewith medical history significant forLewy body dementia,interstitial lung disease withchronic respiratory failure on 2 L of oxygen and Parkinson's disease who was brought into the hospital by EMS for evaluation of mental status changes. Currently on comfort measures.  Awaiting hospice facility placement.  6/2: Patient seen.  Physical exam deferred.  To family members at bedside.  State patient was somewhat agitated last night but, this morning.  Followed by palliative care.   Assessment & Plan:   Active Problems:   Diabetes ([place])   Essential hypertension   INTERSTITIAL LUNG DISEASE   CKD (chronic kidney disease), stage IV (HCC)   Terminal care   Chronic respiratory failure with hypoxia (HCC)   Advanced dementia (HCC)   Parkinson's disease ([place])   Palliative care by specialist   DNR (do not resuscitate)   Comfort measures only status   Agitation   Pain, generalized   Hypoglycemia   Lewy body dementia ([place])   Hypothermia   Hypothyroid  # AMS  # Lewy body dementia with Parkinson features # End of life and comfort care --Pt has been having rapid decline, and stopped eating and drinking for several days prior to presentation --palliative consult 5/28, pt made comfort care --home Sinemet and Seroquel d/c'ed PLAN: --Hold MIVF --IV morphine, IV ativan, and IV haldol PRN  Hypoglycemia, POA, resolved --BG 47 on presentation.  likely due to poor PO intake.  Hypothermia, POA, resolved --likely due to hypoglycemia  Community-acquired pneumonia, ruled out Patient was hypothermic when she arrived, and was placed empirically on Rocephin and Zithromax, however, CT chest showed no consolidation or signs of infection.  Procal 0.14.  Cough with brown sputum production is baseline, per son.  --abx  d/c'ed  History of interstitial lung disease with chronic respiratory failure --O2 requirement at baseline. --Continue oxygen supplementation at 2 L for comfort  Diabetes mellituswith complications of stage IV chronic kidney disease  Hypertension D/c BP meds now that pt is comfort care  Hypothyroidism D/c Synthroid now that pt is comfrot care.   DVT prophylaxis: Comfort measures Code Status: DNR Family Communication: 2 family members at bedside Disposition Plan: Hospice facility when bed available  Status is: Inpatient  Remains inpatient appropriate because:Unsafe d/c plan  Consultants:   Palliative care  Procedures:   None  Antimicrobials:  None   Subjective: Patient seen, 2 family members at bedside.  Physical exam deferred given comfort measures status.  Patient verbally unresponsive  Objective: Vitals:   03/24/20 2011 03/25/20 1012 03/25/20 2301 03/25/20 2303  BP: 105/79  94/74   Pulse: (!) 132 (!) 107 98   Resp: 18 20 20    Temp: 97.7 F (36.5 C)   (!) 97.5 F (36.4 C)  TempSrc: Oral   Oral  SpO2: 97% 93% 98%   Weight:      Height:        Intake/Output Summary (Last 24 hours) at 03/27/2020 1356 Last data filed at 03/26/2020 2115 Gross per 24 hour  Intake 0 ml  Output --  Net 0 ml   Filed Weights   03/20/20 1051  Weight: 63.5 kg    Examination:  Physical exam deferred given comfort measures status.  Patient visibly no distress    Data Reviewed: I have personally reviewed following labs and imaging studies  CBC: Recent Labs  Lab 03/21/20 1142  03/22/20 0513  WBC 9.2 8.5  HGB 10.8* 10.9*  HCT 34.1* 33.4*  MCV 96.9 93.6  PLT 170 387*   Basic Metabolic Panel: Recent Labs  Lab 03/21/20 0338 03/22/20 0513  NA 134* 136  K 5.1 4.5  CL 100 99  CO2 22 27  GLUCOSE 111* 195*  BUN 28* 28*  CREATININE 1.27* 1.14*  CALCIUM 9.2 9.4  MG  --  1.7   GFR: Estimated Creatinine Clearance: 29.9 mL/min (A) (by C-G formula based on SCr  of 1.14 mg/dL (H)). Liver Function Tests: No results for input(s): AST, ALT, ALKPHOS, BILITOT, PROT, ALBUMIN in the last 168 hours. No results for input(s): LIPASE, AMYLASE in the last 168 hours. No results for input(s): AMMONIA in the last 168 hours. Coagulation Profile: No results for input(s): INR, PROTIME in the last 168 hours. Cardiac Enzymes: No results for input(s): CKTOTAL, CKMB, CKMBINDEX, TROPONINI in the last 168 hours. BNP (last 3 results) No results for input(s): PROBNP in the last 8760 hours. HbA1C: No results for input(s): HGBA1C in the last 72 hours. CBG: Recent Labs  Lab 03/21/20 1235 03/21/20 2335 03/22/20 0545 03/22/20 1137  GLUCAP 185* 159* 190* 182*   Lipid Profile: No results for input(s): CHOL, HDL, LDLCALC, TRIG, CHOLHDL, LDLDIRECT in the last 72 hours. Thyroid Function Tests: No results for input(s): TSH, T4TOTAL, FREET4, T3FREE, THYROIDAB in the last 72 hours. Anemia Panel: No results for input(s): VITAMINB12, FOLATE, FERRITIN, TIBC, IRON, RETICCTPCT in the last 72 hours. Sepsis Labs: Recent Labs  Lab 03/22/20 0513  PROCALCITON 0.16    Recent Results (from the past 240 hour(s))  Blood Culture (routine x 2)     Status: None   Collection Time: 03/20/20 10:42 AM   Specimen: BLOOD  Result Value Ref Range Status   Specimen Description BLOOD RIGHT HAND  Final   Special Requests   Final    BOTTLES DRAWN AEROBIC AND ANAEROBIC Blood Culture adequate volume   Culture   Final    NO GROWTH 5 DAYS Performed at [facility], [address] [place], [place] [address]    Report Status 03/25/2020 FINAL  Final  Blood Culture (routine x 2)     Status: Abnormal   Collection Time: 03/20/20 10:42 AM   Specimen: BLOOD  Result Value Ref Range Status   Specimen Description   Final    BLOOD RIGHT FAS Performed at [facility], [address] [place], [place] [address]    Special Requests   Final    BOTTLES DRAWN AEROBIC AND ANAEROBIC  Blood Culture adequate volume Performed at [facility], [address] [place], [place] [address]    Culture  Setup Time   Final    GRAM POSITIVE COCCI Organism ID to follow AEROBIC BOTTLE ONLY CRITICAL RESULT CALLED TO, READ BACK BY AND VERIFIED WITH: SCOTT HALL AT [place] 03/21/20 [place] Performed at [facility], [address] [place], [place] [address]    Culture (A)  Final    STAPHYLOCOCCUS SPECIES (COAGULASE NEGATIVE) THE SIGNIFICANCE OF ISOLATING THIS ORGANISM FROM A SINGLE SET OF BLOOD CULTURES WHEN MULTIPLE SETS ARE DRAWN IS UNCERTAIN. PLEASE NOTIFY THE MICROBIOLOGY DEPARTMENT WITHIN ONE WEEK IF SPECIATION AND SENSITIVITIES ARE REQUIRED. Performed at [facility], [place] [address], [place], [place] [address]    Report Status 03/22/2020 FINAL  Final  Urine culture     Status: None   Collection Time: 03/20/20 10:42 AM   Specimen: In/Out Cath Urine  Result Value Ref Range Status   Specimen Description   Final    IN/OUT CATH URINE Performed at [facility], [address], [place], [place] [address]    Special Requests   Final    NONE Performed at [facility], [address], [place], [place] [address]    Culture   Final    NO GROWTH Performed at [facility], [place] [address] [place], [place] [address]    Report Status 03/21/2020 FINAL  Final  Blood Culture ID Panel (Reflexed)     Status: Abnormal   Collection Time: 03/20/20 10:42 AM  Result Value Ref Range Status   Enterococcus species NOT DETECTED NOT DETECTED Final   Listeria monocytogenes NOT DETECTED NOT DETECTED Final   Staphylococcus species DETECTED (A) NOT DETECTED Final    Comment: Methicillin (oxacillin) susceptible coagulase negative staphylococcus. Possible blood culture contaminant (unless isolated from more than one blood culture draw or clinical case suggests pathogenicity). No antibiotic treatment is indicated for blood  culture contaminants. CRITICAL  RESULT CALLED TO, READ BACK BY AND VERIFIED WITH: SCOTT HALL AT 0505 03/21/20 SDR    Staphylococcus aureus (BCID) NOT DETECTED NOT DETECTED Final   Methicillin resistance NOT DETECTED NOT DETECTED Final   Streptococcus species NOT DETECTED NOT DETECTED Final   Streptococcus agalactiae NOT DETECTED NOT DETECTED Final   Streptococcus pneumoniae NOT DETECTED NOT DETECTED Final   Streptococcus pyogenes NOT DETECTED NOT DETECTED Final   Acinetobacter baumannii NOT DETECTED NOT DETECTED Final   Enterobacteriaceae species NOT DETECTED NOT DETECTED Final   Enterobacter cloacae complex NOT DETECTED NOT DETECTED Final   Escherichia coli NOT DETECTED NOT DETECTED Final   Klebsiella oxytoca NOT DETECTED NOT DETECTED Final   Klebsiella pneumoniae NOT DETECTED NOT DETECTED Final   Proteus species NOT DETECTED NOT DETECTED Final   Serratia marcescens NOT DETECTED NOT DETECTED Final   Haemophilus influenzae NOT DETECTED NOT DETECTED Final   Neisseria meningitidis NOT DETECTED NOT DETECTED Final   Pseudomonas aeruginosa NOT DETECTED NOT DETECTED Final   Candida albicans NOT DETECTED NOT DETECTED Final   Candida glabrata NOT DETECTED NOT DETECTED Final   Candida krusei NOT DETECTED NOT DETECTED Final   Candida parapsilosis NOT DETECTED NOT DETECTED Final   Candida tropicalis NOT DETECTED NOT DETECTED Final    Comment: Performed at [facility], [address] [place], [place] [address]  SARS Coronavirus 2 by RT PCR (hospital order, performed in [place] hospital lab) Nasopharyngeal Nasopharyngeal Swab     Status: None   Collection Time: 03/20/20 11:29 AM   Specimen: Nasopharyngeal Swab  Result Value Ref Range Status   SARS Coronavirus 2 NEGATIVE NEGATIVE Final    Comment: (NOTE) SARS-CoV-2 target nucleic acids are NOT DETECTED. The SARS-CoV-2 RNA is generally detectable in upper and lower respiratory specimens during the acute phase of infection. The lowest concentration of SARS-CoV-2  viral copies this assay can detect is 250 copies / mL. A negative result does not preclude SARS-CoV-2 infection and should not be used as the sole basis for treatment or other patient management decisions.  A negative result may occur with improper specimen collection / handling, submission of specimen other than nasopharyngeal swab, presence of viral mutation(s) within the areas targeted by this assay, and inadequate number of viral copies (<250 copies / mL). A negative result must be combined with clinical observations, patient history, and epidemiological information. Fact Sheet for Patients:   StrictlyIdeas.no Fact Sheet for Healthcare Providers: BankingDealers.co.za  This test is not yet approved or cleared  by the [place] and has been authorized for detection and/or diagnosis of SARS-CoV-2 by FDA under an Emergency Use Authorization (EUA).  This EUA will remain in effect (meaning this test can be used) for the duration of the COVID-19 declaration under Section 564(b)(1) of the Act, 21 U.S.C. section 360bbb-3(b)(1), unless the authorization is terminated or revoked sooner. Performed at [facility], [address], [place], [place] [address]          Radiology Studies: No results found.      Scheduled Meds: . triamcinolone cream  1 application Topical BID   Continuous Infusions:   LOS: 7 days    Time spent: 25 minutes    Sidney Ace, MD [facility] Pager [phone]  If 7PM-7AM, please contact night-coverage 03/27/2020, 1:56 PM

## 2020-03-28 NOTE — TOC Transition Note (Signed)
Transition of Care [facility]) - CM/SW Discharge Note   Patient Details  Name: [name] [name] MRN: [id number] Date of Birth: 1929-08-10  [facility] CM/SW Contact:  Candie Chroman, LCSW Phone Number: 03/28/2020, 10:53 AM   Clinical Narrative:  Patient has orders to discharge to [facility] today. Hospice liaison will call report and set up EMS transport. No further concerns. CSW signing off.   Final next level of care: [place] Barriers to Discharge: Barriers Resolved   Patient Goals and CMS Choice     Choice offered to / list presented to : Adult Children  Discharge Placement              Patient chooses bed at: Other - please specify in the comment section below:([facility]) Patient to be transferred to facility by: EMS   Patient and family notified of of transfer: 03/28/20  Discharge Plan and Services                                     Social Determinants of Health (SDOH) Interventions     Readmission Risk Interventions No flowsheet data found.

## 2020-03-28 NOTE — Progress Notes (Signed)
[facility]) Hospital Liaison RN note  Hospice Home is able to offer a bed today. Son, [name] has left the hospital to sign consents.  Once notified, consents are completed, we will call for EMS.  [place] aware. Requested a discharge summary to be completed.  Please ensure DNR accompanies patient in transport to the Hospice Home.  Please call with any hospice questions or concerns.  Thank you, Margaretmary Eddy, BSN, RN [facility] Liaison [phone]

## 2020-03-28 NOTE — Progress Notes (Signed)
Both of patient's IVs infiltrated. Family requested that she does not get another IV. Patient receiving morphine and ativan SL. Doctor said that it was okay for patient to stay without an IV.

## 2020-03-28 NOTE — Progress Notes (Signed)
[name] [name] to be D/C'd to [facility] by EMS per MD order.  Discussed prescriptions and follow up appointments with the patient. Prescriptions given to patient, medication list explained in detail. Pt verbalized understanding.  Allergies as of 03/28/2020       Reactions   Fluoxetine    Other reaction(s): Other (See Comments) Drowsy-wanted to sleep all day   Ace Inhibitors Cough, Other (See Comments)    hyperkalemia  hyperkalemia   Codeine Nausea Only   Sulfonamide Derivatives Other (See Comments)   Reaction: unknown        Medication List     STOP taking these medications    allopurinol 300 MG tablet Commonly known as: ZYLOPRIM   amLODipine 2.5 MG tablet Commonly known as: NORVASC   aspirin EC 81 MG tablet   carbidopa-levodopa 25-100 MG tablet Commonly known as: SINEMET IR   Carbidopa-Levodopa ER 25-100 MG tablet controlled release Commonly known as: SINEMET CR   cholecalciferol 25 MCG (1000 UNIT) tablet Commonly known as: VITAMIN D   cloNIDine 0.1 MG tablet Commonly known as: CATAPRES   furosemide 40 MG tablet Commonly known as: LASIX   gabapentin 100 MG capsule Commonly known as: NEURONTIN   glipiZIDE 10 MG tablet Commonly known as: GLUCOTROL   insulin detemir 100 UNIT/ML injection Commonly known as: LEVEMIR   insulin lispro 100 UNIT/ML injection Commonly known as: HUMALOG   levothyroxine 100 MCG tablet Commonly known as: SYNTHROID   metoprolol succinate 100 MG 24 hr tablet Commonly known as: TOPROL-XL   multivitamin with minerals Tabs tablet   omega-3 acid ethyl esters 1 g capsule Commonly known as: LOVAZA   pravastatin 80 MG tablet Commonly known as: PRAVACHOL   QUEtiapine 25 MG tablet Commonly known as: SEROQUEL   ranitidine 150 MG tablet Commonly known as: ZANTAC   rOPINIRole 0.5 MG tablet Commonly known as: REQUIP   triamcinolone cream 0.1 % Commonly known as: KENALOG   vitamin B-12 1000 MCG tablet Commonly  known as: CYANOCOBALAMIN        Vitals:   03/25/20 2301 03/25/20 2303  BP: 94/74   Pulse: 98   Resp: 20   Temp:  (!) 97.5 F (36.4 C)  SpO2: 98%     Skin clean, dry and intact without evidence of skin break down, no evidence of skin tears noted. IV catheter discontinued intact. Site without signs and symptoms of complications. Dressing and pressure applied. Pt denies pain at this time. No complaints noted.  An After Visit Summary was printed and given to the patient. Patient escorted via [place], and D/C home via private auto.  [name] [name]

## 2020-03-28 NOTE — Discharge Summary (Signed)
Physician Discharge Summary  [name] [name] IRS:[id number] DOB: 05-15-1929 DOA: 03/20/2020  PCP: Gayland Curry, MD  Admit date: 03/20/2020 Discharge date: 03/28/2020  Admitted From: Home Disposition:  Residential hospice   Discharge Condition: Hospice CODE STATUS: Comfort care Diet recommendation: Dysphagia Brief/Interim Summary: [name] [name] a 86 y.o. Caucasian femalewith medical history significant forLewy body dementia,interstitial lung disease withchronic respiratory failure on 2 L of oxygen and Parkinson's disease who was brought into the hospital by EMS for evaluation of mental status changes. Currently on comfort measures.  Awaiting hospice facility placement.  6/2: Patient seen.  Physical exam deferred.  To family members at bedside.  State patient was somewhat agitated last night but, this morning.  Followed by palliative care.  6/3: Patient seen.  Exam deferred.  To family medicine at bedside.  Patient appears more calm today.  In bed.  Verbally unresponsive.  Visibly not in distress.  Accepted to inpatient hospice facility.  Discharge Diagnoses:  Active Problems:   Diabetes ([place])   Essential hypertension   INTERSTITIAL LUNG DISEASE   CKD (chronic kidney disease), stage IV (HCC)   Terminal care   Chronic respiratory failure with hypoxia (HCC)   Advanced dementia (HCC)   Parkinson's disease ([place])   Palliative care by specialist   DNR (do not resuscitate)   Comfort measures only status   Agitation   Pain, generalized   Hypoglycemia   Lewy body dementia ([place])   Hypothermia   Hypothyroid #AMS #Lewy body dementia with Parkinson features #End of life and comfort care --Pt has been having rapid decline, and stopped eating and drinking for several days prior to presentation --palliative consult 5/28, pt made comfort care --home Sinemet and Seroquel d/c'ed PLAN: --Hold MIVF --IV morphine, IV ativan, and IV haldol PRN --Dispo to inpatient  hospice  Hypoglycemia, POA, resolved --BG 47 on presentation. likely due to poor PO intake.  Hypothermia, POA, resolved --likely due to hypoglycemia  Community-acquired pneumonia, ruled out Patient was hypothermic when she arrived, and was placed empirically on Rocephin and Zithromax, however, CT chest showed no consolidation or signs of infection. Procal 0.14. Cough with brown sputum production is baseline, per son.  --abx d/c'ed  History of interstitial lung disease with chronic respiratory failure --O2 requirement at baseline. --Continue oxygen supplementation at 2 L for comfort  Diabetes mellituswith complications of stage IV chronic kidney disease  Hypertension D/c BP meds now that pt is comfort care  Hypothyroidism D/c Synthroid now that pt is comfrot care.   Discharge Instructions  Discharge Instructions    Diet - low sodium heart healthy   Complete by: As directed    Increase activity slowly   Complete by: As directed      Allergies as of 03/28/2020      Reactions   Fluoxetine    Other reaction(s): Other (See Comments) Drowsy-wanted to sleep all day   Ace Inhibitors Cough, Other (See Comments)    hyperkalemia  hyperkalemia   Codeine Nausea Only   Sulfonamide Derivatives Other (See Comments)   Reaction: unknown      Medication List    STOP taking these medications   allopurinol 300 MG tablet Commonly known as: ZYLOPRIM   amLODipine 2.5 MG tablet Commonly known as: NORVASC   aspirin EC 81 MG tablet   carbidopa-levodopa 25-100 MG tablet Commonly known as: SINEMET IR   Carbidopa-Levodopa ER 25-100 MG tablet controlled release Commonly known as: SINEMET CR   cholecalciferol 25 MCG (1000 UNIT) tablet Commonly known as:  VITAMIN D   cloNIDine 0.1 MG tablet Commonly known as: CATAPRES   furosemide 40 MG tablet Commonly known as: LASIX   gabapentin 100 MG capsule Commonly known as: NEURONTIN   glipiZIDE 10 MG tablet Commonly known  as: GLUCOTROL   insulin detemir 100 UNIT/ML injection Commonly known as: LEVEMIR   insulin lispro 100 UNIT/ML injection Commonly known as: HUMALOG   levothyroxine 100 MCG tablet Commonly known as: SYNTHROID   metoprolol succinate 100 MG 24 hr tablet Commonly known as: TOPROL-XL   multivitamin with minerals Tabs tablet   omega-3 acid ethyl esters 1 g capsule Commonly known as: LOVAZA   pravastatin 80 MG tablet Commonly known as: PRAVACHOL   QUEtiapine 25 MG tablet Commonly known as: SEROQUEL   ranitidine 150 MG tablet Commonly known as: ZANTAC   rOPINIRole 0.5 MG tablet Commonly known as: REQUIP   triamcinolone cream 0.1 % Commonly known as: KENALOG   vitamin B-12 1000 MCG tablet Commonly known as: CYANOCOBALAMIN       Allergies  Allergen Reactions  . Fluoxetine     Other reaction(s): Other (See Comments) Drowsy-wanted to sleep all day  . Ace Inhibitors Cough and Other (See Comments)     hyperkalemia  hyperkalemia   . Codeine Nausea Only  . Sulfonamide Derivatives Other (See Comments)    Reaction: unknown    Consultations:  Palliative care   Procedures/Studies: CT CHEST WO CONTRAST  Result Date: 03/20/2020 CLINICAL DATA:  Altered level of consciousness for 2 days, productive cough, sepsis EXAM: CT CHEST WITHOUT CONTRAST TECHNIQUE: Multidetector CT imaging of the chest was performed following the standard protocol without IV contrast. COMPARISON:  03/20/2020, 01/29/2007 FINDINGS: Cardiovascular: The heart is not enlarged. There is extensive calcification of the mitral annulus. Aortic valve prosthesis is identified. Extensive atherosclerosis of the coronary vessels. Abated epicardial pacing wires are noted. The aorta is normal in caliber, with extensive atherosclerosis identified. There is dilatation of the main pulmonary artery compatible with pulmonary arterial hypertension. Mediastinum/Nodes: There is progressive mediastinal lymphadenopathy. Largest lymph  node in the precarinal region measures 2.7 cm in short axis, previously having measured 2.1 cm. Thyroid is unremarkable. Trachea and esophagus are normal. Lungs/Pleura: There is extensive parenchymal scarring and fibrosis throughout the lungs. Mild diffuse interlobular septal thickening suggest an element of interstitial edema. There is no focal consolidation to suggest pneumonia or edema. No effusion or pneumothorax. Central airways are patent. Upper Abdomen: No acute abnormality. Musculoskeletal: No acute or destructive bony lesions. Reconstructed images demonstrate no additional findings. Stable left shoulder arthroplasty. IMPRESSION: 1. Progressive lung scarring and fibrosis. Superimposed interlobular septal thickening consistent with interstitial edema. 2. Progressive mediastinal lymphadenopathy, nonspecific. 3. Aortic Atherosclerosis (ICD10-I70.0) and Emphysema (ICD10-J43.9). 4. Extensive coronary artery atherosclerosis and mitral annular calcification. 5. Enlarged pulmonary artery consistent with pulmonary arterial hypertension. Electronically Signed   By: Randa Ngo M.D.   On: 03/20/2020 15:30   DG Chest Port 1 View  Result Date: 03/20/2020 CLINICAL DATA:  Diffuse crackles with shortness of breath EXAM: PORTABLE CHEST 1 VIEW COMPARISON:  12/11/2019 FINDINGS: Cardiomediastinal contours are stable, partially obscured by diffuse interstitial and airspace opacities. Increasing interstitial marking since the prior study. No areas of dense consolidative change. The no sign of pleural effusion. Signs of LEFT shoulder arthroplasty. Postoperative changes over the chest related to prior valvular replacement. IMPRESSION: Increasing interstitial marking since the prior study may represent superimposed edema or atypical infection, on a background of interstitial lung disease. Electronically Signed   By: Zetta Bills  M.D.   On: 03/20/2020 11:17    (Echo, Carotid, EGD, Colonoscopy, ERCP)     Subjective: Seen.  Physical exam deferred.  Patient visibly no distress.  2 family members at bedside.  Discharge Exam: Vitals:   03/25/20 2301 03/25/20 2303  BP: 94/74   Pulse: 98   Resp: 20   Temp:  (!) 97.5 F (36.4 C)  SpO2: 98%    Vitals:   03/24/20 2011 03/25/20 1012 03/25/20 2301 03/25/20 2303  BP: 105/79  94/74   Pulse: (!) 132 (!) 107 98   Resp: 18 20 20    Temp: 97.7 F (36.5 C)   (!) 97.5 F (36.4 C)  TempSrc: Oral   Oral  SpO2: 97% 93% 98%   Weight:      Height:        Physical exam deferred due to comfort measures status   The results of significant diagnostics from this hospitalization (including imaging, microbiology, ancillary and laboratory) are listed below for reference.     Microbiology: Recent Results (from the past 240 hour(s))  Blood Culture (routine x 2)     Status: None   Collection Time: 03/20/20 10:42 AM   Specimen: BLOOD  Result Value Ref Range Status   Specimen Description BLOOD RIGHT HAND  Final   Special Requests   Final    BOTTLES DRAWN AEROBIC AND ANAEROBIC Blood Culture adequate volume   Culture   Final    NO GROWTH 5 DAYS Performed at [facility], [address], [place], [place] [address]    Report Status 03/25/2020 FINAL  Final  Blood Culture (routine x 2)     Status: Abnormal   Collection Time: 03/20/20 10:42 AM   Specimen: BLOOD  Result Value Ref Range Status   Specimen Description   Final    BLOOD RIGHT FAS Performed at [facility], [address] [place], [place] [address]    Special Requests   Final    BOTTLES DRAWN AEROBIC AND ANAEROBIC Blood Culture adequate volume Performed at [facility], [address] [place], [place] [address]    Culture  Setup Time   Final    GRAM POSITIVE COCCI Organism ID to follow AEROBIC BOTTLE ONLY CRITICAL RESULT CALLED TO, READ BACK BY AND VERIFIED WITH: SCOTT HALL AT [place] 03/21/20 [place] Performed at [facility], [address] [place], [place] [address]    Culture (A)  Final    STAPHYLOCOCCUS SPECIES (COAGULASE NEGATIVE) THE SIGNIFICANCE OF ISOLATING THIS ORGANISM FROM A SINGLE SET OF BLOOD CULTURES WHEN MULTIPLE SETS ARE DRAWN IS UNCERTAIN. PLEASE NOTIFY THE MICROBIOLOGY DEPARTMENT WITHIN ONE WEEK IF SPECIATION AND SENSITIVITIES ARE REQUIRED. Performed at [facility], [place] [address] [place], [place] [address]    Report Status 03/22/2020 FINAL  Final  Urine culture     Status: None   Collection Time: 03/20/20 10:42 AM   Specimen: In/Out Cath Urine  Result Value Ref Range Status   Specimen Description   Final    IN/OUT CATH URINE Performed at [facility], [address] [place], [place] [address]    Special Requests   Final    NONE Performed at [facility], [address], [place], [place] [address]    Culture   Final    NO GROWTH Performed at [facility], [place] [address], [place],  [address]    Report Status 03/21/2020 FINAL  Final  Blood Culture ID Panel (Reflexed)  Status: Abnormal   Collection Time: 03/20/20 10:42 AM  Result Value Ref Range Status   Enterococcus species NOT DETECTED NOT DETECTED Final   Listeria monocytogenes NOT DETECTED NOT DETECTED Final   Staphylococcus species DETECTED (A) NOT DETECTED Final    Comment: Methicillin (oxacillin) susceptible coagulase negative staphylococcus. Possible blood culture contaminant (unless isolated from more than one blood culture draw or clinical case suggests pathogenicity). No antibiotic treatment is indicated for blood  culture contaminants. CRITICAL RESULT CALLED TO, READ BACK BY AND VERIFIED WITH: SCOTT HALL AT 0505 03/21/20 SDR    Staphylococcus aureus (BCID) NOT DETECTED NOT DETECTED Final   Methicillin resistance NOT DETECTED NOT DETECTED Final   Streptococcus species NOT DETECTED NOT DETECTED Final   Streptococcus agalactiae NOT DETECTED NOT DETECTED Final   Streptococcus pneumoniae NOT DETECTED  NOT DETECTED Final   Streptococcus pyogenes NOT DETECTED NOT DETECTED Final   Acinetobacter baumannii NOT DETECTED NOT DETECTED Final   Enterobacteriaceae species NOT DETECTED NOT DETECTED Final   Enterobacter cloacae complex NOT DETECTED NOT DETECTED Final   Escherichia coli NOT DETECTED NOT DETECTED Final   Klebsiella oxytoca NOT DETECTED NOT DETECTED Final   Klebsiella pneumoniae NOT DETECTED NOT DETECTED Final   Proteus species NOT DETECTED NOT DETECTED Final   Serratia marcescens NOT DETECTED NOT DETECTED Final   Haemophilus influenzae NOT DETECTED NOT DETECTED Final   Neisseria meningitidis NOT DETECTED NOT DETECTED Final   Pseudomonas aeruginosa NOT DETECTED NOT DETECTED Final   Candida albicans NOT DETECTED NOT DETECTED Final   Candida glabrata NOT DETECTED NOT DETECTED Final   Candida krusei NOT DETECTED NOT DETECTED Final   Candida parapsilosis NOT DETECTED NOT DETECTED Final   Candida tropicalis NOT DETECTED NOT DETECTED Final    Comment: Performed at [facility], [address] [place], [place] [address]  SARS Coronavirus 2 by RT PCR (hospital order, performed in [facility] hospital lab) Nasopharyngeal Nasopharyngeal Swab     Status: None   Collection Time: 03/20/20 11:29 AM   Specimen: Nasopharyngeal Swab  Result Value Ref Range Status   SARS Coronavirus 2 NEGATIVE NEGATIVE Final    Comment: (NOTE) SARS-CoV-2 target nucleic acids are NOT DETECTED. The SARS-CoV-2 RNA is generally detectable in upper and lower respiratory specimens during the acute phase of infection. The lowest concentration of SARS-CoV-2 viral copies this assay can detect is 250 copies / mL. A negative result does not preclude SARS-CoV-2 infection and should not be used as the sole basis for treatment or other patient management decisions.  A negative result may occur with improper specimen collection / handling, submission of specimen other than nasopharyngeal swab, presence of viral  mutation(s) within the areas targeted by this assay, and inadequate number of viral copies (<250 copies / mL). A negative result must be combined with clinical observations, patient history, and epidemiological information. Fact Sheet for Patients:   StrictlyIdeas.no Fact Sheet for Healthcare Providers: BankingDealers.co.za This test is not yet approved or cleared  by the [place] FDA and has been authorized for detection and/or diagnosis of SARS-CoV-2 by FDA under an Emergency Use Authorization (EUA).  This EUA will remain in effect (meaning this test can be used) for the duration of the COVID-19 declaration under Section 564(b)(1) of the Act, 21 U.S.C. section 360bbb-3(b)(1), unless the authorization is terminated or revoked sooner. Performed at [facility], [address] [place], [place] [address]      Labs: BNP (last 3 results) No results for input(s): BNP in  the last 8760 hours. Basic Metabolic Panel: Recent Labs  Lab 03/22/20 0513  NA 136  K 4.5  CL 99  CO2 27  GLUCOSE 195*  BUN 28*  CREATININE 1.14*  CALCIUM 9.4  MG 1.7   Liver Function Tests: No results for input(s): AST, ALT, ALKPHOS, BILITOT, PROT, ALBUMIN in the last 168 hours. No results for input(s): LIPASE, AMYLASE in the last 168 hours. No results for input(s): AMMONIA in the last 168 hours. CBC: Recent Labs  Lab 03/21/20 1142 03/22/20 0513  WBC 9.2 8.5  HGB 10.8* 10.9*  HCT 34.1* 33.4*  MCV 96.9 93.6  PLT 170 142*   Cardiac Enzymes: No results for input(s): CKTOTAL, CKMB, CKMBINDEX, TROPONINI in the last 168 hours. BNP: Invalid input(s): POCBNP CBG: Recent Labs  Lab 03/21/20 1235 03/21/20 2335 03/22/20 0545 03/22/20 1137  GLUCAP 185* 159* 190* 182*   D-Dimer No results for input(s): DDIMER in the last 72 hours. Hgb A1c No results for input(s): HGBA1C in the last 72 hours. Lipid Profile No results for input(s): CHOL,  HDL, LDLCALC, TRIG, CHOLHDL, LDLDIRECT in the last 72 hours. Thyroid function studies No results for input(s): TSH, T4TOTAL, T3FREE, THYROIDAB in the last 72 hours.  Invalid input(s): FREET3 Anemia work up No results for input(s): VITAMINB12, FOLATE, FERRITIN, TIBC, IRON, RETICCTPCT in the last 72 hours. Urinalysis    Component Value Date/Time   COLORURINE YELLOW (A) 03/20/2020 1042   APPEARANCEUR CLEAR (A) 03/20/2020 1042   APPEARANCEUR Clear 05/31/2012 1102   LABSPEC 1.014 03/20/2020 1042   LABSPEC 1.005 05/31/2012 1102   PHURINE 5.0 03/20/2020 1042   GLUCOSEU NEGATIVE 03/20/2020 1042   GLUCOSEU Negative 05/31/2012 1102   HGBUR NEGATIVE 03/20/2020 1042   BILIRUBINUR NEGATIVE 03/20/2020 1042   BILIRUBINUR Negative 05/31/2012 1102   KETONESUR NEGATIVE 03/20/2020 1042   PROTEINUR NEGATIVE 03/20/2020 1042   NITRITE NEGATIVE 03/20/2020 1042   LEUKOCYTESUR NEGATIVE 03/20/2020 1042   LEUKOCYTESUR 1+ 05/31/2012 1102   Sepsis Labs Invalid input(s): PROCALCITONIN,  WBC,  LACTICIDVEN Microbiology Recent Results (from the past 240 hour(s))  Blood Culture (routine x 2)     Status: None   Collection Time: 03/20/20 10:42 AM   Specimen: BLOOD  Result Value Ref Range Status   Specimen Description BLOOD RIGHT HAND  Final   Special Requests   Final    BOTTLES DRAWN AEROBIC AND ANAEROBIC Blood Culture adequate volume   Culture   Final    NO GROWTH 5 DAYS Performed at [facility], [address] [place], [place] [address]    Report Status 03/25/2020 FINAL  Final  Blood Culture (routine x 2)     Status: Abnormal   Collection Time: 03/20/20 10:42 AM   Specimen: BLOOD  Result Value Ref Range Status   Specimen Description   Final    BLOOD RIGHT FAS Performed at [facility], [address] [place], [place] [address]    Special Requests   Final    BOTTLES DRAWN AEROBIC AND ANAEROBIC Blood Culture adequate volume Performed at [facility], [address] [place], [place] [address]    Culture  Setup Time   Final    GRAM POSITIVE COCCI Organism ID to follow AEROBIC BOTTLE ONLY CRITICAL RESULT CALLED TO, READ BACK BY AND VERIFIED WITH: SCOTT HALL AT [place] 03/21/20 [place] Performed at [facility], [address] [place], [place] [address]    Culture (A)  Final    STAPHYLOCOCCUS SPECIES (COAGULASE NEGATIVE) THE SIGNIFICANCE  OF ISOLATING THIS ORGANISM FROM A SINGLE SET OF BLOOD CULTURES WHEN MULTIPLE SETS ARE DRAWN IS UNCERTAIN. PLEASE NOTIFY THE MICROBIOLOGY DEPARTMENT WITHIN ONE WEEK IF SPECIATION AND SENSITIVITIES ARE REQUIRED. Performed at [facility], [place] [address] [place], [place] [address]    Report Status 03/22/2020 FINAL  Final  Urine culture     Status: None   Collection Time: 03/20/20 10:42 AM   Specimen: In/Out Cath Urine  Result Value Ref Range Status   Specimen Description   Final    IN/OUT CATH URINE Performed at [facility], [address] [place], [place] [address]    Special Requests   Final    NONE Performed at [facility], [address], [place], [place] [address]    Culture   Final    NO GROWTH Performed at [facility], [place] [address] [place], [place] [address]    Report Status 03/21/2020 FINAL  Final  Blood Culture ID Panel (Reflexed)     Status: Abnormal   Collection Time: 03/20/20 10:42 AM  Result Value Ref Range Status   Enterococcus species NOT DETECTED NOT DETECTED Final   Listeria monocytogenes NOT DETECTED NOT DETECTED Final   Staphylococcus species DETECTED (A) NOT DETECTED Final    Comment: Methicillin (oxacillin) susceptible coagulase negative staphylococcus. Possible blood culture contaminant (unless isolated from more than one blood culture draw or clinical case suggests pathogenicity). No antibiotic treatment is indicated for blood  culture contaminants. CRITICAL RESULT CALLED TO, READ BACK BY AND VERIFIED WITH: SCOTT HALL AT 0505 03/21/20 SDR     Staphylococcus aureus (BCID) NOT DETECTED NOT DETECTED Final   Methicillin resistance NOT DETECTED NOT DETECTED Final   Streptococcus species NOT DETECTED NOT DETECTED Final   Streptococcus agalactiae NOT DETECTED NOT DETECTED Final   Streptococcus pneumoniae NOT DETECTED NOT DETECTED Final   Streptococcus pyogenes NOT DETECTED NOT DETECTED Final   Acinetobacter baumannii NOT DETECTED NOT DETECTED Final   Enterobacteriaceae species NOT DETECTED NOT DETECTED Final   Enterobacter cloacae complex NOT DETECTED NOT DETECTED Final   Escherichia coli NOT DETECTED NOT DETECTED Final   Klebsiella oxytoca NOT DETECTED NOT DETECTED Final   Klebsiella pneumoniae NOT DETECTED NOT DETECTED Final   Proteus species NOT DETECTED NOT DETECTED Final   Serratia marcescens NOT DETECTED NOT DETECTED Final   Haemophilus influenzae NOT DETECTED NOT DETECTED Final   Neisseria meningitidis NOT DETECTED NOT DETECTED Final   Pseudomonas aeruginosa NOT DETECTED NOT DETECTED Final   Candida albicans NOT DETECTED NOT DETECTED Final   Candida glabrata NOT DETECTED NOT DETECTED Final   Candida krusei NOT DETECTED NOT DETECTED Final   Candida parapsilosis NOT DETECTED NOT DETECTED Final   Candida tropicalis NOT DETECTED NOT DETECTED Final    Comment: Performed at [facility], [address] [place],  [address]  SARS Coronavirus 2 by RT PCR (hospital order, performed in [facility] hospital lab) Nasopharyngeal Nasopharyngeal Swab     Status: None   Collection Time: 03/20/20 11:29 AM   Specimen: Nasopharyngeal Swab  Result Value Ref Range Status   SARS Coronavirus 2 NEGATIVE NEGATIVE Final    Comment: (NOTE) SARS-CoV-2 target nucleic acids are NOT DETECTED. The SARS-CoV-2 RNA is generally detectable in upper and lower respiratory specimens during the acute phase of infection. The lowest concentration of SARS-CoV-2 viral copies this assay can detect is 250 copies / mL. A negative result does not  preclude SARS-CoV-2 infection and should not be used as  the sole basis for treatment or other patient management decisions.  A negative result may occur with improper specimen collection / handling, submission of specimen other than nasopharyngeal swab, presence of viral mutation(s) within the areas targeted by this assay, and inadequate number of viral copies (<250 copies / mL). A negative result must be combined with clinical observations, patient history, and epidemiological information. Fact Sheet for Patients:   StrictlyIdeas.no Fact Sheet for Healthcare Providers: BankingDealers.co.za This test is not yet approved or cleared  by the [place] FDA and has been authorized for detection and/or diagnosis of SARS-CoV-2 by FDA under an Emergency Use Authorization (EUA).  This EUA will remain in effect (meaning this test can be used) for the duration of the COVID-19 declaration under Section 564(b)(1) of the Act, 21 U.S.C. section 360bbb-3(b)(1), unless the authorization is terminated or revoked sooner. Performed at [facility], [address] [place], [place] [address]      Time coordinating discharge: Over 30 minutes  SIGNED:   Sidney Ace, MD  [facility] 03/28/2020, 10:47 AM Pager   If 7PM-7AM, please contact night-coverage

## 2020-04-25 DEATH — deceased
